# Patient Record
Sex: Male | Born: 1950 | Race: White | Hispanic: No | Marital: Married | Smoking: Former smoker
Health system: Southern US, Community
[De-identification: ages and names within clinical notes are randomized; demographics above are authoritative.]

## PROBLEM LIST (undated history)

## (undated) DIAGNOSIS — R0600 Dyspnea, unspecified: Secondary | ICD-10-CM

## (undated) DIAGNOSIS — D649 Anemia, unspecified: Secondary | ICD-10-CM

## (undated) DIAGNOSIS — D72828 Other elevated white blood cell count: Secondary | ICD-10-CM

## (undated) DIAGNOSIS — J189 Pneumonia, unspecified organism: Secondary | ICD-10-CM

## (undated) DIAGNOSIS — R911 Solitary pulmonary nodule: Secondary | ICD-10-CM

## (undated) DIAGNOSIS — Z720 Tobacco use: Secondary | ICD-10-CM

## (undated) DIAGNOSIS — J449 Chronic obstructive pulmonary disease, unspecified: Secondary | ICD-10-CM

## (undated) DIAGNOSIS — E785 Hyperlipidemia, unspecified: Secondary | ICD-10-CM

## (undated) HISTORY — PX: TONSILLECTOMY: SUR1361

---

## 1998-07-24 HISTORY — PX: BACK SURGERY: SHX140

## 2005-10-06 ENCOUNTER — Ambulatory Visit: Payer: Self-pay

## 2007-02-12 ENCOUNTER — Ambulatory Visit: Payer: Self-pay | Admitting: Surgery

## 2008-04-27 ENCOUNTER — Ambulatory Visit: Payer: Self-pay

## 2009-01-16 ENCOUNTER — Ambulatory Visit: Payer: Self-pay | Admitting: Unknown Physician Specialty

## 2009-05-06 ENCOUNTER — Ambulatory Visit: Payer: Self-pay | Admitting: Unknown Physician Specialty

## 2009-05-13 ENCOUNTER — Inpatient Hospital Stay: Payer: Self-pay | Admitting: Unknown Physician Specialty

## 2010-07-24 HISTORY — PX: NECK SURGERY: SHX720

## 2017-06-02 DIAGNOSIS — R69 Illness, unspecified: Secondary | ICD-10-CM | POA: Diagnosis not present

## 2017-06-26 DIAGNOSIS — Z0101 Encounter for examination of eyes and vision with abnormal findings: Secondary | ICD-10-CM | POA: Diagnosis not present

## 2018-05-23 DIAGNOSIS — R69 Illness, unspecified: Secondary | ICD-10-CM | POA: Diagnosis not present

## 2018-07-30 ENCOUNTER — Other Ambulatory Visit: Payer: Self-pay

## 2018-07-30 ENCOUNTER — Ambulatory Visit (INDEPENDENT_AMBULATORY_CARE_PROVIDER_SITE_OTHER): Payer: Medicare HMO

## 2018-07-30 ENCOUNTER — Ambulatory Visit
Admission: EM | Admit: 2018-07-30 | Discharge: 2018-07-30 | Disposition: A | Discharge status: Home / Self Care | Payer: Medicare HMO | Attending: Family Medicine | Admitting: Family Medicine

## 2018-07-30 DIAGNOSIS — S161XXA Strain of muscle, fascia and tendon at neck level, initial encounter: Secondary | ICD-10-CM | POA: Diagnosis not present

## 2018-07-30 DIAGNOSIS — M542 Cervicalgia: Secondary | ICD-10-CM | POA: Diagnosis not present

## 2018-07-30 DIAGNOSIS — R031 Nonspecific low blood-pressure reading: Secondary | ICD-10-CM | POA: Insufficient documentation

## 2018-07-30 DIAGNOSIS — R05 Cough: Secondary | ICD-10-CM | POA: Diagnosis not present

## 2018-07-30 LAB — COMPREHENSIVE METABOLIC PANEL
ALBUMIN: 3.9 g/dL (ref 3.5–5.0)
ALT: 11 U/L (ref 0–44)
AST: 15 U/L (ref 15–41)
Alkaline Phosphatase: 58 U/L (ref 38–126)
Anion gap: 9 (ref 5–15)
BUN: 17 mg/dL (ref 8–23)
CO2: 28 mmol/L (ref 22–32)
Calcium: 9.1 mg/dL (ref 8.9–10.3)
Chloride: 100 mmol/L (ref 98–111)
Creatinine, Ser: 1.1 mg/dL (ref 0.61–1.24)
GFR calc Af Amer: >60 mL/min (ref >60)
GFR calc non Af Amer: >60 mL/min (ref >60)
Glucose, Bld: 150 mg/dL — ABNORMAL HIGH (ref 70–99)
POTASSIUM: 3.9 mmol/L (ref 3.5–5.1)
Sodium: 137 mmol/L (ref 135–145)
Total Bilirubin: 1.1 mg/dL (ref 0.3–1.2)
Total Protein: 7.1 g/dL (ref 6.5–8.1)

## 2018-07-30 LAB — CBC WITH DIFFERENTIAL/PLATELET
Abs Immature Granulocytes: 0.03 10*3/uL (ref 0.00–0.07)
Basophils Absolute: 0.1 10*3/uL (ref 0.0–0.1)
Basophils Relative: 1 %
Eosinophils Absolute: 0.1 10*3/uL (ref 0.0–0.5)
Eosinophils Relative: 1 %
HCT: 37.1 % — ABNORMAL LOW (ref 39.0–52.0)
Hemoglobin: 12 g/dL — ABNORMAL LOW (ref 13.0–17.0)
IMMATURE GRANULOCYTES: 0 %
LYMPHS ABS: 1.5 10*3/uL (ref 0.7–4.0)
Lymphocytes Relative: 15 %
MCH: 31.3 pg (ref 26.0–34.0)
MCHC: 32.3 g/dL (ref 30.0–36.0)
MCV: 96.6 fL (ref 80.0–100.0)
Monocytes Absolute: 0.8 10*3/uL (ref 0.1–1.0)
Monocytes Relative: 8 %
NEUTROS PCT: 75 %
Neutro Abs: 7.5 10*3/uL (ref 1.7–7.7)
Platelets: 197 10*3/uL (ref 150–400)
RBC: 3.84 MIL/uL — ABNORMAL LOW (ref 4.22–5.81)
RDW: 12.7 % (ref 11.5–15.5)
WBC: 10 10*3/uL (ref 4.0–10.5)
nRBC: 0 % (ref 0.0–0.2)

## 2018-07-30 MED ORDER — KETOROLAC TROMETHAMINE 60 MG/2ML IM SOLN
60.0000 mg | Freq: Once | INTRAMUSCULAR | Status: AC
  Administered 2018-07-30: 60 mg via INTRAMUSCULAR

## 2018-07-30 MED ORDER — CYCLOBENZAPRINE HCL 10 MG PO TABS: 10.0000 mg | ORAL_TABLET | Freq: Three times a day (TID) | ORAL | 0 refills | Status: DC | PRN

## 2018-07-30 NOTE — ED Triage Notes (Signed)
Patient complains of neck pain that started 2-3 days ago. States that he thought he had slept on it wrong, decreased ROM, states that pain has continued to worsen.

## 2018-07-30 NOTE — ED Provider Notes (Addendum)
[facility]    CSN: [id number] Arrival date & time: 07/30/18  7829     History   Chief Complaint Chief Complaint  Patient presents with  . Neck Pain    HPI Luis Haney is a 68 y.o. male.   68 yo male with a c/o back neck pain for the past 2-3 days. States he woke up with it. Denies any falls or injuries, fevers, chills, chest pains, arm pain, jaw pain, shortness of breath. Pain is worse with movement or positioning of neck certain ways.   The history is provided by the patient.    History reviewed. No pertinent past medical history.  There are no active problems to display for this patient.   Past Surgical History:  Procedure Laterality Date  . BACK SURGERY  2000  . NECK SURGERY  2012       Home Medications    Prior to Admission medications   Medication Sig Start Date End Date Taking? Authorizing Provider  calcium carbonate (OS-CAL - DOSED IN MG OF ELEMENTAL CALCIUM) 1250 (500 Ca) MG tablet Take 1 tablet by mouth.   Yes [provider]  Multiple Vitamin (MULTI VITAMIN MENS PO) Take by mouth.   Yes [provider]  cyclobenzaprine (FLEXERIL) 10 MG tablet Take 1 tablet (10 mg total) by mouth 3 (three) times daily as needed for muscle spasms. 07/30/18   Norval Gable, MD    Family History History reviewed. No pertinent family history.  Social History Social History   Tobacco Use  . Smoking status: Current Every Day Smoker    Packs/day: 0.25    Types: Cigarettes  . Smokeless tobacco: Never Used  Substance Use Topics  . Alcohol use: Yes    Comment: beer occasionally  . Drug use: Not Currently     Allergies   Patient has no known allergies.   Review of Systems Review of Systems   Physical Exam Triage Vital Signs ED Triage Vitals  Enc Vitals Group     BP 07/30/18 0931 (!) 74/44     Pulse Rate 07/30/18 0931 (!) 53     Resp 07/30/18 0931 18     Temp 07/30/18 0931 97.8 F (36.6 C)     Temp Source 07/30/18 0931  Oral     SpO2 07/30/18 0931 95 %     Weight 07/30/18 0928 202 lb (91.6 kg)     Height 07/30/18 0928 6\' 1"  (1.854 m)     Head Circumference --      Peak Flow --      Pain Score 07/30/18 0928 9     Pain Loc --      Pain Edu? --      Excl. in [place]? --    No data found.  Updated Vital Signs BP (!) 86/50 (BP Location: Right Arm)   Pulse (!) 53   Temp 97.8 F (36.6 C) (Oral)   Resp 18   Ht 6\' 1"  (1.854 m)   Wt 91.6 kg   SpO2 95%   BMI 26.65 kg/m   Visual Acuity Right Eye Distance:   Left Eye Distance:   Bilateral Distance:    Right Eye Near:   Left Eye Near:    Bilateral Near:     Physical Exam Vitals signs and nursing note reviewed.  Constitutional:      General: He is not in acute distress.    Appearance: He is well-developed. He is not toxic-appearing or diaphoretic.  HENT:  Head: Normocephalic and atraumatic.     Nose: Nose normal.     Mouth/Throat:     Pharynx: Uvula midline. No oropharyngeal exudate.     Tonsils: No tonsillar abscesses.  Eyes:     General: No scleral icterus.       Right eye: No discharge.        Left eye: No discharge.  Neck:     Musculoskeletal: Normal range of motion and neck supple.     Thyroid: No thyromegaly.     Trachea: No tracheal deviation.  Cardiovascular:     Rate and Rhythm: Normal rate and regular rhythm.     Heart sounds: Normal heart sounds.  Pulmonary:     Effort: Pulmonary effort is normal. No respiratory distress.     Breath sounds: Normal breath sounds. No stridor. No wheezing, rhonchi or rales.  Chest:     Chest wall: No tenderness.  Lymphadenopathy:     Cervical: No cervical adenopathy.  Skin:    General: Skin is warm and dry.     Findings: No rash.  Neurological:     Mental Status: He is alert.      UC Treatments / Results  Labs (all labs ordered are listed, but only abnormal results are displayed) Labs Reviewed  CBC WITH DIFFERENTIAL/PLATELET - Abnormal; Notable for the following components:       Result Value   RBC 3.84 (*)    Hemoglobin 12.0 (*)    HCT 37.1 (*)    All other components within normal limits  COMPREHENSIVE METABOLIC PANEL - Abnormal; Notable for the following components:   Glucose, Bld 150 (*)    All other components within normal limits    EKG None  Radiology Dg Chest 2 View  Result Date: 07/30/2018 CLINICAL DATA:  Cough chills fatigue EXAM: CHEST - 2 VIEW COMPARISON:  05/06/2009 FINDINGS: The heart size and mediastinal contours are within normal limits. Both lungs are clear. The visualized skeletal structures are unremarkable. IMPRESSION: No active cardiopulmonary disease. Electronically Signed   By: Franchot Gallo M.D.   On: 07/30/2018 10:27   Dg Cervical Spine Complete  Result Date: 07/30/2018 CLINICAL DATA:  Neck pain, recent cold symptoms, initial encounter EXAM: CERVICAL SPINE - COMPLETE 4+ VIEW COMPARISON:  None. FINDINGS: Seven cervical segments are well visualized. Vertebral body height is well maintained. Changes of prior fusion at C5-6 and C6-7 are noted with anterior fixation. The neural foramina demonstrate mild narrowing bilaterally secondary to osteophytic change and facet hypertrophic change. No soft tissue abnormality is noted. The odontoid is within normal limits. IMPRESSION: Postsurgical and degenerative changes with mild neural foraminal narrowing. No acute abnormality noted. Electronically Signed   By: Inez Catalina M.D.   On: 07/30/2018 10:29    Procedures ED EKG Date/Time: 07/30/2018 9:28 PM Performed by: Norval Gable, MD Authorized by: Norval Gable, MD   ECG reviewed by ED Physician in the absence of a cardiologist: yes   Previous ECG:    Previous ECG:  Unavailable Interpretation:    Interpretation: normal   Rate:    ECG rate assessment: bradycardic   Rhythm:    Rhythm: sinus bradycardia   Ectopy:    Ectopy: none   QRS:    QRS axis:  Normal Conduction:    Conduction: normal   ST segments:    ST segments:  Normal T waves:     T waves: normal     (including critical care time)  Medications Ordered in UC Medications  ketorolac (TORADOL) injection 60 mg (60 mg Intramuscular Given 07/30/18 1049)    Initial Impression / Assessment and Plan / UC Course  I have reviewed the triage vital signs and the nursing notes.  Pertinent labs & imaging results that were available during my care of the patient were reviewed by me and considered in my medical decision making (see chart for details).      Final Clinical Impressions(s) / UC Diagnoses   Final diagnoses:  Acute strain of neck muscle, initial encounter  Low blood pressure reading     Discharge Instructions     Follow up with Primary Care provider this week Go to Emergency Department if symptoms worsen or new symptoms develop (reviewed)    ED Prescriptions    Medication Sig Dispense Auth. Provider   cyclobenzaprine (FLEXERIL) 10 MG tablet Take 1 tablet (10 mg total) by mouth 3 (three) times daily as needed for muscle spasms. 30 tablet Norval Gable, MD     1. Labs/ekg results and diagnoses reviewed with patient; given toradol 60mg  IM with improvement of symptoms 2. rx as per orders above; reviewed possible side effects, interactions, risks and benefits  3. Follow-up prn if symptoms worsen or don't improve   Controlled Substance Prescriptions [place] Controlled Substance Registry consulted? Not Applicable   Norval Gable, MD 07/30/18 2131    Norval Gable, MD 07/30/18 2133

## 2018-07-30 NOTE — Discharge Instructions (Addendum)
Follow up with Primary Care provider this week Go to Emergency Department if symptoms worsen or new symptoms develop (reviewed)

## 2018-12-17 ENCOUNTER — Telehealth: Payer: Self-pay | Admitting: Internal Medicine

## 2018-12-17 ENCOUNTER — Other Ambulatory Visit: Payer: Medicare HMO

## 2018-12-17 DIAGNOSIS — Z20822 Contact with and (suspected) exposure to covid-19: Secondary | ICD-10-CM

## 2018-12-17 DIAGNOSIS — R6889 Other general symptoms and signs: Secondary | ICD-10-CM | POA: Diagnosis not present

## 2018-12-17 NOTE — Telephone Encounter (Signed)
Per [employer] health department

## 2018-12-18 LAB — NOVEL CORONAVIRUS, NAA: SARS-CoV-2, NAA: NOT DETECTED

## 2019-04-29 DIAGNOSIS — R69 Illness, unspecified: Secondary | ICD-10-CM | POA: Diagnosis not present

## 2019-07-25 DIAGNOSIS — Z8709 Personal history of other diseases of the respiratory system: Secondary | ICD-10-CM

## 2019-07-25 HISTORY — DX: Personal history of other diseases of the respiratory system: Z87.09

## 2020-02-26 ENCOUNTER — Other Ambulatory Visit: Payer: Self-pay

## 2020-02-26 ENCOUNTER — Ambulatory Visit
Admission: EM | Admit: 2020-02-26 | Discharge: 2020-02-26 | Disposition: A | Discharge status: Home / Self Care | Payer: Medicare HMO | Attending: Family Medicine | Admitting: Family Medicine

## 2020-02-26 DIAGNOSIS — Z23 Encounter for immunization: Secondary | ICD-10-CM | POA: Diagnosis not present

## 2020-02-26 DIAGNOSIS — S61412A Laceration without foreign body of left hand, initial encounter: Secondary | ICD-10-CM

## 2020-02-26 MED ORDER — TETANUS-DIPHTH-ACELL PERTUSSIS 5-2.5-18.5 LF-MCG/0.5 IM SUSP
0.5000 mL | Freq: Once | INTRAMUSCULAR | Status: AC
  Administered 2020-02-26: 0.5 mL via INTRAMUSCULAR

## 2020-02-26 NOTE — ED Provider Notes (Signed)
[facility]    CSN: [id number] Arrival date & time: 02/26/20  1421      History   Chief Complaint Chief Complaint  Patient presents with  . Laceration   HPI  69 year old male presents with a hand laceration.  Patient states that he was opening up a package of some touchup paint today.  He was cutting an open with a knife and inadvertently cut his left palm.  Bleeding well controlled.  Denies pain at this time.  Believes that he needs stitches.  Unsure last tetanus.  No other associated symptoms.  No other complaints.  Past Surgical History:  Procedure Laterality Date  . BACK SURGERY  2000  . NECK SURGERY  2012   Home Medications    Prior to Admission medications   Medication Sig Start Date End Date Taking? Authorizing Provider  calcium carbonate (OS-CAL - DOSED IN MG OF ELEMENTAL CALCIUM) 1250 (500 Ca) MG tablet Take 1 tablet by mouth.   Yes [provider]  Multiple Vitamin (MULTI VITAMIN MENS PO) Take by mouth.   Yes [provider]  cyclobenzaprine (FLEXERIL) 10 MG tablet Take 1 tablet (10 mg total) by mouth 3 (three) times daily as needed for muscle spasms. 07/30/18   Norval Gable, MD   Social History Social History   Tobacco Use  . Smoking status: Current Every Day Smoker    Packs/day: 0.25    Types: Cigarettes  . Smokeless tobacco: Never Used  Vaping Use  . Vaping Use: Never used  Substance Use Topics  . Alcohol use: Yes    Comment: beer occasionally  . Drug use: Not Currently     Allergies   Patient has no known allergies.   Review of Systems Review of Systems  Constitutional: Negative.   Skin: Positive for wound.   Physical Exam Triage Vital Signs ED Triage Vitals [02/26/20 1447]  Enc Vitals Group     BP 135/72     Pulse Rate 71     Resp 18     Temp 98 F (36.7 C)     Temp Source Oral     SpO2 100 %     Weight 195 lb (88.5 kg)     Height 6\' 1"  (1.854 m)     Head Circumference      Peak Flow      Pain Score  0     Pain Loc      Pain Edu?      Excl. in [place]?    Updated Vital Signs BP 135/72 (BP Location: Left Arm)   Pulse 71   Temp 98 F (36.7 C) (Oral)   Resp 18   Ht 6\' 1"  (1.854 m)   Wt 88.5 kg   SpO2 100%   BMI 25.73 kg/m   Visual Acuity Right Eye Distance:   Left Eye Distance:   Bilateral Distance:    Right Eye Near:   Left Eye Near:    Bilateral Near:     Physical Exam   UC Treatments / Results  Labs (all labs ordered are listed, but only abnormal results are displayed) Labs Reviewed - No data to display  EKG   Radiology No results found.  Procedures Laceration Repair  Date/Time: 02/26/2020 8:41 PM Performed by: Coral Spikes, DO Authorized by: Coral Spikes, DO   Consent:    Consent obtained:  Verbal   Consent given by:  Patient Anesthesia (see MAR for exact dosages):    Anesthesia method:  Local infiltration   Local anesthetic:  Lidocaine 1% WITH epi Laceration details:    Location:  Hand   Hand location:  L palm   Length (cm):  1.5 Repair type:    Repair type:  Simple Pre-procedure details:    Preparation:  Patient was prepped and draped in usual sterile fashion Exploration:    Hemostasis achieved with:  Direct pressure   Wound exploration: entire depth of wound probed and visualized     Contaminated: no   Treatment:    Area cleansed with:  Betadine   Irrigation solution:  Sterile water Skin repair:    Repair method:  Sutures   Suture size:  5-0   Suture material:  Prolene   Suture technique:  Simple interrupted   Number of sutures:  4 Approximation:    Approximation:  Close Post-procedure details:    Dressing:  Non-adherent dressing   (including critical care time)  Medications Ordered in UC Medications  Tdap (BOOSTRIX) injection 0.5 mL (0.5 mLs Intramuscular Given 02/26/20 1456)    Initial Impression / Assessment and Plan / UC Course  I have reviewed the triage vital signs and the nursing notes.  Pertinent labs & imaging results  that were available during my care of the patient were reviewed by me and considered in my medical decision making (see chart for details).    26-year-old male presents with laceration.  Repaired as above.  Sutures out in 7 days.  Supportive care.  Tetanus given today.  Final Clinical Impressions(s) / UC Diagnoses   Final diagnoses:  Laceration of left hand without foreign body, initial encounter     Discharge Instructions     Keep clean.  Sutures out in 7 days.  Take care  Dr. Lacinda Axon    ED Prescriptions    None     PDMP not reviewed this encounter.   Coral Spikes, [place] 02/26/20 2045

## 2020-02-26 NOTE — ED Triage Notes (Signed)
Patient states that he cut his left hand while opening a package with his pocket knife around 1030am.

## 2020-02-26 NOTE — Discharge Instructions (Signed)
Keep clean.  Sutures out in 7 days.  Take care  Dr. Lacinda Axon

## 2020-03-05 ENCOUNTER — Ambulatory Visit
Admission: EM | Admit: 2020-03-05 | Discharge: 2020-03-05 | Disposition: A | Discharge status: Home / Self Care | Payer: Medicare HMO

## 2020-03-05 DIAGNOSIS — Z5189 Encounter for other specified aftercare: Secondary | ICD-10-CM | POA: Diagnosis not present

## 2020-03-05 DIAGNOSIS — Z4802 Encounter for removal of sutures: Secondary | ICD-10-CM

## 2020-03-05 NOTE — ED Provider Notes (Signed)
[facility]    CSN: [id number] Arrival date & time: 03/05/20  1660      History   Chief Complaint Chief Complaint  Patient presents with  . Suture / Staple Removal    HPI Luis Haney is a 69 y.o. male.   Pt had sutures placed on 8/5 for small laceration to LT inner palm near thumb area. He states yesterday he used his mower and thought there was redness to the area and wanted to make sure no abx of infection was in the area before sutures removed. Denies any fever. Just soreness when touching.       History reviewed. No pertinent past medical history.  There are no problems to display for this patient.   Past Surgical History:  Procedure Laterality Date  . BACK SURGERY  2000  . NECK SURGERY  2012       Home Medications    Prior to Admission medications   Medication Sig Start Date End Date Taking? Authorizing Provider  calcium carbonate (OS-CAL - DOSED IN MG OF ELEMENTAL CALCIUM) 1250 (500 Ca) MG tablet Take 1 tablet by mouth.   Yes [provider]  cyclobenzaprine (FLEXERIL) 10 MG tablet Take 1 tablet (10 mg total) by mouth 3 (three) times daily as needed for muscle spasms. 07/30/18  Yes Norval Gable, MD  Multiple Vitamin (MULTI VITAMIN MENS PO) Take by mouth.   Yes [provider]    Family History History reviewed. No pertinent family history.  Social History Social History   Tobacco Use  . Smoking status: Current Every Day Smoker    Packs/day: 0.25    Types: Cigarettes  . Smokeless tobacco: Never Used  Vaping Use  . Vaping Use: Never used  Substance Use Topics  . Alcohol use: Yes    Comment: beer occasionally  . Drug use: Not Currently     Allergies   Patient has no known allergies.   Review of Systems Review of Systems  Constitutional: Negative.   Musculoskeletal: Negative.   Skin:       Sutures in hand soreness.   Neurological: Negative.      Physical Exam Triage Vital Signs ED Triage Vitals  [03/05/20 0841]  Enc Vitals Group     BP      Pulse      Resp      Temp      Temp src      SpO2      Weight      Height      Head Circumference      Peak Flow      Pain Score 2     Pain Loc      Pain Edu?      Excl. in [place]?    No data found.  Updated Vital Signs BP 111/66 (BP Location: Left Arm)   Pulse 60   Temp 98.2 F (36.8 C) (Oral)   Ht 6\' 1"  (1.854 m)   Wt 197 lb (89.4 kg)   SpO2 97%   BMI 25.99 kg/m   Visual Acuity     Physical Exam Musculoskeletal:        General: Tenderness present.     Comments: Lt inner palm 3 sutures in place. incision approximated well, no erythema, healing well.   Skin:    Comments: Incision healing well.   Neurological:     Mental Status: He is alert.      UC Treatments / Results  Labs (  all labs ordered are listed, but only abnormal results are displayed) Labs Reviewed - No data to display  EKG   Radiology No results found.  Procedures Procedures (including critical care time)  Medications Ordered in UC Medications - No data to display  Initial Impression / Assessment and Plan / UC Course  I have reviewed the triage vital signs and the nursing notes.  Pertinent labs & imaging results that were available during my care of the patient were reviewed by me and considered in my medical decision making (see chart for details).    Keep area clean.  Final Clinical Impressions(s) / UC Diagnoses   Final diagnoses:  Visit for suture removal  Visit for wound check   Discharge Instructions   None    ED Prescriptions    None     PDMP not reviewed this encounter.   Marney Setting, NP 03/05/20 [phone]

## 2020-03-05 NOTE — ED Triage Notes (Signed)
Patient in today for concerns of infected suture site. Due for suture removal today.

## 2020-03-24 DIAGNOSIS — R69 Illness, unspecified: Secondary | ICD-10-CM | POA: Diagnosis not present

## 2020-04-14 DIAGNOSIS — R69 Illness, unspecified: Secondary | ICD-10-CM | POA: Diagnosis not present

## 2020-05-26 DIAGNOSIS — R69 Illness, unspecified: Secondary | ICD-10-CM | POA: Diagnosis not present

## 2020-06-23 DIAGNOSIS — J90 Pleural effusion, not elsewhere classified: Secondary | ICD-10-CM

## 2020-06-23 HISTORY — PX: CHEST TUBE INSERTION: SHX231

## 2020-06-23 HISTORY — DX: Pleural effusion, not elsewhere classified: J90

## 2020-06-28 ENCOUNTER — Emergency Department: Payer: Medicare HMO

## 2020-06-28 ENCOUNTER — Inpatient Hospital Stay
Admission: EM | Admit: 2020-06-28 | Discharge: 2020-07-09 | DRG: 871 | Disposition: A | Discharge status: Other Acute Inpatient Hospital | Payer: Medicare HMO | Attending: Internal Medicine | Admitting: Internal Medicine

## 2020-06-28 ENCOUNTER — Other Ambulatory Visit: Payer: Self-pay

## 2020-06-28 ENCOUNTER — Encounter: Payer: Self-pay | Admitting: Emergency Medicine

## 2020-06-28 ENCOUNTER — Ambulatory Visit
Admission: EM | Admit: 2020-06-28 | Discharge: 2020-06-28 | Disposition: A | Discharge status: Other Acute Inpatient Hospital | Payer: Medicare HMO

## 2020-06-28 ENCOUNTER — Observation Stay: Payer: Medicare HMO

## 2020-06-28 DIAGNOSIS — R509 Fever, unspecified: Secondary | ICD-10-CM

## 2020-06-28 DIAGNOSIS — R918 Other nonspecific abnormal finding of lung field: Secondary | ICD-10-CM | POA: Diagnosis not present

## 2020-06-28 DIAGNOSIS — D729 Disorder of white blood cells, unspecified: Secondary | ICD-10-CM

## 2020-06-28 DIAGNOSIS — R059 Cough, unspecified: Secondary | ICD-10-CM

## 2020-06-28 DIAGNOSIS — J9601 Acute respiratory failure with hypoxia: Secondary | ICD-10-CM | POA: Diagnosis not present

## 2020-06-28 DIAGNOSIS — R000 Tachycardia, unspecified: Secondary | ICD-10-CM | POA: Diagnosis not present

## 2020-06-28 DIAGNOSIS — F17200 Nicotine dependence, unspecified, uncomplicated: Secondary | ICD-10-CM | POA: Diagnosis present

## 2020-06-28 DIAGNOSIS — A419 Sepsis, unspecified organism: Principal | ICD-10-CM | POA: Diagnosis present

## 2020-06-28 DIAGNOSIS — Z20822 Contact with and (suspected) exposure to covid-19: Secondary | ICD-10-CM | POA: Diagnosis present

## 2020-06-28 DIAGNOSIS — M545 Low back pain, unspecified: Secondary | ICD-10-CM | POA: Diagnosis not present

## 2020-06-28 DIAGNOSIS — G8929 Other chronic pain: Secondary | ICD-10-CM | POA: Diagnosis present

## 2020-06-28 DIAGNOSIS — J181 Lobar pneumonia, unspecified organism: Secondary | ICD-10-CM | POA: Diagnosis present

## 2020-06-28 DIAGNOSIS — R0602 Shortness of breath: Secondary | ICD-10-CM

## 2020-06-28 DIAGNOSIS — N179 Acute kidney failure, unspecified: Secondary | ICD-10-CM | POA: Diagnosis present

## 2020-06-28 DIAGNOSIS — G4700 Insomnia, unspecified: Secondary | ICD-10-CM | POA: Diagnosis present

## 2020-06-28 DIAGNOSIS — R5383 Other fatigue: Secondary | ICD-10-CM

## 2020-06-28 DIAGNOSIS — I959 Hypotension, unspecified: Secondary | ICD-10-CM

## 2020-06-28 DIAGNOSIS — J948 Other specified pleural conditions: Secondary | ICD-10-CM | POA: Diagnosis not present

## 2020-06-28 DIAGNOSIS — R0902 Hypoxemia: Secondary | ICD-10-CM

## 2020-06-28 DIAGNOSIS — R911 Solitary pulmonary nodule: Secondary | ICD-10-CM | POA: Diagnosis present

## 2020-06-28 DIAGNOSIS — J869 Pyothorax without fistula: Secondary | ICD-10-CM | POA: Diagnosis present

## 2020-06-28 DIAGNOSIS — R0689 Other abnormalities of breathing: Secondary | ICD-10-CM | POA: Diagnosis not present

## 2020-06-28 DIAGNOSIS — E440 Moderate protein-calorie malnutrition: Secondary | ICD-10-CM | POA: Insufficient documentation

## 2020-06-28 DIAGNOSIS — J90 Pleural effusion, not elsewhere classified: Secondary | ICD-10-CM | POA: Diagnosis not present

## 2020-06-28 DIAGNOSIS — R069 Unspecified abnormalities of breathing: Secondary | ICD-10-CM | POA: Diagnosis not present

## 2020-06-28 DIAGNOSIS — E860 Dehydration: Secondary | ICD-10-CM | POA: Diagnosis present

## 2020-06-28 DIAGNOSIS — I700 Atherosclerosis of aorta: Secondary | ICD-10-CM | POA: Diagnosis not present

## 2020-06-28 DIAGNOSIS — D72828 Other elevated white blood cell count: Secondary | ICD-10-CM

## 2020-06-28 DIAGNOSIS — R6520 Severe sepsis without septic shock: Secondary | ICD-10-CM | POA: Diagnosis present

## 2020-06-28 DIAGNOSIS — I2510 Atherosclerotic heart disease of native coronary artery without angina pectoris: Secondary | ICD-10-CM | POA: Diagnosis not present

## 2020-06-28 DIAGNOSIS — J918 Pleural effusion in other conditions classified elsewhere: Secondary | ICD-10-CM | POA: Diagnosis present

## 2020-06-28 DIAGNOSIS — J440 Chronic obstructive pulmonary disease with acute lower respiratory infection: Secondary | ICD-10-CM | POA: Diagnosis present

## 2020-06-28 DIAGNOSIS — J9811 Atelectasis: Secondary | ICD-10-CM | POA: Diagnosis not present

## 2020-06-28 DIAGNOSIS — F17210 Nicotine dependence, cigarettes, uncomplicated: Secondary | ICD-10-CM | POA: Diagnosis present

## 2020-06-28 LAB — URINALYSIS, COMPLETE (UACMP) WITH MICROSCOPIC
Bilirubin Urine: NEGATIVE
Glucose, UA: NEGATIVE mg/dL
Ketones, ur: NEGATIVE mg/dL
Leukocytes,Ua: NEGATIVE
Nitrite: NEGATIVE
Protein, ur: NEGATIVE mg/dL
Specific Gravity, Urine: 1.017 (ref 1.005–1.030)
pH: 5 (ref 5.0–8.0)

## 2020-06-28 LAB — COMPREHENSIVE METABOLIC PANEL
ALT: 11 U/L (ref 0–44)
AST: 17 U/L (ref 15–41)
Albumin: 2.8 g/dL — ABNORMAL LOW (ref 3.5–5.0)
Alkaline Phosphatase: 46 U/L (ref 38–126)
Anion gap: 12 (ref 5–15)
BUN: 55 mg/dL — ABNORMAL HIGH (ref 8–23)
CO2: 23 mmol/L (ref 22–32)
Calcium: 9 mg/dL (ref 8.9–10.3)
Chloride: 98 mmol/L (ref 98–111)
Creatinine, Ser: 3.44 mg/dL — ABNORMAL HIGH (ref 0.61–1.24)
GFR, Estimated: 18 mL/min — ABNORMAL LOW (ref >60)
Glucose, Bld: 131 mg/dL — ABNORMAL HIGH (ref 70–99)
Potassium: 4.7 mmol/L (ref 3.5–5.1)
Sodium: 133 mmol/L — ABNORMAL LOW (ref 135–145)
Total Bilirubin: 0.6 mg/dL (ref 0.3–1.2)
Total Protein: 6.4 g/dL — ABNORMAL LOW (ref 6.5–8.1)

## 2020-06-28 LAB — CBC WITH DIFFERENTIAL/PLATELET
Abs Immature Granulocytes: 0.11 10*3/uL — ABNORMAL HIGH (ref 0.00–0.07)
Basophils Absolute: 0.1 10*3/uL (ref 0.0–0.1)
Basophils Relative: 0 %
Eosinophils Absolute: 0.1 10*3/uL (ref 0.0–0.5)
Eosinophils Relative: 0 %
HCT: 35 % — ABNORMAL LOW (ref 39.0–52.0)
Hemoglobin: 11.5 g/dL — ABNORMAL LOW (ref 13.0–17.0)
Immature Granulocytes: 1 %
Lymphocytes Relative: 4 %
Lymphs Abs: 0.7 10*3/uL (ref 0.7–4.0)
MCH: 30.7 pg (ref 26.0–34.0)
MCHC: 32.9 g/dL (ref 30.0–36.0)
MCV: 93.6 fL (ref 80.0–100.0)
Monocytes Absolute: 1.8 10*3/uL — ABNORMAL HIGH (ref 0.1–1.0)
Monocytes Relative: 10 %
Neutro Abs: 14.8 10*3/uL — ABNORMAL HIGH (ref 1.7–7.7)
Neutrophils Relative %: 85 %
Platelets: 284 10*3/uL (ref 150–400)
RBC: 3.74 MIL/uL — ABNORMAL LOW (ref 4.22–5.81)
RDW: 13.4 % (ref 11.5–15.5)
Smear Review: NORMAL
WBC Morphology: INCREASED
WBC: 17.5 10*3/uL — ABNORMAL HIGH (ref 4.0–10.5)
nRBC: 0 % (ref 0.0–0.2)

## 2020-06-28 LAB — APTT: aPTT: 41 seconds — ABNORMAL HIGH (ref 24–36)

## 2020-06-28 LAB — RESP PANEL BY RT-PCR (FLU A&B, COVID) ARPGX2
Influenza A by PCR: NEGATIVE
Influenza B by PCR: NEGATIVE
SARS Coronavirus 2 by RT PCR: NEGATIVE

## 2020-06-28 LAB — PROTIME-INR
INR: 1.4 — ABNORMAL HIGH (ref 0.8–1.2)
Prothrombin Time: 16.3 seconds — ABNORMAL HIGH (ref 11.4–15.2)

## 2020-06-28 LAB — TSH: TSH: 2.96 u[IU]/mL (ref 0.350–4.500)

## 2020-06-28 LAB — PROTEIN, PLEURAL OR PERITONEAL FLUID: Total protein, fluid: 4.8 g/dL

## 2020-06-28 LAB — LACTIC ACID, PLASMA
Lactic Acid, Venous: 1.2 mmol/L (ref 0.5–1.9)
Lactic Acid, Venous: 1.5 mmol/L (ref 0.5–1.9)

## 2020-06-28 LAB — GLUCOSE, PLEURAL OR PERITONEAL FLUID: Glucose, Fluid: <20 mg/dL

## 2020-06-28 LAB — MAGNESIUM: Magnesium: 2 mg/dL (ref 1.7–2.4)

## 2020-06-28 LAB — LACTATE DEHYDROGENASE, PLEURAL OR PERITONEAL FLUID: LD, Fluid: 1970 U/L — ABNORMAL HIGH (ref 3–23)

## 2020-06-28 LAB — LACTATE DEHYDROGENASE: LDH: 116 U/L (ref 98–192)

## 2020-06-28 MED ORDER — ACETAMINOPHEN 325 MG PO TABS
325.0000 mg | ORAL_TABLET | Freq: Four times a day (QID) | ORAL | Status: DC | PRN
  Administered 2020-07-03: 325 mg via ORAL
  Filled 2020-06-28: qty 1

## 2020-06-28 MED ORDER — LIDOCAINE-EPINEPHRINE 2 %-1:100000 IJ SOLN: 20.0000 mL | Freq: Once | INTRAMUSCULAR | Status: DC

## 2020-06-28 MED ORDER — LACTATED RINGERS IV BOLUS
1000.0000 mL | Freq: Once | INTRAVENOUS | Status: AC
  Administered 2020-06-28: 1000 mL via INTRAVENOUS

## 2020-06-28 MED ORDER — SODIUM CHLORIDE 0.9 % IV SOLN
500.0000 mg | INTRAVENOUS | Status: DC
  Administered 2020-06-28 – 2020-06-30 (×3): 500 mg via INTRAVENOUS
  Filled 2020-06-28 (×3): qty 500

## 2020-06-28 MED ORDER — MIDAZOLAM HCL 2 MG/2ML IJ SOLN
INTRAMUSCULAR | Status: DC | PRN
  Administered 2020-06-28: 1 mg via INTRAVENOUS

## 2020-06-28 MED ORDER — ADULT MULTIVITAMIN W/MINERALS CH
1.0000 | ORAL_TABLET | Freq: Every day | ORAL | Status: DC
  Administered 2020-06-28 – 2020-07-09 (×12): 1 via ORAL
  Filled 2020-06-28 (×12): qty 1

## 2020-06-28 MED ORDER — FENTANYL CITRATE (PF) 100 MCG/2ML IJ SOLN
INTRAMUSCULAR | Status: AC
  Filled 2020-06-28: qty 2

## 2020-06-28 MED ORDER — MIDAZOLAM HCL 2 MG/2ML IJ SOLN
INTRAMUSCULAR | Status: AC
  Filled 2020-06-28: qty 2

## 2020-06-28 MED ORDER — FENTANYL CITRATE (PF) 100 MCG/2ML IJ SOLN
INTRAMUSCULAR | Status: DC | PRN
Start: 2020-06-28 — End: 2020-06-28
  Administered 2020-06-28: 50 ug via INTRAVENOUS

## 2020-06-28 MED ORDER — SODIUM CHLORIDE 0.9 % IV SOLN
2.0000 g | Freq: Once | INTRAVENOUS | Status: AC
  Administered 2020-06-28: 2 g via INTRAVENOUS
  Filled 2020-06-28: qty 2

## 2020-06-28 MED ORDER — ACETAMINOPHEN 500 MG PO TABS
1000.0000 mg | ORAL_TABLET | Freq: Once | ORAL | Status: AC
  Administered 2020-06-28: 1000 mg via ORAL
  Filled 2020-06-28: qty 2

## 2020-06-28 MED ORDER — VANCOMYCIN HCL IN DEXTROSE 1-5 GM/200ML-% IV SOLN
1000.0000 mg | INTRAVENOUS | Status: DC
  Administered 2020-06-29: 1000 mg via INTRAVENOUS
  Filled 2020-06-28: qty 200

## 2020-06-28 MED ORDER — ACETAMINOPHEN 650 MG RE SUPP: 325.0000 mg | Freq: Four times a day (QID) | RECTAL | Status: DC | PRN

## 2020-06-28 MED ORDER — SODIUM CHLORIDE 0.9 % IV SOLN
2.0000 g | INTRAVENOUS | Status: DC
  Administered 2020-06-29: 2 g via INTRAVENOUS
  Filled 2020-06-28: qty 0.1

## 2020-06-28 MED ORDER — VANCOMYCIN HCL 1250 MG/250ML IV SOLN
1250.0000 mg | Freq: Once | INTRAVENOUS | Status: AC
  Administered 2020-06-28: 1250 mg via INTRAVENOUS
  Filled 2020-06-28: qty 250

## 2020-06-28 MED ORDER — SODIUM CHLORIDE 0.9 % IV BOLUS
1000.0000 mL | Freq: Once | INTRAVENOUS | Status: AC
  Administered 2020-06-28: 1000 mL via INTRAVENOUS

## 2020-06-28 MED ORDER — ONDANSETRON HCL 4 MG PO TABS: 4.0000 mg | ORAL_TABLET | Freq: Four times a day (QID) | ORAL | Status: DC | PRN

## 2020-06-28 MED ORDER — HEPARIN SODIUM (PORCINE) 5000 UNIT/ML IJ SOLN
5000.0000 [IU] | Freq: Three times a day (TID) | INTRAMUSCULAR | Status: DC
  Administered 2020-06-28 – 2020-07-01 (×10): 5000 [IU] via SUBCUTANEOUS
  Filled 2020-06-28 (×10): qty 1

## 2020-06-28 MED ORDER — ONDANSETRON HCL 4 MG/2ML IJ SOLN: 4.0000 mg | Freq: Four times a day (QID) | INTRAMUSCULAR | Status: DC | PRN

## 2020-06-28 NOTE — ED Provider Notes (Signed)
[facility] Emergency Department Provider Note ____________________________________________   First MD Initiated Contact with Patient 06/28/20 [phone]     (approximate)  I have reviewed the triage vital signs and the nursing notes.  HISTORY  Chief Complaint Shortness of Breath (3 days) and Fever   HPI Luis Haney is a 69 y.o. malewho presents to the ED for evaluation of shortness of breath and fever.  Chart review indicates patient was seen at urgent care this morning with same complaints.  Noted be tachycardic, hypotensive and sats in the 80s so EMS was called for ED evaluation.  Patient reports being a lifelong smoker and currently smoking about one half PPD. Reports accidental mechanical fall that occurred 2 weeks ago where he fell from a 4 step ladder onto his right\flank.   Patient reports he has been ambulatory since then, with mild pain to his right flank that is constant and aching in nature. Patient is reporting acutely about 3 days of subjective fevers, minimally productive cough, shortness of breath and increasing pain to his right side.   No past medical history on file.  Patient Active Problem List   Diagnosis Date Noted  . Acute hypoxemic respiratory failure ([place]) 06/28/2020    Past Surgical History:  Procedure Laterality Date  . BACK SURGERY  2000  . NECK SURGERY  2012    Prior to Admission medications   Medication Sig Start Date End Date Taking? Authorizing Provider  calcium carbonate (OS-CAL - DOSED IN MG OF ELEMENTAL CALCIUM) 1250 (500 Ca) MG tablet Take 1 tablet by mouth.    [provider]  cyclobenzaprine (FLEXERIL) 10 MG tablet Take 1 tablet (10 mg total) by mouth 3 (three) times daily as needed for muscle spasms. 07/30/18   Norval Gable, MD  Multiple Vitamin (MULTI VITAMIN MENS PO) Take by mouth.    [provider]  POTASSIUM PO Take 1 tablet by mouth daily.    [provider]     Allergies Patient has no known allergies.  Family History  Problem Relation Age of Onset  . Heart disease Mother   . Other Father        "old age"    Social History Social History   Tobacco Use  . Smoking status: Current Every Day Smoker    Packs/day: 0.25    Types: Cigarettes  . Smokeless tobacco: Never Used  Vaping Use  . Vaping Use: Never used  Substance Use Topics  . Alcohol use: Yes    Comment: beer occasionally  . Drug use: Not Currently    Review of Systems  Constitutional: Positive for subjective fever/chills Eyes: No visual changes. ENT: No sore throat. Cardiovascular: Positive for chest and right-sided flank pain. Respiratory: Positive for shortness of breath and cough. Gastrointestinal: No abdominal pain.  No nausea, no vomiting.  No diarrhea.  No constipation. Genitourinary: Negative for dysuria. Musculoskeletal: Positive for back pain  Skin: Negative for rash. Neurological: Negative for headaches, focal weakness or numbness.   ____________________________________________   PHYSICAL EXAM:  VITAL SIGNS: Vitals:   06/28/20 1100 06/28/20 1200  BP: (!) 92/52 (!) 104/56  Pulse: 96 94  Resp: (!) 30 (!) 32  Temp:    SpO2: 96% 95%     Constitutional: Alert and oriented.  Sitting upright in bed and appears uncomfortable.  Obviously tachypneic, and only speaking in phrases due to this.  Warm to the touch. Eyes: Conjunctivae are normal. PERRL. EOMI. Head: Atraumatic. Nose: No congestion/rhinnorhea. Mouth/Throat: Mucous  membranes are dry.  Oropharynx non-erythematous. Neck: No stridor. No cervical spine tenderness to palpation. Cardiovascular: Tachycardic, regular rhythm. Grossly normal heart sounds.  Good peripheral circulation. Respiratory: Tachypneic to about 30 with absent breath sounds in the right.  Gastrointestinal: Soft , nondistended, nontender to palpation. No CVA tenderness. Musculoskeletal: No lower extremity tenderness nor edema.  No  joint effusions. No signs of acute trauma. Neurologic:  Normal speech and language. No gross focal neurologic deficits are appreciated. No gait instability noted. Skin:  Skin is warm, dry and intact. No rash noted. Psychiatric: Mood and affect are normal. Speech and behavior are normal.  ____________________________________________   LABS (all labs ordered are listed, but only abnormal results are displayed)  Labs Reviewed  COMPREHENSIVE METABOLIC PANEL - Abnormal; Notable for the following components:      Result Value   Sodium 133 (*)    Glucose, Bld 131 (*)    BUN 55 (*)    Creatinine, Ser 3.44 (*)    Total Protein 6.4 (*)    Albumin 2.8 (*)    GFR, Estimated 18 (*)    All other components within normal limits  CBC WITH DIFFERENTIAL/PLATELET - Abnormal; Notable for the following components:   WBC 17.5 (*)    RBC 3.74 (*)    Hemoglobin 11.5 (*)    HCT 35.0 (*)    Neutro Abs 14.8 (*)    Monocytes Absolute 1.8 (*)    Abs Immature Granulocytes 0.11 (*)    All other components within normal limits  PROTIME-INR - Abnormal; Notable for the following components:   Prothrombin Time 16.3 (*)    INR 1.4 (*)    All other components within normal limits  APTT - Abnormal; Notable for the following components:   aPTT 41 (*)    All other components within normal limits  RESP PANEL BY RT-PCR (FLU A&B, COVID) ARPGX2  URINE CULTURE  CULTURE, BLOOD (ROUTINE X 2)  CULTURE, BLOOD (ROUTINE X 2)  LACTIC ACID, PLASMA  LACTIC ACID, PLASMA  MAGNESIUM  URINALYSIS, COMPLETE (UACMP) WITH MICROSCOPIC   ____________________________________________  12 Lead EKG  Rhythm, rate of 88 bpm.  Normal axis, normal intervals.  No evidence of acute ischemia. ____________________________________________  RADIOLOGY  ED MD interpretation: 1 view CXR reviewed by me with large right-sided pleural effusion  Official radiology report(s): CT CHEST WO CONTRAST  Result Date: 06/28/2020 CLINICAL DATA:   Abnormal chest x-ray, large effusion EXAM: CT CHEST WITHOUT CONTRAST TECHNIQUE: Multidetector CT imaging of the chest was performed following the standard protocol without IV contrast. COMPARISON:  Chest x-ray earlier today FINDINGS: Cardiovascular: Calcifications in the aorta and coronary arteries. Heart is normal size. Aorta is normal caliber. Mediastinum/Nodes: No mediastinal, hilar, or axillary adenopathy. Trachea and esophagus are unremarkable. Thyroid unremarkable. Lungs/Pleura: Large right pleural effusion loculated laterally. Airspace disease throughout the right lung could reflect compressive atelectasis or pneumonia. This is most confluent in the right lower lobe. Spiculated appearing nodule in the aerated right upper lobe on image 30 measures 10 mm. No confluent opacity or effusion on the left. Upper Abdomen: Imaging into the upper abdomen demonstrates no acute findings. Musculoskeletal: Chest wall soft tissues are unremarkable. No acute bony abnormality. IMPRESSION: Large loculated right pleural effusion with compressive atelectasis versus infiltrate/pneumonia in the right lung, particularly right lower lobe. Spiculated 10 mm right upper lobe nodule. This warrants further investigation. Consider PET CT after clearance of acute symptoms for further evaluation. Coronary artery disease. Aortic Atherosclerosis (ICD10-I70.0). Electronically Signed  By: Rolm Baptise M.D.   On: 06/28/2020 11:47   DG Chest Port 1 View  Result Date: 06/28/2020 CLINICAL DATA:  Cough, right-sided back pain EXAM: PORTABLE CHEST 1 VIEW COMPARISON:  January 2020 FINDINGS: New large right pleural effusion with associated atelectasis. Left lung is clear. No pneumothorax. Heart size is normal. Mild leftward mediastinal shift. Cervicothoracic fusion. IMPRESSION: New large right pleural effusion with associated atelectasis. Underlying pneumonia is not excluded. Electronically Signed   By: Macy Mis M.D.   On: 06/28/2020 10:09     ____________________________________________   PROCEDURES and INTERVENTIONS  Procedure(s) performed (including Critical Care):  .1-3 Lead EKG Interpretation Performed by: Vladimir Crofts, MD Authorized by: Vladimir Crofts, MD     Interpretation: abnormal     ECG rate:  108   ECG rate assessment: tachycardic     Rhythm: sinus tachycardia     Ectopy: none     Conduction: normal   .Critical Care Performed by: Vladimir Crofts, MD Authorized by: Vladimir Crofts, MD   Critical care provider statement:    Critical care time (minutes):  45   Critical care was necessary to treat or prevent imminent or life-threatening deterioration of the following conditions:  Sepsis, respiratory failure and dehydration   Critical care was time spent personally by me on the following activities:  Discussions with consultants, evaluation of patient's response to treatment, examination of patient, ordering and performing treatments and interventions, ordering and review of laboratory studies, ordering and review of radiographic studies, pulse oximetry, re-evaluation of patient's condition, obtaining history from patient or surrogate and review of old charts    Medications  lactated ringers bolus 1,000 mL (1,000 mLs Intravenous Incomplete 06/28/20 0955)  vancomycin (VANCOREADY) IVPB 1250 mg/250 mL (1,250 mg Intravenous New Bag/Given 06/28/20 1238)  midazolam (VERSED) 2 MG/2ML injection (has no administration in time range)  fentaNYL (SUBLIMAZE) 100 MCG/2ML injection (has no administration in time range)  acetaminophen (TYLENOL) tablet 1,000 mg (1,000 mg Oral Given 06/28/20 1105)  lactated ringers bolus 1,000 mL (1,000 mLs Intravenous New Bag/Given 06/28/20 1111)  ceFEPIme (MAXIPIME) 2 g in sodium chloride 0.9 % 100 mL IVPB (0 g Intravenous Stopped 06/28/20 1222)    ____________________________________________   MDM / ED COURSE   69 year old smoker presents from home with evidence of sepsis due to loculated  right-sided pleural effusion and pneumonia requiring medical admission and IR tube thoracostomy.  Patient febrile with EMS and presents to the ED tachycardic with soft pressures, required nasal cannula for normoxia.  His blood pressure responsive to fluids and he does not require pressors.  Exam demonstrates an uncomfortable atropine patient to his tachypnea and respiratory status, but otherwise has no evidence of acute pathology.  Blood work with leukocytosis, but no lactic acidosis, and evidence of prerenal azotemia AKI.  Patient was provided 2 L of LR to treat this with improvement of his hemodynamics.  Cultures were drawn and patient was provided broad-spectrum antibiotics.  I spoke with IR, who plans to perform diagnostic and therapeutic image guided tube thoracostomy and patient will be admitted to hospitalist medicine for further work-up and management.   Clinical Course as of Jun 28 1318  Mon Jun 28, 2020  3500 X-ray technician pulls me aside in the hallway and shows me the x-ray on the portable monitor.  Large right-sided effusion.     [DS]  [id number].  Fluids ongoing.  Maps improving.  Educated nurse and patient on plan.   [DS]    Clinical Course User  Index [DS] Vladimir Crofts, MD    ____________________________________________   FINAL CLINICAL IMPRESSION(S) / ED DIAGNOSES  Final diagnoses:  Loculated pleural effusion  Hypoxia  Fever, unspecified fever cause  Shortness of breath  AKI (acute kidney injury) [facility])     ED Discharge Orders    None       Haja Crego   Note:  This document was prepared using Dragon voice recognition software and may include unintentional dictation errors.   Vladimir Crofts, MD 06/28/20 1320

## 2020-06-28 NOTE — ED Triage Notes (Signed)
Patient is being discharged from the Urgent Care and sent to the Emergency Department via EMS . Per Christene Slates, NP, patient is in need of higher level of care due to hypoxia. Patient is aware and verbalizes understanding of plan of care.  Vitals:   06/28/20 0840 06/28/20 0843  BP:    Pulse:    Resp:    Temp:    SpO2: 91% 95%

## 2020-06-28 NOTE — Consult Note (Signed)
Chief Complaint: Right sided loculated pleural effusion. Request is for right sided chest tube placement  Referring Physician(s): Dr. Charlsie Quest  Supervising Physician: Jacqulynn Cadet  Patient Status: [facility] - ED  History of Present Illness: Luis Haney is a 69 y.o. male Smoker. No relative medical history. Presented to Urgent Care for cough and fatigue X 2-3 days and right sided back pain 2-3 weeks related to a fall. Transferred to the ED when he was found to be hypoxic with oxygen saturation of  86% on room air and hypotensive.  CT chest from 12.6. reads Large loculated right pleural effusion with compressive atelectasis versus infiltrate/pneumonia in the right lung, particularly right lower lobe and a RUL spiculated nodule. Team is requesting a right sided chest tube placement.   Patient alert and laying in bed, calm and comfortable.Reporting fatigue with cough. Denies any fevers, headache, chest pain, SOB, abdominal pain, nausea, vomiting or bleeding. Return precautions and treatment recommendations and follow-up discussed with the patient who is agreeable with the plan.    No past medical history on file.  Past Surgical History:  Procedure Laterality Date  . BACK SURGERY  2000  . NECK SURGERY  2012    Allergies: Patient has no known allergies.  Medications: Prior to Admission medications   Medication Sig Start Date End Date Taking? Authorizing Provider  calcium carbonate (OS-CAL - DOSED IN MG OF ELEMENTAL CALCIUM) 1250 (500 Ca) MG tablet Take 1 tablet by mouth.    [provider]  cyclobenzaprine (FLEXERIL) 10 MG tablet Take 1 tablet (10 mg total) by mouth 3 (three) times daily as needed for muscle spasms. 07/30/18   Norval Gable, MD  Multiple Vitamin (MULTI VITAMIN MENS PO) Take by mouth.    [provider]  POTASSIUM PO Take 1 tablet by mouth daily.    [provider]     Family History  Problem Relation Age of Onset  . Heart disease  Mother   . Other Father        "old age"    Social History   Socioeconomic History  . Marital status: Married    Spouse name: Not on file  . Number of children: Not on file  . Years of education: Not on file  . Highest education level: Not on file  Occupational History  . Not on file  Tobacco Use  . Smoking status: Current Every Day Smoker    Packs/day: 0.25    Types: Cigarettes  . Smokeless tobacco: Never Used  Vaping Use  . Vaping Use: Never used  Substance and Sexual Activity  . Alcohol use: Yes    Comment: beer occasionally  . Drug use: Not Currently  . Sexual activity: Not on file  Other Topics Concern  . Not on file  Social History Narrative  . Not on file   Social Determinants of Health   Financial Resource Strain:   . Difficulty of Paying Living Expenses: Not on file  Food Insecurity:   . Worried About Charity fundraiser in the Last Year: Not on file  . Ran Out of Food in the Last Year: Not on file  Transportation Needs:   . Lack of Transportation (Medical): Not on file  . Lack of Transportation (Non-Medical): Not on file  Physical Activity:   . Days of Exercise per Week: Not on file  . Minutes of Exercise per Session: Not on file  Stress:   . Feeling of Stress : Not on file  Social Connections:   . Frequency of Communication with Friends and Family: Not on file  . Frequency of Social Gatherings with Friends and Family: Not on file  . Attends Religious Services: Not on file  . Active Member of Clubs or Organizations: Not on file  . Attends Archivist Meetings: Not on file  . Marital Status: Not on file     Review of Systems: A 12 point ROS discussed and pertinent positives are indicated in the HPI above.  All other systems are negative.  Review of Systems  Constitutional: Positive for fatigue. Negative for fever.  HENT: Negative for congestion.   Respiratory: Positive for cough. Negative for shortness of breath.   Cardiovascular:  Negative for chest pain.  Gastrointestinal: Negative for abdominal pain.  Neurological: Negative for headaches.  Psychiatric/Behavioral: Negative for behavioral problems and confusion.    Vital Signs: BP (!) 104/56   Pulse 94   Temp 99.3 F (37.4 C) (Oral)   Resp (!) 32   Ht 6\' 1"  (1.854 m)   Wt 200 lb (90.7 kg)   SpO2 95%   BMI 26.39 kg/m   Physical Exam Vitals and nursing note reviewed.  Constitutional:      Appearance: He is well-developed.  HENT:     Head: Normocephalic.  Cardiovascular:     Rate and Rhythm: Normal rate and regular rhythm.  Pulmonary:     Effort: Tachypnea present.     Breath sounds: Normal breath sounds.  Musculoskeletal:        General: Normal range of motion.     Cervical back: Normal range of motion.  Skin:    General: Skin is dry.  Neurological:     Mental Status: He is alert and oriented to person, place, and time.     Imaging: CT CHEST WO CONTRAST  Result Date: 06/28/2020 CLINICAL DATA:  Abnormal chest x-ray, large effusion EXAM: CT CHEST WITHOUT CONTRAST TECHNIQUE: Multidetector CT imaging of the chest was performed following the standard protocol without IV contrast. COMPARISON:  Chest x-ray earlier today FINDINGS: Cardiovascular: Calcifications in the aorta and coronary arteries. Heart is normal size. Aorta is normal caliber. Mediastinum/Nodes: No mediastinal, hilar, or axillary adenopathy. Trachea and esophagus are unremarkable. Thyroid unremarkable. Lungs/Pleura: Large right pleural effusion loculated laterally. Airspace disease throughout the right lung could reflect compressive atelectasis or pneumonia. This is most confluent in the right lower lobe. Spiculated appearing nodule in the aerated right upper lobe on image 30 measures 10 mm. No confluent opacity or effusion on the left. Upper Abdomen: Imaging into the upper abdomen demonstrates no acute findings. Musculoskeletal: Chest wall soft tissues are unremarkable. No acute bony  abnormality. IMPRESSION: Large loculated right pleural effusion with compressive atelectasis versus infiltrate/pneumonia in the right lung, particularly right lower lobe. Spiculated 10 mm right upper lobe nodule. This warrants further investigation. Consider PET CT after clearance of acute symptoms for further evaluation. Coronary artery disease. Aortic Atherosclerosis (ICD10-I70.0). Electronically Signed   By: Rolm Baptise M.D.   On: 06/28/2020 11:47   DG Chest Port 1 View  Result Date: 06/28/2020 CLINICAL DATA:  Cough, right-sided back pain EXAM: PORTABLE CHEST 1 VIEW COMPARISON:  January 2020 FINDINGS: New large right pleural effusion with associated atelectasis. Left lung is clear. No pneumothorax. Heart size is normal. Mild leftward mediastinal shift. Cervicothoracic fusion. IMPRESSION: New large right pleural effusion with associated atelectasis. Underlying pneumonia is not excluded. Electronically Signed   By: Macy Mis M.D.   On: 06/28/2020 10:09  Labs:  CBC: Recent Labs    06/28/20 0952  WBC 17.5*  HGB 11.5*  HCT 35.0*  PLT 284    COAGS: Recent Labs    06/28/20 0952  INR 1.4*  APTT 41*    BMP: Recent Labs    06/28/20 0952  NA 133*  K 4.7  CL 98  CO2 23  GLUCOSE 131*  BUN 55*  CALCIUM 9.0  CREATININE 3.44*  GFRNONAA 18*    LIVER FUNCTION TESTS: Recent Labs    06/28/20 0952  BILITOT 0.6  AST 17  ALT 11  ALKPHOS 46  PROT 6.4*  ALBUMIN 2.8*    TUMOR MARKERS: No results for input(s): AFPTM, CEA, CA199, CHROMGRNA in the last 8760 hours.  Assessment and Plan:  69 y.o. male inpatient (in ED). Smoker. No relative medical history. Presented to Urgent Care for cough and fatigue X 2-3 days and right sided back pain 2-3 weeks related to a fall. Transferred to the ED when he was found to be hypoxic with oxygen saturation of  86% on room air and hypotensive.  CT chest from 12.6. reads Large loculated right pleural effusion with compressive atelectasis  versus infiltrate/pneumonia in the right lung, particularly right lower lobe and a RUL spiculated nodule. Team is requesting a right sided chest tube placement.   COVID -, WBC is 17.5, BUN 55, Cr 3.44. All other labs and medications are within acceptable parameters. Temperature 99.3.NKDA. Patient has been NPO since midnight.   IR consulted for possible right sided chest tube placement. Case has been reviewed and procedure approved by Dr. Laurence Ferrari.  Patient tentatively scheduled for 12.6.21.  Team instructed to: Keep Patient to be NPO  IR will call patient when ready.  Risks and benefits discussed with the patient including bleeding, infection, damage to adjacent structures, bowel perforation/fistula connection, and sepsis.  All of the patient's questions were answered, patient is agreeable to proceed. Consent signed and given to IR RN.     Thank you for this interesting consult.  I greatly enjoyed meeting Luis Haney and look forward to participating in their care.  A copy of this report was sent to the requesting provider on this date.  Electronically Signed: Jacqualine Mau, NP 06/28/2020, 12:40 PM   I spent a total of 40 Minutes    in face to face in clinical consultation, greater than 50% of which was counseling/coordinating care for right sided chest tube placement.

## 2020-06-28 NOTE — Progress Notes (Signed)
Pharmacy Antibiotic Note  Luis Haney is a 69 y.o. male admitted on 06/28/2020 with pneumonia and sepsis.  Pharmacy has been consulted for Cefepime and Vancomycin dosing.  Plan: Vancomycin 1250mg  IV times 1 given in ED, will continue with Vancomycin 1000mg  IV q24h.   Cefepime 2g IV q24h  Follow renal function  Height: 6\' 1"  (185.4 cm) Weight: 90.7 kg (200 lb) IBW/kg (Calculated) : 79.9  Temp (24hrs), Avg:99.1 F (37.3 C), Min:98.9 F (37.2 C), Max:99.3 F (37.4 C)  Recent Labs  Lab 06/28/20 0952 06/28/20 1229  WBC 17.5*  --   CREATININE 3.44*  --   LATICACIDVEN 1.2 1.5    Estimated Creatinine Clearance: 22.3 mL/min (A) (by C-G formula based on SCr of 3.44 mg/dL (H)).    No Known Allergies  Antimicrobials this admission: Cefepime 12/6 >>  Vancomycin 12/6 >>  Azithromycin 12/6 >>  Dose adjustments this admission:  Microbiology results:  Thank you for allowing pharmacy to be a part of this patient's care.  Paulina Fusi, PharmD, BCPS 06/28/2020 4:22 PM

## 2020-06-28 NOTE — Progress Notes (Signed)
Patient clinically stable post CT placement per DR Laurence Ferrari, tolerated well, 1584ml serous fluid removed, sats improving. Report given to Alf RN in ER post procedure/recovery with questions answered,16FR catheter placed.received Versed 2mg  along with Fentanyl 185mcg IV for procedure. Vitals stable pre and post procedure.

## 2020-06-28 NOTE — ED Provider Notes (Signed)
[facility]    CSN: [id number] Arrival date & time: 06/28/20  0810      History   Chief Complaint Chief Complaint  Patient presents with  . Cough  . Back Pain  . Fatigue    HPI Luis Haney is a 69 y.o. male presenting for 2 to 3-day history of cough that is occasionally productive, fatigue/weakness, nasal congestion/runny nose, and shortness of breath on ambulation.  He denies any shortness of breath at rest.  He denies any fever.  Denies any chest pain or pain on breathing.  No dizziness.  Denies any leg/calf pain or swelling.  He denies any history of COVID-19 or influenza exposure.  He has been vaccinated for COVID-19.  Denies being around any sick contacts.  Patient denies any cardiopulmonary diseases.  No history of DVT or PE.  He is a smoker of about a half a pack per day for many years.  He does have a history of back surgery many years ago.  Patient states that about 2 to 3 weeks ago he fell from a ladder when he was try to grab something.  He states that he has had pain of the right lower back since.  He has tried using a heating pad without relief.  He denies any associated urinary symptoms or blood in the urine.  He denies any other complaints or concerns today.  HPI  History reviewed. No pertinent past medical history.  There are no problems to display for this patient.   Past Surgical History:  Procedure Laterality Date  . BACK SURGERY  2000  . NECK SURGERY  2012       Home Medications    Prior to Admission medications   Medication Sig Start Date End Date Taking? Authorizing Provider  calcium carbonate (OS-CAL - DOSED IN MG OF ELEMENTAL CALCIUM) 1250 (500 Ca) MG tablet Take 1 tablet by mouth.   Yes [provider]  Multiple Vitamin (MULTI VITAMIN MENS PO) Take by mouth.   Yes [provider]  POTASSIUM PO Take 1 tablet by mouth daily.   Yes [provider]  cyclobenzaprine (FLEXERIL) 10 MG tablet Take 1 tablet (10 mg  total) by mouth 3 (three) times daily as needed for muscle spasms. 07/30/18   Norval Gable, MD    Family History Family History  Problem Relation Age of Onset  . Heart disease Mother   . Other Father        "old age"    Social History Social History   Tobacco Use  . Smoking status: Current Every Day Smoker    Packs/day: 0.25    Types: Cigarettes  . Smokeless tobacco: Never Used  Vaping Use  . Vaping Use: Never used  Substance Use Topics  . Alcohol use: Yes    Comment: beer occasionally  . Drug use: Not Currently     Allergies   Patient has no known allergies.   Review of Systems Review of Systems  Constitutional: Positive for diaphoresis and fatigue. Negative for fever.  HENT: Positive for congestion and rhinorrhea. Negative for sore throat.   Respiratory: Positive for cough and shortness of breath. Negative for chest tightness and wheezing.   Cardiovascular: Negative for chest pain, palpitations and leg swelling.  Gastrointestinal: Negative for abdominal pain, diarrhea, nausea and vomiting.  Genitourinary: Positive for flank pain. Negative for dysuria.  Musculoskeletal: Positive for back pain. Negative for myalgias.  Neurological: Positive for weakness. Negative for light-headedness and headaches.  Physical Exam Triage Vital Signs ED Triage Vitals [06/28/20 0830]  Enc Vitals Group     BP (!) 81/52     Pulse Rate (!) 127     Resp (!) 40     Temp 98.9 F (37.2 C)     Temp Source Oral     SpO2 (!) 86 %     Weight 200 lb (90.7 kg)     Height 6\' 1"  (1.854 m)     Head Circumference      Peak Flow      Pain Score 5     Pain Loc      Pain Edu?      Excl. in [place]?    No data found.  Updated Vital Signs BP (!) 88/55 (BP Location: Right Arm)   Pulse (!) 127   Temp 98.9 F (37.2 C) (Oral)   Resp (!) 40   Ht 6\' 1"  (1.854 m)   Wt 200 lb (90.7 kg)   SpO2 95%   BMI 26.39 kg/m       Physical Exam Vitals and nursing note reviewed.  Constitutional:       General: He is not in acute distress.    Appearance: Normal appearance. He is well-developed. He is not ill-appearing, toxic-appearing or diaphoretic.  HENT:     Head: Normocephalic and atraumatic.     Nose: Nose normal. No rhinorrhea.     Mouth/Throat:     Pharynx: Uvula midline. No oropharyngeal exudate.     Tonsils: No tonsillar abscesses.  Eyes:     General: No scleral icterus.       Right eye: No discharge.        Left eye: No discharge.     Conjunctiva/sclera: Conjunctivae normal.  Neck:     Thyroid: No thyromegaly.     Trachea: No tracheal deviation.  Cardiovascular:     Rate and Rhythm: Regular rhythm. Tachycardia present.     Heart sounds: Normal heart sounds.  Pulmonary:     Effort: Respiratory distress (increased RR, difficulty speaking in full sentences) present.     Breath sounds: Wheezing (diffuse rhonchi and wheezing throughout left lung) and rhonchi present. No rales.  Abdominal:     Tenderness: There is no right CVA tenderness or left CVA tenderness.  Musculoskeletal:     Cervical back: Neck supple.     Right lower leg: No edema.     Left lower leg: No edema.     Comments: No calf tenderness  Lymphadenopathy:     Cervical: No cervical adenopathy.  Skin:    General: Skin is warm and dry.     Findings: No rash.  Neurological:     General: No focal deficit present.     Mental Status: He is alert and oriented to person, place, and time. Mental status is at baseline.     Motor: No weakness.     Gait: Gait normal.  Psychiatric:        Mood and Affect: Mood normal.        Behavior: Behavior normal.        Thought Content: Thought content normal.      UC Treatments / Results  Labs (all labs ordered are listed, but only abnormal results are displayed) Labs Reviewed - No data to display  EKG   Radiology No results found.  Procedures Procedures (including critical care time)  Medications Ordered in UC Medications - No data to display  Initial  Impression /  Assessment and Plan / UC Course  I have reviewed the triage vital signs and the nursing notes.  Pertinent labs & imaging results that were available during my care of the patient were reviewed by me and considered in my medical decision making (see chart for details).   69 year old male presenting for 2 to 3-day history of cough and increased shortness of breath.  Upon arrival, oxygen saturation 86% and heart rate in the 120s.  Blood pressure low at 88/55.  Respiratory rate increased to 40.  Patient does appear to be short of breath and has difficulty speaking in full sentences.  He is afebrile.  Patient placed on 10 L nonrebreather oxygen increased to 95%.  We tried 3 to 4 L on nasal cannula but suddenly increased oxygen to 92%.  Diffuse mild/moderate wheezing and rhonchi of the left lung.  EMS called for transport to ED given his vital signs and respiratory distress.  IV (left AC) started and fluids began as well.  Report given to EMS and patient being transported at this time in stable condition.   Final Clinical Impressions(s) / UC Diagnoses   Final diagnoses:  Acute respiratory failure with hypoxia (HCC)  Cough  Fatigue, unspecified type  Acute right-sided low back pain without sciatica     Discharge Instructions     You have been advised to follow up immediately in the emergency department for concerning signs.symptoms. If you declined EMS transport, please have a family member take you directly to the ED at this time. Do not delay. Based on concerns about condition, if you do not follow up in th e ED, you may risk poor outcomes including worsening of condition, delayed treatment and potentially life threatening issues. If you have declined to go to the ED at this time, you should call your PCP immediately to set up a follow up appointment.  Go to ED for red flag symptoms, including; fevers you cannot reduce with Tylenol/Motrin, severe headaches, vision changes,  numbness/weakness in part of the body, lethargy, confusion, intractable vomiting, severe dehydration, chest pain, breathing difficulty, severe persistent abdominal or pelvic pain, signs of severe infection (increased redness, swelling of an area), feeling faint or passing out, dizziness, etc. You should especially go to the ED for sudden acute worsening of condition if you do not elect to go at this time.     ED Prescriptions    None     PDMP not reviewed this encounter.   Danton Clap, PA-C 06/28/20 1346

## 2020-06-28 NOTE — ED Notes (Signed)
1700 noted in pleurovac, site marked for 1930.  Assisted pt up to bedside commode for bowel movement.

## 2020-06-28 NOTE — H&P (Signed)
History and Physical   Luis Haney NKN:[id number] DOB: 11-04-1950 DOA: 06/28/2020  PCP: [facility]: [facility] Patient coming from: home  I have personally briefly reviewed patient's old medical records in [place].  Chief Concern: Back pain  HPI: Luis Haney is a 69 y.o. male with medical history significant for left lower back pain, history of right rotator cuff tendinitis, COPD, erectile dysfunction, presented to the emergency department for chief concerns of back pain.  He reports the back pain is new and started about two weeks ago, 5/10, nagging pain.  He reports he normally has left lower back pain that is chronic.  He endorses weight gained of 3 pounds over the holidays.  He endorses that he smokes 1/2 ppd, and at peak he smoked 1.5 ppd, started at age 28.   He endorsed shortness of breath that started 2 days ago when he was sitting around and is worse with with laying on rigjht side now.  ROS was negative for headache, nausea, vomiting, fever, chills, cough, chest pain, abdominal pain, dysuria, hematuria, swelling in legs, dysphagia, diarrhea, blood in his stools.  Social history: Lives with his wife.  Endorses tobacco use as above.  Endorses infrequent EtOH use.  Denies recreational drug use.  Vaccinations: he has received both [place] vaccinations   ED Course: Discussed with ED provider, requesting admission for new right large pleural effusion with associated atelectasis.  Vital signs in the ED was afebrile, increased respiration rate.  Heart rate stable at bedside.  Blood pressure appropriate map.  ED provider ordered a CT of the chest without contrast that showed large loculated right pleural effusion with compressive atelectasis particularly at the right lower lobe.  Possible infiltrate and pneumonia of the right lung.  A spiculated 10 mm right upper lobe nodule.  ED  provider consulted IR and patient is taken to CT-guided drainage percutaneous of the right lung.  Review of Systems: As per HPI otherwise 10 point review of systems negative.  Assessment/Plan  Principal Problem:   Pleural effusion, right Active Problems:   Mass of upper lobe of right lung   Tobacco use disorder, moderate, dependence   Neutrophilic leukocytosis   Loculated pleural effusion   Lobar pneumonia (HCC)   Right pleural effusion-IR consulted for IR drainage CT-guided -Cultures ordered including anaerobic/aerobic, body fluid culture, glucose of the pleural, LDH of the pleural, protein, surgical pathology  Possible lobar pneumonia on CT Leukocytosis -Status post Vanco and cefepime IV per ED provider -CBC in the a.m.  Severe sepsis with suspected source of Pneumonia with organ dysfunction, renal -Status post 1 L LR bolus and additional 1 L LR bolus in progress -Continue Vanco, cefepime -Added azithromycin for atypical coverage   Acute kidney injury -Serum creatinine on 07/30/2018 was 1.1 -Serum creatinine on ED presentation was 3.44 -Status post 1 L LR bolus  Tobacco use - counseling given  Chart reviewed.   DVT prophylaxis: Heparin subcu Code Status: full code  Diet: Heart healthy diet Family Communication: Updated spouse, Mrs. Theadore Blunck over the phone Disposition Plan: Pending clinical course Consults called: Interventional radiology Admission status: Observation to progressive cardiac  No past medical history on file.  Past Surgical History:  Procedure Laterality Date  . BACK SURGERY  2000  . NECK SURGERY  2012   Social History:  reports that he has been smoking cigarettes. He has been smoking about 0.25 packs per day. He has never  used smokeless tobacco. He reports current alcohol use. He reports previous drug use.  No Known Allergies Family History  Problem Relation Age of Onset  . Heart disease Mother   . Other Father        "old age"    Family history: Family history reviewed and not pertinent  Prior to Admission medications   Medication Sig Start Date End Date Taking? Authorizing Provider  calcium carbonate (OS-CAL - DOSED IN MG OF ELEMENTAL CALCIUM) 1250 (500 Ca) MG tablet Take 1 tablet by mouth.    [provider]  cyclobenzaprine (FLEXERIL) 10 MG tablet Take 1 tablet (10 mg total) by mouth 3 (three) times daily as needed for muscle spasms. 07/30/18   Norval Gable, MD  Multiple Vitamin (MULTI VITAMIN MENS PO) Take by mouth.    [provider]  POTASSIUM PO Take 1 tablet by mouth daily.    [provider]   Physical Exam: Vitals:   06/28/20 1351 06/28/20 1356 06/28/20 1400 06/28/20 1410  BP: (!) 100/56 (!) 97/57 (!) 92/57 (!) 94/55  Pulse: 81 81 80 78  Resp: (!) 27 (!) 33 14 (!) 30  Temp:      TempSrc:      SpO2: 94% 93% 93% 97%  Weight:      Height:       Constitutional: appears age-appropriate, NAD, calm, comfortable Eyes: PERRL, lids and conjunctivae normal ENMT: Mucous membranes are moist. Posterior pharynx clear of any exudate or lesions. Age-appropriate dentition.  Mild hearing loss Neck: normal, supple, no masses, no thyromegaly Respiratory: clear to auscultation bilaterally, no wheezing, no crackles. Normal respiratory effort. No accessory muscle use.  Cardiovascular: Regular rate and rhythm, no murmurs / rubs / gallops. No extremity edema. 2+ pedal pulses. No carotid bruits.  Abdomen: no tenderness, no masses palpated, no hepatosplenomegaly. Bowel sounds positive.  Musculoskeletal: no clubbing / cyanosis. No joint deformity upper and lower extremities. Good ROM, no contractures, no atrophy. Normal muscle tone.  Skin: no rashes, lesions, ulcers. No induration Neurologic: Sensation intact. Strength 5/5 in all 4.  Psychiatric: Normal judgment and insight. Alert and oriented x 3. Normal mood.   EKG: Independently reviewed, showing sinus rhythm, rate of 88, QTc 415  Chest x-ray  on Admission: Personally reviewed and I agree with radiologist reading as below.  CT CHEST WO CONTRAST  Result Date: 06/28/2020 CLINICAL DATA:  Abnormal chest x-ray, large effusion EXAM: CT CHEST WITHOUT CONTRAST TECHNIQUE: Multidetector CT imaging of the chest was performed following the standard protocol without IV contrast. COMPARISON:  Chest x-ray earlier today FINDINGS: Cardiovascular: Calcifications in the aorta and coronary arteries. Heart is normal size. Aorta is normal caliber. Mediastinum/Nodes: No mediastinal, hilar, or axillary adenopathy. Trachea and esophagus are unremarkable. Thyroid unremarkable. Lungs/Pleura: Large right pleural effusion loculated laterally. Airspace disease throughout the right lung could reflect compressive atelectasis or pneumonia. This is most confluent in the right lower lobe. Spiculated appearing nodule in the aerated right upper lobe on image 30 measures 10 mm. No confluent opacity or effusion on the left. Upper Abdomen: Imaging into the upper abdomen demonstrates no acute findings. Musculoskeletal: Chest wall soft tissues are unremarkable. No acute bony abnormality. IMPRESSION: Large loculated right pleural effusion with compressive atelectasis versus infiltrate/pneumonia in the right lung, particularly right lower lobe. Spiculated 10 mm right upper lobe nodule. This warrants further investigation. Consider PET CT after clearance of acute symptoms for further evaluation. Coronary artery disease. Aortic Atherosclerosis (ICD10-I70.0). Electronically Signed   By: Rolm Baptise  M.D.   On: 06/28/2020 11:47   DG Chest Port 1 View  Result Date: 06/28/2020 CLINICAL DATA:  Cough, right-sided back pain EXAM: PORTABLE CHEST 1 VIEW COMPARISON:  January 2020 FINDINGS: New large right pleural effusion with associated atelectasis. Left lung is clear. No pneumothorax. Heart size is normal. Mild leftward mediastinal shift. Cervicothoracic fusion. IMPRESSION: New large right pleural  effusion with associated atelectasis. Underlying pneumonia is not excluded. Electronically Signed   By: Macy Mis M.D.   On: 06/28/2020 10:09   Labs on Admission: I have personally reviewed following labs  CBC: Recent Labs  Lab 06/28/20 0952  WBC 17.5*  NEUTROABS 14.8*  HGB 11.5*  HCT 35.0*  MCV 93.6  PLT 734   Basic Metabolic Panel: Recent Labs  Lab 06/28/20 0952  NA 133*  K 4.7  CL 98  CO2 23  GLUCOSE 131*  BUN 55*  CREATININE 3.44*  CALCIUM 9.0  MG 2.0   GFR: Estimated Creatinine Clearance: 22.3 mL/min (A) (by C-G formula based on SCr of 3.44 mg/dL (H)). Liver Function Tests: Recent Labs  Lab 06/28/20 0952  AST 17  ALT 11  ALKPHOS 46  BILITOT 0.6  PROT 6.4*  ALBUMIN 2.8*   Coagulation Profile: Recent Labs  Lab 06/28/20 0952  INR 1.4*   Ameir Faria N Aarini Slee D.O. [facility]  If 12AM-7AM, please contact overnight-coverage provider If 7AM-7PM, please contact day coverage provider [email or web address]  06/28/2020, 2:22 PM

## 2020-06-28 NOTE — ED Triage Notes (Signed)
Patient in today c/o slight cough, fatigue x 2-3 days. Patient denies fever. Patient denies sob, but his respiration are 40. Patient has had covid vaccines, but concerned he may have the flu.  Patient also c/o right sided back pain x 2-3 weeks. Patient states he fell in his building 2-3 weeks ago.

## 2020-06-28 NOTE — Procedures (Signed)
Interventional Radiology Procedure Note  Procedure: Successful placement of a 1F pigtail chest tube into the right empyema yielding turbid yellow fluid.  Samples sent for culture.   Complications: None  Estimated Blood Loss: None  Recommendations: - Chest tube to LWS at 20 cm H2O - Cultures sent   Signed,  Criselda Peaches, MD

## 2020-06-28 NOTE — ED Triage Notes (Signed)
Pt came from urgent care, SOB, O2 sats 86% RA, hypotensive 88/55.    Also c/o back pain, fever.  Pt on 4L [place].

## 2020-06-28 NOTE — Discharge Instructions (Addendum)

## 2020-06-29 ENCOUNTER — Observation Stay: Payer: Medicare HMO

## 2020-06-29 DIAGNOSIS — J9601 Acute respiratory failure with hypoxia: Secondary | ICD-10-CM | POA: Diagnosis not present

## 2020-06-29 DIAGNOSIS — G4700 Insomnia, unspecified: Secondary | ICD-10-CM | POA: Diagnosis present

## 2020-06-29 DIAGNOSIS — E860 Dehydration: Secondary | ICD-10-CM | POA: Diagnosis present

## 2020-06-29 DIAGNOSIS — R69 Illness, unspecified: Secondary | ICD-10-CM | POA: Diagnosis not present

## 2020-06-29 DIAGNOSIS — J439 Emphysema, unspecified: Secondary | ICD-10-CM | POA: Diagnosis not present

## 2020-06-29 DIAGNOSIS — R918 Other nonspecific abnormal finding of lung field: Secondary | ICD-10-CM | POA: Diagnosis not present

## 2020-06-29 DIAGNOSIS — J9811 Atelectasis: Secondary | ICD-10-CM | POA: Diagnosis not present

## 2020-06-29 DIAGNOSIS — R911 Solitary pulmonary nodule: Secondary | ICD-10-CM | POA: Diagnosis present

## 2020-06-29 DIAGNOSIS — J918 Pleural effusion in other conditions classified elsewhere: Secondary | ICD-10-CM | POA: Diagnosis not present

## 2020-06-29 DIAGNOSIS — F17200 Nicotine dependence, unspecified, uncomplicated: Secondary | ICD-10-CM | POA: Diagnosis not present

## 2020-06-29 DIAGNOSIS — G8929 Other chronic pain: Secondary | ICD-10-CM | POA: Diagnosis present

## 2020-06-29 DIAGNOSIS — Z978 Presence of other specified devices: Secondary | ICD-10-CM | POA: Diagnosis not present

## 2020-06-29 DIAGNOSIS — Z20822 Contact with and (suspected) exposure to covid-19: Secondary | ICD-10-CM | POA: Diagnosis not present

## 2020-06-29 DIAGNOSIS — A419 Sepsis, unspecified organism: Secondary | ICD-10-CM | POA: Diagnosis not present

## 2020-06-29 DIAGNOSIS — J181 Lobar pneumonia, unspecified organism: Secondary | ICD-10-CM | POA: Diagnosis not present

## 2020-06-29 DIAGNOSIS — I700 Atherosclerosis of aorta: Secondary | ICD-10-CM | POA: Diagnosis not present

## 2020-06-29 DIAGNOSIS — J869 Pyothorax without fistula: Secondary | ICD-10-CM | POA: Diagnosis not present

## 2020-06-29 DIAGNOSIS — J948 Other specified pleural conditions: Secondary | ICD-10-CM | POA: Diagnosis not present

## 2020-06-29 DIAGNOSIS — F17210 Nicotine dependence, cigarettes, uncomplicated: Secondary | ICD-10-CM | POA: Diagnosis present

## 2020-06-29 DIAGNOSIS — J440 Chronic obstructive pulmonary disease with acute lower respiratory infection: Secondary | ICD-10-CM | POA: Diagnosis not present

## 2020-06-29 DIAGNOSIS — N179 Acute kidney failure, unspecified: Secondary | ICD-10-CM | POA: Diagnosis not present

## 2020-06-29 DIAGNOSIS — R059 Cough, unspecified: Secondary | ICD-10-CM | POA: Diagnosis not present

## 2020-06-29 DIAGNOSIS — R0602 Shortness of breath: Secondary | ICD-10-CM | POA: Diagnosis not present

## 2020-06-29 DIAGNOSIS — Z7689 Persons encountering health services in other specified circumstances: Secondary | ICD-10-CM | POA: Diagnosis not present

## 2020-06-29 DIAGNOSIS — R6520 Severe sepsis without septic shock: Secondary | ICD-10-CM | POA: Diagnosis present

## 2020-06-29 DIAGNOSIS — J90 Pleural effusion, not elsewhere classified: Secondary | ICD-10-CM | POA: Diagnosis not present

## 2020-06-29 LAB — CBC
HCT: 32.5 % — ABNORMAL LOW (ref 39.0–52.0)
Hemoglobin: 10.7 g/dL — ABNORMAL LOW (ref 13.0–17.0)
MCH: 31.5 pg (ref 26.0–34.0)
MCHC: 32.9 g/dL (ref 30.0–36.0)
MCV: 95.6 fL (ref 80.0–100.0)
Platelets: 268 10*3/uL (ref 150–400)
RBC: 3.4 MIL/uL — ABNORMAL LOW (ref 4.22–5.81)
RDW: 13.7 % (ref 11.5–15.5)
WBC: 11.1 10*3/uL — ABNORMAL HIGH (ref 4.0–10.5)
nRBC: 0 % (ref 0.0–0.2)

## 2020-06-29 LAB — BASIC METABOLIC PANEL
Anion gap: 11 (ref 5–15)
BUN: 56 mg/dL — ABNORMAL HIGH (ref 8–23)
CO2: 22 mmol/L (ref 22–32)
Calcium: 8.4 mg/dL — ABNORMAL LOW (ref 8.9–10.3)
Chloride: 102 mmol/L (ref 98–111)
Creatinine, Ser: 2.34 mg/dL — ABNORMAL HIGH (ref 0.61–1.24)
GFR, Estimated: 29 mL/min — ABNORMAL LOW (ref >60)
Glucose, Bld: 100 mg/dL — ABNORMAL HIGH (ref 70–99)
Potassium: 4.3 mmol/L (ref 3.5–5.1)
Sodium: 135 mmol/L (ref 135–145)

## 2020-06-29 LAB — LACTATE DEHYDROGENASE: LDH: 98 U/L (ref 98–192)

## 2020-06-29 LAB — MRSA PCR SCREENING: MRSA by PCR: NEGATIVE

## 2020-06-29 MED ORDER — SODIUM CHLORIDE 0.9 % IV SOLN
3.0000 g | Freq: Four times a day (QID) | INTRAVENOUS | Status: DC
  Administered 2020-06-29 – 2020-07-09 (×40): 3 g via INTRAVENOUS
  Filled 2020-06-29 (×2): qty 3
  Filled 2020-06-29: qty 0.86
  Filled 2020-06-29 (×5): qty 3
  Filled 2020-06-29: qty 8
  Filled 2020-06-29: qty 3
  Filled 2020-06-29: qty 0.18
  Filled 2020-06-29: qty 3
  Filled 2020-06-29 (×2): qty 8
  Filled 2020-06-29: qty 3
  Filled 2020-06-29: qty 8
  Filled 2020-06-29: qty 3
  Filled 2020-06-29: qty 0.18
  Filled 2020-06-29 (×2): qty 3
  Filled 2020-06-29 (×2): qty 8
  Filled 2020-06-29: qty 3
  Filled 2020-06-29: qty 8
  Filled 2020-06-29: qty 3
  Filled 2020-06-29 (×2): qty 8
  Filled 2020-06-29: qty 3
  Filled 2020-06-29: qty 8
  Filled 2020-06-29 (×2): qty 3
  Filled 2020-06-29: qty 8
  Filled 2020-06-29 (×3): qty 3
  Filled 2020-06-29: qty 8
  Filled 2020-06-29: qty 3
  Filled 2020-06-29: qty 8
  Filled 2020-06-29 (×2): qty 3
  Filled 2020-06-29 (×4): qty 8
  Filled 2020-06-29 (×2): qty 3

## 2020-06-29 MED ORDER — ORAL CARE MOUTH RINSE
15.0000 mL | Freq: Two times a day (BID) | OROMUCOSAL | Status: DC
  Administered 2020-06-29 – 2020-07-09 (×19): 15 mL via OROMUCOSAL

## 2020-06-29 NOTE — Progress Notes (Signed)
PROGRESS NOTE    Luis Haney  TAV:[id number] DOB: 01/03/1951 DOA: 06/28/2020 PCP: [name]   Brief Narrative: Taken from H&P Luis Haney is a 69 y.o. male with medical history significant for left lower back pain, history of right rotator cuff tendinitis, COPD, erectile dysfunction, presented to the emergency department for chief concerns of right-sided upper back pain and shortness of breath.  Patient is a current smoker.  There is an increase in his cough.  No fever or chills. CT chest with right lower lobe infiltrate/pneumonia along with a loculated infusion s/p chest tube placement by IR.  Initial labs with exudative fluid, pending culture. Also found to have a spiculated 10 mm right upper lobe nodule which will need further evaluation as an outpatient once recovered from this acute illness. Blood cultures negative so far.  Subjective: Patient continued to feel short of breath, cough and chest pain at surgical site where the chest tube was inserted.  Denies any fever or chills. Patient was worried about his wife as she had dementia and he was the caregiver, currently with a paid caregiver.  Assessment & Plan:   Principal Problem:   Pleural effusion, right Active Problems:   Mass of upper lobe of right lung   Tobacco use disorder, moderate, dependence   Neutrophilic leukocytosis   Loculated pleural effusion   Lobar pneumonia (HCC)   Empyema lung (HCC)  Severe sepsis with pneumonia and empyema.  Initially met severe sepsis criteria with tachypnea, leukocytosis and chest imaging concerning for pneumonia and empyema along with AKI.  Blood cultures negative so far. Chest tube was placed by IR, draining well, initial labs with exudative fluid which is consistent with his exudate pleural effusion.  He was started on cefepime and vancomycin.  Remained afebrile.  Leukocytosis improving. -Checking MRSA PCR-if negative can discontinue vancomycin. -Switch cefepime with  Unasyn for anaerobic coverage, we can adjust antibiotics once culture results are available. -Continue with supportive care.  AKI.  Unknown baseline, prior creatinine in the system was approximately 50-year old. Creatinine seems improving with IV fluid. -Continue with gentle IV hydration. -Monitor renal function -Avoid nephrotoxins.  COPD.  No acute exacerbation. -Continue with as needed bronchodilators.  Tobacco abuse.  Continue to smoke 1 to 1-1/2 pack/day. -Nicotine patch. -Counseling given.  Spiculated pulmonary nodule.  Will need outpatient evaluation with PET scan as recommended by radiology once acute pneumonia resolves. -Cytology was added to pleural effusion labs.  Objective: Vitals:   06/29/20 0501 06/29/20 0601 06/29/20 0821 06/29/20 1200  BP:   (!) 101/45 114/63  Pulse: 87 79 79 76  Resp: (!) _0 (!) 23  Temp:   (!) 97.5 F (36.4 C) 98.3 F (36.8 C)  TempSrc:   Oral Oral  SpO2: 96% 97% 91% 97%  Weight:      Height:        Intake/Output Summary (Last 24 hours) at 06/29/2020 1237 Last data filed at 06/29/2020 1203 Gross per 24 hour  Intake 2850 ml  Output 5080 ml  Net -2230 ml   Filed Weights   06/28/20 0948 06/28/20 2323 06/29/20 0412  Weight: 90.7 kg 94.2 kg 94.2 kg    Examination:  General exam: Appears calm and comfortable  Respiratory system: Right chest tube in place with drainage of purulent discharge, decreased breath sounds at right base, scattered rhonchi more prominent on right respiratory effort normal. Cardiovascular system: S1 & S2 heard, RRR.  Gastrointestinal system: Soft, nontender, nondistended, bowel sounds positive.  Central nervous system: Alert and oriented. No focal neurological deficits. Extremities: No edema, no cyanosis, pulses intact and symmetrical. Psychiatry: Judgement and insight appear normal. Mood & affect appropriate.    DVT prophylaxis: Heparin Code Status: Full Family Communication: Patient wife is with dementia  and the caregiver is taking care of her, discussed with patient.  No other contact listed. Disposition Plan:  Status is: Inpatient  Remains inpatient appropriate because:Inpatient level of care appropriate due to severity of illness   Dispo: The patient is from: Home              Anticipated d/c is to: Home              Anticipated d/c date is: 2 days              Patient currently is not medically stable to d/c.  Consultants:   Thoracic surgery  IR  Procedures:  Antimicrobials:  Unasyn Zithromax  Data Reviewed: I have personally reviewed following labs and imaging studies  CBC: Recent Labs  Lab 06/28/20 0952 06/29/20 0439  WBC 17.5* 11.1*  NEUTROABS 14.8*  --   HGB 11.5* 10.7*  HCT 35.0* 32.5*  MCV 93.6 95.6  PLT 284 616   Basic Metabolic Panel: Recent Labs  Lab 06/28/20 0952 06/29/20 0439  NA 133* 135  K 4.7 4.3  CL 98 102  CO2 23 22  GLUCOSE 131* 100*  BUN 55* 56*  CREATININE 3.44* 2.34*  CALCIUM 9.0 8.4*  MG 2.0  --    GFR: Estimated Creatinine Clearance: 32.7 mL/min (A) (by C-G formula based on SCr of 2.34 mg/dL (H)). Liver Function Tests: Recent Labs  Lab 06/28/20 0952  AST 17  ALT 11  ALKPHOS 46  BILITOT 0.6  PROT 6.4*  ALBUMIN 2.8*   No results for input(s): LIPASE, AMYLASE in the last 168 hours. No results for input(s): AMMONIA in the last 168 hours. Coagulation Profile: Recent Labs  Lab 06/28/20 0952  INR 1.4*   Cardiac Enzymes: No results for input(s): CKTOTAL, CKMB, CKMBINDEX, TROPONINI in the last 168 hours. BNP (last 3 results) No results for input(s): PROBNP in the last 8760 hours. HbA1C: No results for input(s): HGBA1C in the last 72 hours. CBG: No results for input(s): GLUCAP in the last 168 hours. Lipid Profile: No results for input(s): CHOL, HDL, LDLCALC, TRIG, CHOLHDL, LDLDIRECT in the last 72 hours. Thyroid Function Tests: Recent Labs    06/28/20 0952  TSH 2.960   Anemia Panel: No results for input(s):  VITAMINB12, FOLATE, FERRITIN, TIBC, IRON, RETICCTPCT in the last 72 hours. Sepsis Labs: Recent Labs  Lab 06/28/20 0737 06/28/20 1229  LATICACIDVEN 1.2 1.5    Recent Results (from the past 240 hour(s))  Resp Panel by RT-PCR (Flu A&B, Covid) Nasopharyngeal Swab     Status: None   Collection Time: 06/28/20  9:52 AM   Specimen: Nasopharyngeal Swab; Nasopharyngeal(NP) swabs in vial transport medium  Result Value Ref Range Status   SARS Coronavirus 2 by RT PCR NEGATIVE NEGATIVE Final    Comment: (NOTE) SARS-CoV-2 target nucleic acids are NOT DETECTED.  The SARS-CoV-2 RNA is generally detectable in upper respiratory specimens during the acute phase of infection. The lowest concentration of SARS-CoV-2 viral copies this assay can detect is 138 copies/mL. A negative result does not preclude SARS-Cov-2 infection and should not be used as the sole basis for treatment or other patient management decisions. A negative result may occur with  improper specimen collection/handling, submission of  specimen other than nasopharyngeal swab, presence of viral mutation(s) within the areas targeted by this assay, and inadequate number of viral copies(<138 copies/mL). A negative result must be combined with clinical observations, patient history, and epidemiological information. The expected result is Negative.  Fact Sheet for Patients:  EntrepreneurPulse.com.au  Fact Sheet for Healthcare Providers:  IncredibleEmployment.be  This test is no t yet approved or cleared by the [place] FDA and  has been authorized for detection and/or diagnosis of SARS-CoV-2 by FDA under an Emergency Use Authorization (EUA). This EUA will remain  in effect (meaning this test can be used) for the duration of the COVID-19 declaration under Section 564(b)(1) of the Act, 21 U.S.C.section 360bbb-3(b)(1), unless the authorization is terminated  or revoked sooner.       Influenza A  by PCR NEGATIVE NEGATIVE Final   Influenza B by PCR NEGATIVE NEGATIVE Final    Comment: (NOTE) The Xpert Xpress SARS-CoV-2/FLU/RSV plus assay is intended as an aid in the diagnosis of influenza from Nasopharyngeal swab specimens and should not be used as a sole basis for treatment. Nasal washings and aspirates are unacceptable for Xpert Xpress SARS-CoV-2/FLU/RSV testing.  Fact Sheet for Patients: EntrepreneurPulse.com.au  Fact Sheet for Healthcare Providers: IncredibleEmployment.be  This test is not yet approved or cleared by the [place] FDA and has been authorized for detection and/or diagnosis of SARS-CoV-2 by FDA under an Emergency Use Authorization (EUA). This EUA will remain in effect (meaning this test can be used) for the duration of the COVID-19 declaration under Section 564(b)(1) of the Act, 21 U.S.C. section 360bbb-3(b)(1), unless the authorization is terminated or revoked.  Performed at [facility], [address] [place], [place] [address]   Blood culture (routine x 2)     Status: None (Preliminary result)   Collection Time: 06/28/20  9:52 AM   Specimen: BLOOD RIGHT HAND  Result Value Ref Range Status   Specimen Description BLOOD RIGHT HAND  Final   Special Requests   Final    BOTTLES DRAWN AEROBIC AND ANAEROBIC Blood Culture adequate volume   Culture   Final    NO GROWTH < 24 HOURS Performed at [facility], [address], [place], [place] [address]    Report Status PENDING  Incomplete  Blood culture (routine x 2)     Status: None (Preliminary result)   Collection Time: 06/28/20  9:52 AM   Specimen: BLOOD RIGHT ARM  Result Value Ref Range Status   Specimen Description BLOOD RIGHT ARM  Final   Special Requests   Final    BOTTLES DRAWN AEROBIC AND ANAEROBIC Blood Culture adequate volume   Culture   Final    NO GROWTH < 24 HOURS Performed at [facility], [address],  [place], [place] [address]    Report Status PENDING  Incomplete  Body fluid culture     Status: None (Preliminary result)   Collection Time: 06/28/20  2:17 PM   Specimen: Pleura; Body Fluid  Result Value Ref Range Status   Specimen Description   Final    PLEURAL Performed at [facility], [address] [place], [place] [address]    Special Requests   Final    NONE Performed at [facility], [address] [place],  [address]    Gram Stain PENDING  Incomplete   Culture   Final    NO GROWTH < 24 HOURS Performed at [facility], [place] [address] [place], [place]  [address]    Report Status PENDING  Incomplete  Aerobic/Anaerobic Culture (surgical/deep wound)     Status: None (Preliminary result)   Collection Time: 06/28/20  2:17 PM   Specimen: Abscess  Result Value Ref Range Status   Specimen Description   Final    ABSCESS Performed at [facility], [address] [place], [place] [address]    Special Requests   Final    EMPYEMA Performed at [facility], [address] [place], [place] [address]    Gram Stain PENDING  Incomplete   Culture   Final    NO GROWTH < 24 HOURS Performed at [facility], [place] [address], [place], [place] [address]    Report Status PENDING  Incomplete     Radiology Studies: CT CHEST WO CONTRAST  Result Date: 06/28/2020 CLINICAL DATA:  Abnormal chest x-ray, large effusion EXAM: CT CHEST WITHOUT CONTRAST TECHNIQUE: Multidetector CT imaging of the chest was performed following the standard protocol without IV contrast. COMPARISON:  Chest x-ray earlier today FINDINGS: Cardiovascular: Calcifications in the aorta and coronary arteries. Heart is normal size. Aorta is normal caliber. Mediastinum/Nodes: No mediastinal, hilar, or axillary adenopathy. Trachea and esophagus are unremarkable. Thyroid unremarkable. Lungs/Pleura: Large right pleural effusion loculated laterally. Airspace disease throughout the  right lung could reflect compressive atelectasis or pneumonia. This is most confluent in the right lower lobe. Spiculated appearing nodule in the aerated right upper lobe on image 30 measures 10 mm. No confluent opacity or effusion on the left. Upper Abdomen: Imaging into the upper abdomen demonstrates no acute findings. Musculoskeletal: Chest wall soft tissues are unremarkable. No acute bony abnormality. IMPRESSION: Large loculated right pleural effusion with compressive atelectasis versus infiltrate/pneumonia in the right lung, particularly right lower lobe. Spiculated 10 mm right upper lobe nodule. This warrants further investigation. Consider PET CT after clearance of acute symptoms for further evaluation. Coronary artery disease. Aortic Atherosclerosis (ICD10-I70.0). Electronically Signed   By: Rolm Baptise M.D.   On: 06/28/2020 11:47   DG Chest Port 1 View  Result Date: 06/29/2020 CLINICAL DATA:  Shortness of breath.  Right chest tube. EXAM: PORTABLE CHEST 1 VIEW COMPARISON:  Chest radiograph and chest CT 06/28/2020. Chest radiograph 07/30/2018 FINDINGS: Pigtail drain has been placed in the right chest. Markedly improved aeration in the right lung compatible with removal of pleural fluid. There is a rounded opacity in the right lower chest that measures up to 5.0 cm. Residual densities along the periphery of the right lower chest most likely represents pleural-based densities. Negative for right pneumothorax. Left lung is clear without pneumothorax. Heart size is within normal limits and stable. The trachea is midline. Surgical hardware in the lower cervical spine. IMPRESSION: 1. Markedly improved aeration in the right lung following placement of the right chest tube. Negative for pneumothorax. 2. Large rounded opacity in the right lower chest measuring at least 5.0 cm in diameter. This rounded opacity may represent a focus of loculated pleural fluid but a mass lesion cannot be excluded although there was  no gross abnormality in this area on the study from January 2020. Recommend continued follow-up and patient will likely need follow-up chest CT. Follow-up chest CT with IV contrast would be ideal but probably not feasible due to patient's renal function. Electronically Signed   By: Markus Daft M.D.   On: 06/29/2020 09:33   DG Chest Port 1 View  Result Date: 06/28/2020 CLINICAL DATA:  Right pleural effusion. EXAM: PORTABLE CHEST 1 VIEW COMPARISON:  Same day. FINDINGS: Stable cardiomediastinal silhouette. Interval placement of pigtail drainage catheter into right hemithorax. Right pleural effusion is significantly decreased as a result. Moderate right hydropneumothorax remains. Left lung is clear. Bony thorax is unremarkable. IMPRESSION: Interval placement of pigtail drainage catheter into right hemithorax. Right pleural effusion is significantly decreased as a result. Moderate right hydropneumothorax remains. Electronically Signed   By: Marijo Conception M.D.   On: 06/28/2020 17:59   DG Chest Port 1 View  Result Date: 06/28/2020 CLINICAL DATA:  Cough, right-sided back pain EXAM: PORTABLE CHEST 1 VIEW COMPARISON:  January 2020 FINDINGS: New large right pleural effusion with associated atelectasis. Left lung is clear. No pneumothorax. Heart size is normal. Mild leftward mediastinal shift. Cervicothoracic fusion. IMPRESSION: New large right pleural effusion with associated atelectasis. Underlying pneumonia is not excluded. Electronically Signed   By: Macy Mis M.D.   On: 06/28/2020 10:09   CT IMAGE GUIDED DRAINAGE BY PERCUTANEOUS CATHETER  Result Date: 06/28/2020 INDICATION: 69 year old male with large loculated right-sided pleural effusion and evidence of bacteremia. Findings are concerning for parapneumonic effusion. EXAM: CT-guided chest tube placement MEDICATIONS: The patient is currently admitted to the hospital and receiving intravenous antibiotics. The antibiotics were administered within an  appropriate time frame prior to the initiation of the procedure. ANESTHESIA/SEDATION: Fentanyl 50 mcg IV; Versed 1 mg IV Moderate Sedation Time:  10 minutes The patient was continuously monitored during the procedure by the interventional radiology nurse under my direct supervision. COMPLICATIONS: None immediate. PROCEDURE: Informed written consent was obtained from the patient after a thorough discussion of the procedural risks, benefits and alternatives. All questions were addressed. Maximal Sterile Barrier Technique was utilized including caps, mask, sterile gowns, sterile gloves, sterile drape, hand hygiene and skin antiseptic. A timeout was performed prior to the initiation of the procedure. A planning axial CT scan was performed. The large loculated right-sided pleural effusion was identified. A suitable skin entry site and an anterior intercostal space was identified. The skin was marked. After sterile prep and drape using chlorhexidine skin prep, local anesthesia was attained by infiltration with 1% lidocaine. A small dermatotomy was made. Using trocar technique, a Cook 90 [place] all-purpose drainage catheter was advanced over the rib and into the pleural space. The catheter was connected to low wall suction via a Serra pleura vac device. Nearly 2 L of pleural fluid was successfully aspirated in the first few minutes. Follow-up CT imaging demonstrates a well-positioned chest tube. No immediate complication. Tube was secured to the skin with 0 Prolene suture. An air tight sterile dressing was applied. IMPRESSION: Successful placement of a 16 French chest tube into the loculated right pleural effusion. Evacuation yields cloudy yellow fluid. Samples were sent for Gram stain and culture. Electronically Signed   By: Jacqulynn Cadet M.D.   On: 06/28/2020 15:14    Scheduled Meds: . heparin  5,000 Units Subcutaneous Q8H  . mouth rinse  15 mL Mouth Rinse BID  . multivitamin with minerals  1 tablet Oral Daily    Continuous Infusions: . ampicillin-sulbactam (UNASYN) IV    . azithromycin Stopped (06/28/20 1732)  . vancomycin 1,000 mg (06/29/20 0835)     LOS: 0 days   Time spent: 45 minutes.  Lorella Nimrod, MD [facility]  If 7PM-7AM, please contact night-coverage [email or web address]  06/29/2020, 12:37 PM   This record has been created using Systems analyst. Errors have been sought and corrected,but may not always be located. Such creation errors do not reflect on the  standard of care.

## 2020-06-29 NOTE — Plan of Care (Signed)
  Problem: Clinical Measurements: Goal: Cardiovascular complication will be avoided Outcome: Progressing   Problem: Activity: Goal: Risk for activity intolerance will decrease Outcome: Progressing   Problem: Clinical Measurements: Goal: Respiratory complications will improve Outcome: Progressing   Problem: Clinical Measurements: Goal: Diagnostic test results will improve Outcome: Progressing

## 2020-06-29 NOTE — Plan of Care (Signed)
  Problem: Clinical Measurements: Goal: Respiratory complications will improve Outcome: Progressing Reports less shortness of breath since chest tube insertion.  Chest tube with moderate amounts yellow drainage.

## 2020-06-29 NOTE — Progress Notes (Signed)
Referring Physician(s): Smith,D  Supervising Physician: Jacqulynn Cadet  Patient Status:  [facility] - In-pt  Chief Complaint:  Cough, dyspnea, chest discomfort, pleural effusion  Subjective: Pt with occ cough/dyspnea , soreness at rt chest drain site as expected   Past Surgical History:  Procedure Laterality Date  . BACK SURGERY  2000  . NECK SURGERY  2012    Allergies: Patient has no known allergies.  Medications: Prior to Admission medications   Medication Sig Start Date End Date Taking? Authorizing Provider  calcium carbonate (OS-CAL - DOSED IN MG OF ELEMENTAL CALCIUM) 1250 (500 Ca) MG tablet Take 1 tablet by mouth.   Yes [provider]  Multiple Vitamin (MULTI VITAMIN MENS PO) Take by mouth.   Yes [provider]  Potassium 99 MG TABS Take 99 mg by mouth daily.   Yes [provider]     Vital Signs: BP (!) 101/45 (BP Location: Right Arm)   Pulse 79   Temp (!) 97.5 F (36.4 C) (Oral)   Resp 18   Ht 6\' 1"  (1.854 m)   Wt 207 lb 9.6 oz (94.2 kg)   SpO2 91%   BMI 27.39 kg/m   Physical Exam awake/alert; rt chest drain intact, dressing dry, site mildly tender, tube to pleuravac/ wall suction, no visible air leak, OP since placement about 2.3 liters turbid yellow fluid  Imaging: CT CHEST WO CONTRAST  Result Date: 06/28/2020 CLINICAL DATA:  Abnormal chest x-ray, large effusion EXAM: CT CHEST WITHOUT CONTRAST TECHNIQUE: Multidetector CT imaging of the chest was performed following the standard protocol without IV contrast. COMPARISON:  Chest x-ray earlier today FINDINGS: Cardiovascular: Calcifications in the aorta and coronary arteries. Heart is normal size. Aorta is normal caliber. Mediastinum/Nodes: No mediastinal, hilar, or axillary adenopathy. Trachea and esophagus are unremarkable. Thyroid unremarkable. Lungs/Pleura: Large right pleural effusion loculated laterally. Airspace disease throughout the right lung could reflect compressive  atelectasis or pneumonia. This is most confluent in the right lower lobe. Spiculated appearing nodule in the aerated right upper lobe on image 30 measures 10 mm. No confluent opacity or effusion on the left. Upper Abdomen: Imaging into the upper abdomen demonstrates no acute findings. Musculoskeletal: Chest wall soft tissues are unremarkable. No acute bony abnormality. IMPRESSION: Large loculated right pleural effusion with compressive atelectasis versus infiltrate/pneumonia in the right lung, particularly right lower lobe. Spiculated 10 mm right upper lobe nodule. This warrants further investigation. Consider PET CT after clearance of acute symptoms for further evaluation. Coronary artery disease. Aortic Atherosclerosis (ICD10-I70.0). Electronically Signed   By: Rolm Baptise M.D.   On: 06/28/2020 11:47   DG Chest Port 1 View  Result Date: 06/29/2020 CLINICAL DATA:  Shortness of breath.  Right chest tube. EXAM: PORTABLE CHEST 1 VIEW COMPARISON:  Chest radiograph and chest CT 06/28/2020. Chest radiograph 07/30/2018 FINDINGS: Pigtail drain has been placed in the right chest. Markedly improved aeration in the right lung compatible with removal of pleural fluid. There is a rounded opacity in the right lower chest that measures up to 5.0 cm. Residual densities along the periphery of the right lower chest most likely represents pleural-based densities. Negative for right pneumothorax. Left lung is clear without pneumothorax. Heart size is within normal limits and stable. The trachea is midline. Surgical hardware in the lower cervical spine. IMPRESSION: 1. Markedly improved aeration in the right lung following placement of the right chest tube. Negative for pneumothorax. 2. Large rounded opacity in the right lower chest measuring at least 5.0 cm  in diameter. This rounded opacity may represent a focus of loculated pleural fluid but a mass lesion cannot be excluded although there was no gross abnormality in this area on  the study from January 2020. Recommend continued follow-up and patient will likely need follow-up chest CT. Follow-up chest CT with IV contrast would be ideal but probably not feasible due to patient's renal function. Electronically Signed   By: Markus Daft M.D.   On: 06/29/2020 09:33   DG Chest Port 1 View  Result Date: 06/28/2020 CLINICAL DATA:  Right pleural effusion. EXAM: PORTABLE CHEST 1 VIEW COMPARISON:  Same day. FINDINGS: Stable cardiomediastinal silhouette. Interval placement of pigtail drainage catheter into right hemithorax. Right pleural effusion is significantly decreased as a result. Moderate right hydropneumothorax remains. Left lung is clear. Bony thorax is unremarkable. IMPRESSION: Interval placement of pigtail drainage catheter into right hemithorax. Right pleural effusion is significantly decreased as a result. Moderate right hydropneumothorax remains. Electronically Signed   By: Marijo Conception M.D.   On: 06/28/2020 17:59   DG Chest Port 1 View  Result Date: 06/28/2020 CLINICAL DATA:  Cough, right-sided back pain EXAM: PORTABLE CHEST 1 VIEW COMPARISON:  January 2020 FINDINGS: New large right pleural effusion with associated atelectasis. Left lung is clear. No pneumothorax. Heart size is normal. Mild leftward mediastinal shift. Cervicothoracic fusion. IMPRESSION: New large right pleural effusion with associated atelectasis. Underlying pneumonia is not excluded. Electronically Signed   By: Macy Mis M.D.   On: 06/28/2020 10:09   CT IMAGE GUIDED DRAINAGE BY PERCUTANEOUS CATHETER  Result Date: 06/28/2020 INDICATION: 69 year old male with large loculated right-sided pleural effusion and evidence of bacteremia. Findings are concerning for parapneumonic effusion. EXAM: CT-guided chest tube placement MEDICATIONS: The patient is currently admitted to the hospital and receiving intravenous antibiotics. The antibiotics were administered within an appropriate time frame prior to the  initiation of the procedure. ANESTHESIA/SEDATION: Fentanyl 50 mcg IV; Versed 1 mg IV Moderate Sedation Time:  10 minutes The patient was continuously monitored during the procedure by the interventional radiology nurse under my direct supervision. COMPLICATIONS: None immediate. PROCEDURE: Informed written consent was obtained from the patient after a thorough discussion of the procedural risks, benefits and alternatives. All questions were addressed. Maximal Sterile Barrier Technique was utilized including caps, mask, sterile gowns, sterile gloves, sterile drape, hand hygiene and skin antiseptic. A timeout was performed prior to the initiation of the procedure. A planning axial CT scan was performed. The large loculated right-sided pleural effusion was identified. A suitable skin entry site and an anterior intercostal space was identified. The skin was marked. After sterile prep and drape using chlorhexidine skin prep, local anesthesia was attained by infiltration with 1% lidocaine. A small dermatotomy was made. Using trocar technique, a Cook 25 [place] all-purpose drainage catheter was advanced over the rib and into the pleural space. The catheter was connected to low wall suction via a Serra pleura vac device. Nearly 2 L of pleural fluid was successfully aspirated in the first few minutes. Follow-up CT imaging demonstrates a well-positioned chest tube. No immediate complication. Tube was secured to the skin with 0 Prolene suture. An air tight sterile dressing was applied. IMPRESSION: Successful placement of a 16 French chest tube into the loculated right pleural effusion. Evacuation yields cloudy yellow fluid. Samples were sent for Gram stain and culture. Electronically Signed   By: Jacqulynn Cadet M.D.   On: 06/28/2020 15:14    Labs:  CBC: Recent Labs    06/28/20 6948 06/29/20  0439  WBC 17.5* 11.1*  HGB 11.5* 10.7*  HCT 35.0* 32.5*  PLT 284 268    COAGS: Recent Labs    06/28/20 0952  INR 1.4*   APTT 41*    BMP: Recent Labs    06/28/20 0952 06/29/20 0439  NA 133* 135  K 4.7 4.3  CL 98 102  CO2 23 22  GLUCOSE 131* 100*  BUN 55* 56*  CALCIUM 9.0 8.4*  CREATININE 3.44* 2.34*  GFRNONAA 18* 29*    LIVER FUNCTION TESTS: Recent Labs    06/28/20 0952  BILITOT 0.6  AST 17  ALT 11  ALKPHOS 46  PROT 6.4*  ALBUMIN 2.8*    Assessment and Plan: Pt with hx smoking, COPD, AKI, large loculated right pleural effusion, rt upper lobe lung nodule; s/p rt chest drain placement 12/6; afebrile; WBC 11.1(17.5), hgb 10.7(11.5), creat 2.34(3.44), blood/urine/pleural fl cx pend; send pleural fluid for cytology if not done already; CXR today:  1. Markedly improved aeration in the right lung following placement of the right chest tube. Negative for pneumothorax. 2. Large rounded opacity in the right lower chest measuring at least 5.0 cm in diameter. This rounded opacity may represent a focus of loculated pleural fluid but a mass lesion cannot be excluded although there was no gross abnormality in this area on the study from January 2020. Recommend continued follow-up and patient will likely need follow-up chest CT. Follow-up chest CT with IV contrast would be ideal but probably not feasible due to patient's renal Function.  Cont current tx, hydrate, monitor CXR for now/drain OP ; once OP minimal , consider f/u CT chest with IV contrast (IF CREAT NORMALIZED)   Electronically Signed: D. Rowe Robert, PA-C 06/29/2020, 10:01 AM   I spent a total of 15 minutes at the the patient's bedside AND on the patient's hospital floor or unit, greater than 50% of which was counseling/coordinating care for right chest drain placement    Patient ID: Luis Haney, male   DOB: [birth year], 69 y.o.   MRN: [id number]

## 2020-06-30 LAB — BASIC METABOLIC PANEL
Anion gap: 9 (ref 5–15)
BUN: 43 mg/dL — ABNORMAL HIGH (ref 8–23)
CO2: 25 mmol/L (ref 22–32)
Calcium: 8.2 mg/dL — ABNORMAL LOW (ref 8.9–10.3)
Chloride: 103 mmol/L (ref 98–111)
Creatinine, Ser: 1.35 mg/dL — ABNORMAL HIGH (ref 0.61–1.24)
GFR, Estimated: 56 mL/min — ABNORMAL LOW (ref >60)
Glucose, Bld: 118 mg/dL — ABNORMAL HIGH (ref 70–99)
Potassium: 4 mmol/L (ref 3.5–5.1)
Sodium: 137 mmol/L (ref 135–145)

## 2020-06-30 LAB — CBC
HCT: 34.6 % — ABNORMAL LOW (ref 39.0–52.0)
Hemoglobin: 11.6 g/dL — ABNORMAL LOW (ref 13.0–17.0)
MCH: 31.5 pg (ref 26.0–34.0)
MCHC: 33.5 g/dL (ref 30.0–36.0)
MCV: 94 fL (ref 80.0–100.0)
Platelets: 293 10*3/uL (ref 150–400)
RBC: 3.68 MIL/uL — ABNORMAL LOW (ref 4.22–5.81)
RDW: 13.6 % (ref 11.5–15.5)
WBC: 12.4 10*3/uL — ABNORMAL HIGH (ref 4.0–10.5)
nRBC: 0 % (ref 0.0–0.2)

## 2020-06-30 LAB — URINE CULTURE: Culture: NO GROWTH

## 2020-06-30 MED ORDER — NICOTINE 14 MG/24HR TD PT24
14.0000 mg | MEDICATED_PATCH | Freq: Every day | TRANSDERMAL | Status: DC
  Administered 2020-06-30 – 2020-07-09 (×10): 14 mg via TRANSDERMAL
  Filled 2020-06-30 (×10): qty 1

## 2020-06-30 NOTE — Progress Notes (Signed)
PROGRESS NOTE    Luis Haney  MRN:[id number] DOB: 07/15/1951 DOA: 06/28/2020 PCP: [name]   Brief Narrative: Taken from H&P Luis Haney is a 69 y.o. male with medical history significant for left lower back pain, history of right rotator cuff tendinitis, COPD, erectile dysfunction, presented to the emergency department for chief concerns of right-sided upper back pain and shortness of breath.  Patient is a current smoker.  There is an increase in his cough.  No fever or chills. CT chest with right lower lobe infiltrate/pneumonia along with a loculated infusion s/p chest tube placement by IR.  Initial labs with exudative fluid, pending culture. Also found to have a spiculated 10 mm right upper lobe nodule which will need further evaluation as an outpatient once recovered from this acute illness. Blood cultures negative so far.  Subjective: Patient continued to feel short of breath, cough and chest pain at surgical site where the chest tube was inserted.  Denies any fever or chills. Patient was worried about his wife as she had dementia and he was the caregiver, currently with a paid caregiver.  Assessment & Plan:   Principal Problem:   Pleural effusion, right Active Problems:   Mass of upper lobe of right lung   Tobacco use disorder, moderate, dependence   Neutrophilic leukocytosis   Loculated pleural effusion   Lobar pneumonia (HCC)   Empyema lung (HCC)  Severe sepsis with pneumonia and empyema.  Initially met severe sepsis criteria with tachypnea, leukocytosis and chest imaging concerning for pneumonia and empyema along with AKI.  Blood cultures negative so far. Chest tube was placed by IR, draining well (out 570 yesterday), initial labs with exudative fluid which is consistent with his exudate pleural effusion.  He was started on cefepime and vancomycin.  Remained afebrile.  Leukocytosis improving. mrsa screen negative. Symptomatically improving. - s/p  vanc/cefepime, started on unasyn 12/7 - continue to train effusion, when output decreases will repeat CT - pulmonology has been consulted  Acute hypoxic respiratory failure  Secondary to above process. Now on 4 L - wean as able  AKI.  Unknown baseline, prior creatinine in the system was approximately 2-year old. Creatinine improving with IV fluid, down to 1.35 today - po ad lib, now off IV fluids -Monitor renal function -Avoid nephrotoxins.  COPD.  No acute exacerbation. -Continue with as needed bronchodilators.  Tobacco abuse.  Continue to smoke 1 to 1-1/2 pack/day. -Nicotine patch. -Counseling given.  Spiculated pulmonary nodule.  Will need outpatient evaluation with PET scan as recommended by radiology once acute pneumonia resolves. -Cytology was added to pleural effusion labs. - pulm consult as above  Objective: Vitals:   06/29/20 2314 06/30/20 0252 06/30/20 0752 06/30/20 0800  BP: (!) 109/58 (!) 113/57  120/67  Pulse: 71 89  80  Resp: 17 (!) 21  18  Temp: 98.3 F (36.8 C) 98.2 F (36.8 C) 98.4 F (36.9 C)   TempSrc: Oral Oral Oral   SpO2: 100% 95%  91%  Weight:  98.2 kg    Height:        Intake/Output Summary (Last 24 hours) at 06/30/2020 1040 Last data filed at 06/30/2020 0543 Gross per 24 hour  Intake 600 ml  Output 1320 ml  Net -720 ml   Filed Weights   06/28/20 2323 06/29/20 0412 06/30/20 0252  Weight: 94.2 kg 94.2 kg 98.2 kg    Examination:  General exam: Appears calm and comfortable  Respiratory system: Right chest tube in place   with drainage of purulent discharge, decreased breath sounds at right base, scattered rhonchi more prominent on right respiratory effort normal. Cardiovascular system: S1 & S2 heard, RRR.  Gastrointestinal system: Soft, nontender, nondistended, bowel sounds positive. Central nervous system: Alert and oriented. No focal neurological deficits. Extremities: No edema, no cyanosis, pulses intact and symmetrical. Psychiatry:  Judgement and insight appear normal. Mood & affect appropriate.    DVT prophylaxis: Heparin Code Status: Full Family Communication: Patient wife is with dementia and the caregiver is taking care of her, discussed with patient.  No other contact listed. Disposition Plan:  Status is: Inpatient  Remains inpatient appropriate because:Inpatient level of care appropriate due to severity of illness   Dispo: The patient is from: Home              Anticipated d/c is to: Home              Anticipated d/c date is: 2 days              Patient currently is not medically stable to d/c.  Consultants:   pulmonology  IR  Procedures:  Antimicrobials:  Vancomycin 12/6-12/7 Cefepime 12/6-12/7 Zithromax 12/6-12/7 Unasyn 12/7 -  Data Reviewed: I have personally reviewed following labs and imaging studies  CBC: Recent Labs  Lab 06/28/20 0952 06/29/20 0439 06/30/20 0614  WBC 17.5* 11.1* 12.4*  NEUTROABS 14.8*  --   --   HGB 11.5* 10.7* 11.6*  HCT 35.0* 32.5* 34.6*  MCV 93.6 95.6 94.0  PLT 284 268 293   Basic Metabolic Panel: Recent Labs  Lab 06/28/20 0952 06/29/20 0439 06/30/20 0614  NA 133* 135 137  K 4.7 4.3 4.0  CL 98 102 103  CO2 23 22 25  GLUCOSE 131* 100* 118*  BUN 55* 56* 43*  CREATININE 3.44* 2.34* 1.35*  CALCIUM 9.0 8.4* 8.2*  MG 2.0  --   --    GFR: Estimated Creatinine Clearance: 61.9 mL/min (A) (by C-G formula based on SCr of 1.35 mg/dL (H)). Liver Function Tests: Recent Labs  Lab 06/28/20 0952  AST 17  ALT 11  ALKPHOS 46  BILITOT 0.6  PROT 6.4*  ALBUMIN 2.8*   No results for input(s): LIPASE, AMYLASE in the last 168 hours. No results for input(s): AMMONIA in the last 168 hours. Coagulation Profile: Recent Labs  Lab 06/28/20 0952  INR 1.4*   Cardiac Enzymes: No results for input(s): CKTOTAL, CKMB, CKMBINDEX, TROPONINI in the last 168 hours. BNP (last 3 results) No results for input(s): PROBNP in the last 8760 hours. HbA1C: No results for  input(s): HGBA1C in the last 72 hours. CBG: No results for input(s): GLUCAP in the last 168 hours. Lipid Profile: No results for input(s): CHOL, HDL, LDLCALC, TRIG, CHOLHDL, LDLDIRECT in the last 72 hours. Thyroid Function Tests: Recent Labs    06/28/20 0952  TSH 2.960   Anemia Panel: No results for input(s): VITAMINB12, FOLATE, FERRITIN, TIBC, IRON, RETICCTPCT in the last 72 hours. Sepsis Labs: Recent Labs  Lab 06/28/20 0952 06/28/20 1229  LATICACIDVEN 1.2 1.5    Recent Results (from the past 240 hour(s))  Urine culture     Status: None   Collection Time: 06/28/20  9:36 AM   Specimen: In/Out Cath Urine  Result Value Ref Range Status   Specimen Description   Final    IN/OUT CATH URINE Performed at [facility], [address] [place], [place] [address]    Special Requests   Final    NONE   Performed at [facility], [address] [place], [place] [address]    Culture   Final    NO GROWTH Performed at [facility], [place] [address] [place], [place] [address]    Report Status 06/30/2020 FINAL  Final  Resp Panel by RT-PCR (Flu A&B, Covid) Nasopharyngeal Swab     Status: None   Collection Time: 06/28/20  9:52 AM   Specimen: Nasopharyngeal Swab; Nasopharyngeal(NP) swabs in vial transport medium  Result Value Ref Range Status   SARS Coronavirus 2 by RT PCR NEGATIVE NEGATIVE Final    Comment: (NOTE) SARS-CoV-2 target nucleic acids are NOT DETECTED.  The SARS-CoV-2 RNA is generally detectable in upper respiratory specimens during the acute phase of infection. The lowest concentration of SARS-CoV-2 viral copies this assay can detect is 138 copies/mL. A negative result does not preclude SARS-Cov-2 infection and should not be used as the sole basis for treatment or other patient management decisions. A negative result may occur with  improper specimen collection/handling, submission of specimen other than nasopharyngeal swab, presence of viral  mutation(s) within the areas targeted by this assay, and inadequate number of viral copies(<138 copies/mL). A negative result must be combined with clinical observations, patient history, and epidemiological information. The expected result is Negative.  Fact Sheet for Patients:  EntrepreneurPulse.com.au  Fact Sheet for Healthcare Providers:  IncredibleEmployment.be  This test is no t yet approved or cleared by the [place] FDA and  has been authorized for detection and/or diagnosis of SARS-CoV-2 by FDA under an Emergency Use Authorization (EUA). This EUA will remain  in effect (meaning this test can be used) for the duration of the COVID-19 declaration under Section 564(b)(1) of the Act, 21 U.S.C.section 360bbb-3(b)(1), unless the authorization is terminated  or revoked sooner.       Influenza A by PCR NEGATIVE NEGATIVE Final   Influenza B by PCR NEGATIVE NEGATIVE Final    Comment: (NOTE) The Xpert Xpress SARS-CoV-2/FLU/RSV plus assay is intended as an aid in the diagnosis of influenza from Nasopharyngeal swab specimens and should not be used as a sole basis for treatment. Nasal washings and aspirates are unacceptable for Xpert Xpress SARS-CoV-2/FLU/RSV testing.  Fact Sheet for Patients: EntrepreneurPulse.com.au  Fact Sheet for Healthcare Providers: IncredibleEmployment.be  This test is not yet approved or cleared by the [place] FDA and has been authorized for detection and/or diagnosis of SARS-CoV-2 by FDA under an Emergency Use Authorization (EUA). This EUA will remain in effect (meaning this test can be used) for the duration of the COVID-19 declaration under Section 564(b)(1) of the Act, 21 U.S.C. section 360bbb-3(b)(1), unless the authorization is terminated or revoked.  Performed at [facility], [address] [place], [place] [address]   Blood culture (routine x 2)      Status: None (Preliminary result)   Collection Time: 06/28/20  9:52 AM   Specimen: BLOOD RIGHT HAND  Result Value Ref Range Status   Specimen Description BLOOD RIGHT HAND  Final   Special Requests   Final    BOTTLES DRAWN AEROBIC AND ANAEROBIC Blood Culture adequate volume   Culture   Final    NO GROWTH 2 DAYS Performed at [facility], [address] [place], [place] [address]    Report Status PENDING  Incomplete  Blood culture (routine x 2)     Status: None (Preliminary result)   Collection Time: 06/28/20  9:52 AM   Specimen: BLOOD RIGHT ARM  Result Value Ref Range  Status   Specimen Description BLOOD RIGHT ARM  Final   Special Requests   Final    BOTTLES DRAWN AEROBIC AND ANAEROBIC Blood Culture adequate volume   Culture   Final    NO GROWTH 2 DAYS Performed at [facility], [address] [place], [place] [address]    Report Status PENDING  Incomplete  Body fluid culture     Status: None (Preliminary result)   Collection Time: 06/28/20  2:17 PM   Specimen: Pleura; Body Fluid  Result Value Ref Range Status   Specimen Description   Final    PLEURAL Performed at [facility], [address] [place], [place] [address]    Special Requests   Final    NONE Performed at [facility], [address] [place], [place] [address]    Gram Stain NO WBC SEEN NO ORGANISMS SEEN   Final   Culture   Final    NO GROWTH 2 DAYS Performed at [facility], [address] [place], [place] [address]    Report Status PENDING  Incomplete  Aerobic/Anaerobic Culture (surgical/deep wound)     Status: None (Preliminary result)   Collection Time: 06/28/20  2:17 PM   Specimen: Abscess  Result Value Ref Range Status   Specimen Description   Final    ABSCESS Performed at [facility], [address] [place], [place] [address]    Special Requests   Final    EMPYEMA Performed at [facility], [address] [place],  [place] [address]    Gram Stain   Final    ABUNDANT WBC PRESENT,BOTH PMN AND MONONUCLEAR NO ORGANISMS SEEN    Culture   Final    NO GROWTH < 24 HOURS Performed at [facility], [address] [place], [place] [address]    Report Status PENDING  Incomplete  MRSA PCR Screening     Status: None   Collection Time: 06/29/20 11:55 AM   Specimen: Nasal Mucosa; Nasopharyngeal  Result Value Ref Range Status   MRSA by PCR NEGATIVE NEGATIVE Final    Comment:        The GeneXpert MRSA Assay (FDA approved for NASAL specimens only), is one component of a comprehensive MRSA colonization surveillance program. It is not intended to diagnose MRSA infection nor to guide or monitor treatment for MRSA infections. Performed at [facility], [address] [place], [place] [address]      Radiology Studies: CT CHEST WO CONTRAST  Result Date: 06/28/2020 CLINICAL DATA:  Abnormal chest x-ray, large effusion EXAM: CT CHEST WITHOUT CONTRAST TECHNIQUE: Multidetector CT imaging of the chest was performed following the standard protocol without IV contrast. COMPARISON:  Chest x-ray earlier today FINDINGS: Cardiovascular: Calcifications in the aorta and coronary arteries. Heart is normal size. Aorta is normal caliber. Mediastinum/Nodes: No mediastinal, hilar, or axillary adenopathy. Trachea and esophagus are unremarkable. Thyroid unremarkable. Lungs/Pleura: Large right pleural effusion loculated laterally. Airspace disease throughout the right lung could reflect compressive atelectasis or pneumonia. This is most confluent in the right lower lobe. Spiculated appearing nodule in the aerated right upper lobe on image 30 measures 10 mm. No confluent opacity or effusion on the left. Upper Abdomen: Imaging into the upper abdomen demonstrates no acute findings. Musculoskeletal: Chest wall soft tissues are unremarkable. No acute bony abnormality. IMPRESSION: Large loculated right pleural effusion with compressive  atelectasis versus infiltrate/pneumonia in the right lung, particularly right lower lobe. Spiculated 10 mm right upper   lobe nodule. This warrants further investigation. Consider PET CT after clearance of acute symptoms for further evaluation. Coronary artery disease. Aortic Atherosclerosis (ICD10-I70.0). Electronically Signed   By: Kevin  Dover M.D.   On: 06/28/2020 11:47   DG Chest Port 1 View  Result Date: 06/29/2020 CLINICAL DATA:  Shortness of breath.  Right chest tube. EXAM: PORTABLE CHEST 1 VIEW COMPARISON:  Chest radiograph and chest CT 06/28/2020. Chest radiograph 07/30/2018 FINDINGS: Pigtail drain has been placed in the right chest. Markedly improved aeration in the right lung compatible with removal of pleural fluid. There is a rounded opacity in the right lower chest that measures up to 5.0 cm. Residual densities along the periphery of the right lower chest most likely represents pleural-based densities. Negative for right pneumothorax. Left lung is clear without pneumothorax. Heart size is within normal limits and stable. The trachea is midline. Surgical hardware in the lower cervical spine. IMPRESSION: 1. Markedly improved aeration in the right lung following placement of the right chest tube. Negative for pneumothorax. 2. Large rounded opacity in the right lower chest measuring at least 5.0 cm in diameter. This rounded opacity may represent a focus of loculated pleural fluid but a mass lesion cannot be excluded although there was no gross abnormality in this area on the study from January 2020. Recommend continued follow-up and patient will likely need follow-up chest CT. Follow-up chest CT with IV contrast would be ideal but probably not feasible due to patient's renal function. Electronically Signed   By: Adam  Henn M.D.   On: 06/29/2020 09:33   DG Chest Port 1 View  Result Date: 06/28/2020 CLINICAL DATA:  Right pleural effusion. EXAM: PORTABLE CHEST 1 VIEW COMPARISON:  Same day. FINDINGS:  Stable cardiomediastinal silhouette. Interval placement of pigtail drainage catheter into right hemithorax. Right pleural effusion is significantly decreased as a result. Moderate right hydropneumothorax remains. Left lung is clear. Bony thorax is unremarkable. IMPRESSION: Interval placement of pigtail drainage catheter into right hemithorax. Right pleural effusion is significantly decreased as a result. Moderate right hydropneumothorax remains. Electronically Signed   By: James  Green Jr M.D.   On: 06/28/2020 17:59   CT IMAGE GUIDED DRAINAGE BY PERCUTANEOUS CATHETER  Result Date: 06/28/2020 INDICATION: 69-year-old male with large loculated right-sided pleural effusion and evidence of bacteremia. Findings are concerning for parapneumonic effusion. EXAM: CT-guided chest tube placement MEDICATIONS: The patient is currently admitted to the hospital and receiving intravenous antibiotics. The antibiotics were administered within an appropriate time frame prior to the initiation of the procedure. ANESTHESIA/SEDATION: Fentanyl 50 mcg IV; Versed 1 mg IV Moderate Sedation Time:  10 minutes The patient was continuously monitored during the procedure by the interventional radiology nurse under my direct supervision. COMPLICATIONS: None immediate. PROCEDURE: Informed written consent was obtained from the patient after a thorough discussion of the procedural risks, benefits and alternatives. All questions were addressed. Maximal Sterile Barrier Technique was utilized including caps, mask, sterile gowns, sterile gloves, sterile drape, hand hygiene and skin antiseptic. A timeout was performed prior to the initiation of the procedure. A planning axial CT scan was performed. The large loculated right-sided pleural effusion was identified. A suitable skin entry site and an anterior intercostal space was identified. The skin was marked. After sterile prep and drape using chlorhexidine skin prep, local anesthesia was attained by  infiltration with 1% lidocaine. A small dermatotomy was made. Using trocar technique, a Cook 16 French all-purpose drainage catheter was advanced over the rib and into the pleural space. The catheter   was connected to low wall suction via a Serra pleura vac device. Nearly 2 L of pleural fluid was successfully aspirated in the first few minutes. Follow-up CT imaging demonstrates a well-positioned chest tube. No immediate complication. Tube was secured to the skin with 0 Prolene suture. An air tight sterile dressing was applied. IMPRESSION: Successful placement of a 16 French chest tube into the loculated right pleural effusion. Evacuation yields cloudy yellow fluid. Samples were sent for Gram stain and culture. Electronically Signed   By: Heath  McCullough M.D.   On: 06/28/2020 15:14    Scheduled Meds: . heparin  5,000 Units Subcutaneous Q8H  . mouth rinse  15 mL Mouth Rinse BID  . multivitamin with minerals  1 tablet Oral Daily   Continuous Infusions: . ampicillin-sulbactam (UNASYN) IV 3 g (06/30/20 0538)  . azithromycin 500 mg (06/29/20 1509)     LOS: 1 day   Time spent: 40- minutes.  Noah B Wouk, MD [facility]  If 7PM-7AM, please contact night-coverage [email or web address]  06/30/2020, 10:40 AM   

## 2020-07-01 ENCOUNTER — Inpatient Hospital Stay: Payer: Medicare HMO

## 2020-07-01 DIAGNOSIS — J181 Lobar pneumonia, unspecified organism: Secondary | ICD-10-CM

## 2020-07-01 DIAGNOSIS — J869 Pyothorax without fistula: Secondary | ICD-10-CM

## 2020-07-01 DIAGNOSIS — F17200 Nicotine dependence, unspecified, uncomplicated: Secondary | ICD-10-CM | POA: Diagnosis not present

## 2020-07-01 DIAGNOSIS — R918 Other nonspecific abnormal finding of lung field: Secondary | ICD-10-CM

## 2020-07-01 DIAGNOSIS — R69 Illness, unspecified: Secondary | ICD-10-CM | POA: Diagnosis not present

## 2020-07-01 LAB — BASIC METABOLIC PANEL
Anion gap: 7 (ref 5–15)
BUN: 33 mg/dL — ABNORMAL HIGH (ref 8–23)
CO2: 27 mmol/L (ref 22–32)
Calcium: 8 mg/dL — ABNORMAL LOW (ref 8.9–10.3)
Chloride: 104 mmol/L (ref 98–111)
Creatinine, Ser: 1 mg/dL (ref 0.61–1.24)
GFR, Estimated: >60 mL/min (ref >60)
Glucose, Bld: 124 mg/dL — ABNORMAL HIGH (ref 70–99)
Potassium: 3.9 mmol/L (ref 3.5–5.1)
Sodium: 138 mmol/L (ref 135–145)

## 2020-07-01 LAB — CBC
HCT: 33 % — ABNORMAL LOW (ref 39.0–52.0)
Hemoglobin: 11.1 g/dL — ABNORMAL LOW (ref 13.0–17.0)
MCH: 31.1 pg (ref 26.0–34.0)
MCHC: 33.6 g/dL (ref 30.0–36.0)
MCV: 92.4 fL (ref 80.0–100.0)
Platelets: 334 10*3/uL (ref 150–400)
RBC: 3.57 MIL/uL — ABNORMAL LOW (ref 4.22–5.81)
RDW: 13.4 % (ref 11.5–15.5)
WBC: 13.7 10*3/uL — ABNORMAL HIGH (ref 4.0–10.5)
nRBC: 0 % (ref 0.0–0.2)

## 2020-07-01 MED ORDER — ENOXAPARIN SODIUM 40 MG/0.4ML ~~LOC~~ SOLN
40.0000 mg | SUBCUTANEOUS | Status: DC
  Administered 2020-07-02 – 2020-07-09 (×8): 40 mg via SUBCUTANEOUS
  Filled 2020-07-01 (×8): qty 0.4

## 2020-07-01 NOTE — Progress Notes (Signed)
PROGRESS NOTE    Luis Haney  TKZ:[id number] DOB: February 23, 1951 DOA: 06/28/2020 PCP: [name]   Brief Narrative: Taken from H&P [name] is a 69 y.o. male with medical history significant for left lower back pain, history of right rotator cuff tendinitis, COPD, erectile dysfunction, presented to the emergency department for chief concerns of right-sided upper back pain and shortness of breath.  Patient is a current smoker.  There is an increase in his cough.  No fever or chills. CT chest with right lower lobe infiltrate/pneumonia along with a loculated infusion s/p chest tube placement by IR.  Initial labs with exudative fluid, pending culture. Also found to have a spiculated 10 mm right upper lobe nodule which will need further evaluation as an outpatient once recovered from this acute illness. Blood cultures negative so far.  Subjective: Patient thinkns improving. Sob and cough improving. No fevers. Tolerating diet.  Assessment & Plan:   Principal Problem:   Pleural effusion, right Active Problems:   Mass of upper lobe of right lung   Tobacco use disorder, moderate, dependence   Neutrophilic leukocytosis   Loculated pleural effusion   Lobar pneumonia (HCC)   Empyema lung (HCC)  Severe sepsis with pneumonia and empyema.  Initially met severe sepsis criteria with tachypnea, leukocytosis and chest imaging concerning for pneumonia and empyema along with AKI.  Blood cultures negative so far. Chest tube was placed by IR, draining well (out 500 yesterday), initial labs with exudative fluid which is consistent with his exudate pleural effusion.  He was started on cefepime and vancomycin.  Remained afebrile.. mrsa screen negative. Symptomatically improving. - s/p vanc/cefepime 12/6, started on unasyn 12/7 - empyema growing pan sensitive strep intermedius, pleural fluid with gram positive cocci likely the same - continue chest tube - pulmonology consulted, appreciate  recs. Advising repeat ct which I have ordered.  Acute hypoxic respiratory failure  Secondary to above process. Now on 4 L. Not on home O2 - wean as able  AKI.  2/2 dehydration, now resolved - po ad lib, now off IV fluids -Monitor renal function -Avoid nephrotoxins.  COPD.  No acute exacerbation. -Continue with as needed bronchodilators.  Tobacco abuse.  Continue to smoke 1 to 1-1/2 pack/day. -Nicotine patch. -Counseling given.  Spiculated pulmonary nodule.  Will need outpatient evaluation with PET scan as recommended by radiology once acute pneumonia resolves. - Cytology was added to pleural effusion labs. - pulm consult as above  Objective: Vitals:   07/01/20 0030 07/01/20 0359 07/01/20 0736 07/01/20 1113  BP: 108/61 (!) 107/59 106/70 (!) 123/93  Pulse: 86 80 79 79  Resp: (!) 21 19 (!) 21 18  Temp:  98.7 F (37.1 C) 98.6 F (37 C) 98.4 F (36.9 C)  TempSrc:  Oral Oral Oral  SpO2: 95%  96% 96%  Weight:  93.9 kg    Height:        Intake/Output Summary (Last 24 hours) at 07/01/2020 1452 Last data filed at 07/01/2020 0600 Gross per 24 hour  Intake 400 ml  Output 850 ml  Net -450 ml   Filed Weights   06/29/20 0412 06/30/20 0252 07/01/20 0359  Weight: 94.2 kg 93.2 kg 93.9 kg    Examination:  General exam: Appears calm and comfortable  Respiratory system: Right chest tube in place with drainage of purulent discharge, decreased breath sounds at right base, scattered rhonchi more prominent on right respiratory effort normal. Cardiovascular system: S1 & S2 heard, RRR.  Gastrointestinal system: Soft,  nontender, nondistended, bowel sounds positive. Central nervous system: Alert and oriented. No focal neurological deficits. Extremities: No edema, no cyanosis, pulses intact and symmetrical. Psychiatry: Judgement and insight appear normal. Mood & affect appropriate.    DVT prophylaxis: lovenox Code Status: Full Family Communication: none @ bedside Disposition Plan:   Status is: Inpatient  Remains inpatient appropriate because:Inpatient level of care appropriate due to severity of illness   Dispo: The patient is from: Home              Anticipated d/c is to: Home              Anticipated d/c date is: 3 or more days              Patient currently is not medically stable to d/c.  Consultants:   pulmonology  IR  Procedures:  Antimicrobials:  Vancomycin 12/6-12/7 Cefepime 12/6-12/7 Zithromax 12/6-12/7 Unasyn 12/7 -  Data Reviewed: I have personally reviewed following labs and imaging studies  CBC: Recent Labs  Lab 06/28/20 0952 06/29/20 0439 06/30/20 0614 07/01/20 0410  WBC 17.5* 11.1* 12.4* 13.7*  NEUTROABS 14.8*  --   --   --   HGB 11.5* 10.7* 11.6* 11.1*  HCT 35.0* 32.5* 34.6* 33.0*  MCV 93.6 95.6 94.0 92.4  PLT 284 268 293 793   Basic Metabolic Panel: Recent Labs  Lab 06/28/20 0952 06/29/20 0439 06/30/20 0614 07/01/20 0410  NA 133* 135 137 138  K 4.7 4.3 4.0 3.9  CL 98 102 103 104  CO2 _0 GLUCOSE 131* 100* 118* 124*  BUN 55* 56* 43* 33*  CREATININE 3.44* 2.34* 1.35* 1.00  CALCIUM 9.0 8.4* 8.2* 8.0*  MG 2.0  --   --   --    GFR: Estimated Creatinine Clearance: 76.6 mL/min (by C-G formula based on SCr of 1 mg/dL). Liver Function Tests: Recent Labs  Lab 06/28/20 0952  AST 17  ALT 11  ALKPHOS 46  BILITOT 0.6  PROT 6.4*  ALBUMIN 2.8*   No results for input(s): LIPASE, AMYLASE in the last 168 hours. No results for input(s): AMMONIA in the last 168 hours. Coagulation Profile: Recent Labs  Lab 06/28/20 0952  INR 1.4*   Cardiac Enzymes: No results for input(s): CKTOTAL, CKMB, CKMBINDEX, TROPONINI in the last 168 hours. BNP (last 3 results) No results for input(s): PROBNP in the last 8760 hours. HbA1C: No results for input(s): HGBA1C in the last 72 hours. CBG: No results for input(s): GLUCAP in the last 168 hours. Lipid Profile: No results for input(s): CHOL, HDL, LDLCALC, TRIG, CHOLHDL,  LDLDIRECT in the last 72 hours. Thyroid Function Tests: No results for input(s): TSH, T4TOTAL, FREET4, T3FREE, THYROIDAB in the last 72 hours. Anemia Panel: No results for input(s): VITAMINB12, FOLATE, FERRITIN, TIBC, IRON, RETICCTPCT in the last 72 hours. Sepsis Labs: Recent Labs  Lab 06/28/20 9030 06/28/20 1229  LATICACIDVEN 1.2 1.5    Recent Results (from the past 240 hour(s))  Urine culture     Status: None   Collection Time: 06/28/20  9:36 AM   Specimen: In/Out Cath Urine  Result Value Ref Range Status   Specimen Description   Final    IN/OUT CATH URINE Performed at [facility], [address] [place], [place] [address]    Special Requests   Final    NONE Performed at [facility], [address], [place], [place] [address]    Culture   Final    NO GROWTH  Performed at [facility], [place] [address] [place], [place] [address]    Report Status 06/30/2020 FINAL  Final  Resp Panel by RT-PCR (Flu A&B, Covid) Nasopharyngeal Swab     Status: None   Collection Time: 06/28/20  9:52 AM   Specimen: Nasopharyngeal Swab; Nasopharyngeal(NP) swabs in vial transport medium  Result Value Ref Range Status   SARS Coronavirus 2 by RT PCR NEGATIVE NEGATIVE Final    Comment: (NOTE) SARS-CoV-2 target nucleic acids are NOT DETECTED.  The SARS-CoV-2 RNA is generally detectable in upper respiratory specimens during the acute phase of infection. The lowest concentration of SARS-CoV-2 viral copies this assay can detect is 138 copies/mL. A negative result does not preclude SARS-Cov-2 infection and should not be used as the sole basis for treatment or other patient management decisions. A negative result may occur with  improper specimen collection/handling, submission of specimen other than nasopharyngeal swab, presence of viral mutation(s) within the areas targeted by this assay, and inadequate number of viral copies(<138 copies/mL). A negative result must be  combined with clinical observations, patient history, and epidemiological information. The expected result is Negative.  Fact Sheet for Patients:  EntrepreneurPulse.com.au  Fact Sheet for Healthcare Providers:  IncredibleEmployment.be  This test is no t yet approved or cleared by the [place] FDA and  has been authorized for detection and/or diagnosis of SARS-CoV-2 by FDA under an Emergency Use Authorization (EUA). This EUA will remain  in effect (meaning this test can be used) for the duration of the COVID-19 declaration under Section 564(b)(1) of the Act, 21 U.S.C.section 360bbb-3(b)(1), unless the authorization is terminated  or revoked sooner.       Influenza A by PCR NEGATIVE NEGATIVE Final   Influenza B by PCR NEGATIVE NEGATIVE Final    Comment: (NOTE) The Xpert Xpress SARS-CoV-2/FLU/RSV plus assay is intended as an aid in the diagnosis of influenza from Nasopharyngeal swab specimens and should not be used as a sole basis for treatment. Nasal washings and aspirates are unacceptable for Xpert Xpress SARS-CoV-2/FLU/RSV testing.  Fact Sheet for Patients: EntrepreneurPulse.com.au  Fact Sheet for Healthcare Providers: IncredibleEmployment.be  This test is not yet approved or cleared by the [place] FDA and has been authorized for detection and/or diagnosis of SARS-CoV-2 by FDA under an Emergency Use Authorization (EUA). This EUA will remain in effect (meaning this test can be used) for the duration of the COVID-19 declaration under Section 564(b)(1) of the Act, 21 U.S.C. section 360bbb-3(b)(1), unless the authorization is terminated or revoked.  Performed at [facility], [address] [place], [place] [address]   Blood culture (routine x 2)     Status: None (Preliminary result)   Collection Time: 06/28/20  9:52 AM   Specimen: BLOOD RIGHT HAND  Result Value Ref Range Status    Specimen Description BLOOD RIGHT HAND  Final   Special Requests   Final    BOTTLES DRAWN AEROBIC AND ANAEROBIC Blood Culture adequate volume   Culture   Final    NO GROWTH 3 DAYS Performed at [facility], [address] [place], [place] [address]    Report Status PENDING  Incomplete  Blood culture (routine x 2)     Status: None (Preliminary result)   Collection Time: 06/28/20  9:52 AM   Specimen: BLOOD RIGHT ARM  Result Value Ref Range Status   Specimen Description BLOOD RIGHT ARM  Final   Special Requests   Final    BOTTLES DRAWN AEROBIC AND  ANAEROBIC Blood Culture adequate volume   Culture   Final    NO GROWTH 3 DAYS Performed at [facility], [address] [place], [place] [address]    Report Status PENDING  Incomplete  Body fluid culture     Status: None (Preliminary result)   Collection Time: 06/28/20  2:17 PM   Specimen: Pleura; Body Fluid  Result Value Ref Range Status   Specimen Description   Final    PLEURAL Performed at [facility], [address] [place], [place] [address]    Special Requests   Final    NONE Performed at [facility], [address] [place], [place] [address]    Gram Stain NO WBC SEEN NO ORGANISMS SEEN   Final   Culture   Final    RARE GRAM POSITIVE COCCI IDENTIFICATION AND SUSCEPTIBILITIES TO FOLLOW CRITICAL RESULT CALLED TO, READ BACK BY AND VERIFIED WITH: RN A.LEE AT 7793 ON 07/01/2020 BY T.SAAD Performed at [facility], [place] [address] [place], [place]  [address]    Report Status PENDING  Incomplete  Aerobic/Anaerobic Culture (surgical/deep wound)     Status: None (Preliminary result)   Collection Time: 06/28/20  2:17 PM   Specimen: Abscess  Result Value Ref Range Status   Specimen Description   Final    ABSCESS Performed at [facility], [address] [place], [place] [address]    Special Requests   Final    EMPYEMA Performed at [facility], [address] [place], [place] [address]    Gram Stain   Final    ABUNDANT WBC PRESENT,BOTH PMN AND MONONUCLEAR NO ORGANISMS SEEN Performed at [facility], [place] [address] [place], [place] [address]    Culture   Final    RARE STREPTOCOCCUS INTERMEDIUS NO ANAEROBES ISOLATED; CULTURE IN PROGRESS FOR 5 DAYS    Report Status PENDING  Incomplete   Organism ID, Bacteria STREPTOCOCCUS INTERMEDIUS  Final      Susceptibility   Streptococcus intermedius - MIC*    PENICILLIN <=0.06 SENSITIVE Sensitive     CEFTRIAXONE <=0.12 SENSITIVE Sensitive     ERYTHROMYCIN <=0.12 SENSITIVE Sensitive     LEVOFLOXACIN 0.5 SENSITIVE Sensitive     VANCOMYCIN 0.5 SENSITIVE Sensitive     * RARE STREPTOCOCCUS INTERMEDIUS  MRSA PCR Screening     Status: None   Collection Time: 06/29/20 11:55 AM   Specimen: Nasal Mucosa; Nasopharyngeal  Result Value Ref Range Status   MRSA by PCR NEGATIVE NEGATIVE Final    Comment:        The GeneXpert MRSA Assay (FDA approved for NASAL specimens only), is one component of a comprehensive MRSA colonization surveillance program. It is not intended to diagnose MRSA infection nor to guide or monitor treatment for MRSA infections. Performed at [facility], [address], [place],  [address]      Radiology Studies: No results found.  Scheduled Meds: . heparin  5,000 Units Subcutaneous Q8H  . mouth rinse  15 mL Mouth Rinse BID  . multivitamin with minerals  1 tablet Oral Daily  . nicotine  14 mg Transdermal Daily   Continuous Infusions: . ampicillin-sulbactam (UNASYN) IV 3 g (07/01/20 1146)     LOS: 2 days   Time spent: 30 minutes.  Desma Maxim, MD [facility]  If 7PM-7AM, please contact night-coverage [email or web address]  07/01/2020, 2:52 PM

## 2020-07-01 NOTE — Consult Note (Signed)
Pulmonary Critical Care  Initial Consult Note  Luis Haney WRU:[id number] DOB: 14-May-1951 DOA: 06/28/2020  Referring physician: Dr. Tobie Poet  Chief Complaint: Pleural effusion  HPI: Luis Haney is a 69 y.o. male with a past medical history significant for chronic back pain COPD scented to the hospital because of pain in the back.  In the ED patient was evaluated had a chest x-ray done found to have a large pleural effusion on the right side.  There was also some concern of a mass in the upper lobe of the right lung.  This mass was measured at 10 mm on the CT scan and this CT scan was done prior to the patient's chest tube drainage.  Patient right now states he does feel better had drainage done of the pleural effusion with subsequent chest x-ray showing a rounded opacity in the lower chest.  This may very well be loculated fluid which is left over from the main chest tube drainage.  He still is somewhat congested with a cough bringing up some sputum.  Analysis of the fluid showed a glucose of less than 20 LDH of 1970 and a protein of 4.8 which would be consistent with empyema.  I do not see the pH having been done  Review of Systems:  Constitutional:  No weight loss, night sweats, Fevers, chills, fatigue.  HEENT:  No headaches, nasal congestion, post nasal drip,  Cardio-vascular:  No chest pain, Orthopnea, PND, swelling in lower extremities, anasarca, dizziness, palpitations  GI:  No heartburn, indigestion, abdominal pain, nausea, vomiting, diarrhea  Resp:  +shortness of breath with exertion or at rest. +productive cough, No coughing up of blood.No wheezing Skin:  no rash or lesions.  Musculoskeletal:  No joint pain or swelling.   Remainder ROS performed and is unremarkable other than noted in HPI  No past medical history on file. Past Surgical History:  Procedure Laterality Date  . BACK SURGERY  2000  . NECK SURGERY  2012   Social History:  reports that he has been smoking  cigarettes. He has been smoking about 0.25 packs per day. He has never used smokeless tobacco. He reports current alcohol use. He reports previous drug use.  No Known Allergies  Family History  Problem Relation Age of Onset  . Heart disease Mother   . Other Father        "old age"    Prior to Admission medications   Medication Sig Start Date End Date Taking? Authorizing Provider  calcium carbonate (OS-CAL - DOSED IN MG OF ELEMENTAL CALCIUM) 1250 (500 Ca) MG tablet Take 1 tablet by mouth.   Yes [provider]  Multiple Vitamin (MULTI VITAMIN MENS PO) Take by mouth.   Yes [provider]  Potassium 99 MG TABS Take 99 mg by mouth daily.   Yes [provider]   Physical Exam: Vitals:   06/30/20 2028 07/01/20 0030 07/01/20 0359 07/01/20 0736  BP: (!) 98/49 108/61 (!) 107/59 106/70  Pulse: 81 86 80 79  Resp: 20 (!) 21 19 (!) 21  Temp: 98.7 F (37.1 C)  98.7 F (37.1 C) 98.6 F (37 C)  TempSrc: Oral  Oral Oral  SpO2: 96% 95%  96%  Weight:   93.9 kg   Height:        Wt Readings from Last 3 Encounters:  07/01/20 93.9 kg  06/28/20 90.7 kg  03/05/20 89.4 kg    General:  Appears calm and comfortable Eyes: PERRL, normal lids, irises &  conjunctiva ENT: grossly normal hearing, lips & tongue Neck: no LAD, masses or thyromegaly Cardiovascular: RRR, no m/r/g. No LE edema. Respiratory: CTA left greater than right, no w/r/r.       Normal respiratory effort. Abdomen: soft, nontender Skin: no rash or induration seen on limited exam Musculoskeletal: grossly normal tone BUE/BLE Psychiatric: grossly normal mood and affect Neurologic: grossly non-focal.          Labs on Admission:  Basic Metabolic Panel: Recent Labs  Lab 06/28/20 0952 06/29/20 0439 06/30/20 0614 07/01/20 0410  NA 133* 135 137 138  K 4.7 4.3 4.0 3.9  CL 98 102 103 104  CO2 23 22 25 27   GLUCOSE 131* 100* 118* 124*  BUN 55* 56* 43* 33*  CREATININE 3.44* 2.34* 1.35* 1.00  CALCIUM 9.0  8.4* 8.2* 8.0*  MG 2.0  --   --   --    Liver Function Tests: Recent Labs  Lab 06/28/20 0952  AST 17  ALT 11  ALKPHOS 46  BILITOT 0.6  PROT 6.4*  ALBUMIN 2.8*   No results for input(s): LIPASE, AMYLASE in the last 168 hours. No results for input(s): AMMONIA in the last 168 hours. CBC: Recent Labs  Lab 06/28/20 0952 06/29/20 0439 06/30/20 0614 07/01/20 0410  WBC 17.5* 11.1* 12.4* 13.7*  NEUTROABS 14.8*  --   --   --   HGB 11.5* 10.7* 11.6* 11.1*  HCT 35.0* 32.5* 34.6* 33.0*  MCV 93.6 95.6 94.0 92.4  PLT 284 268 293 334   Cardiac Enzymes: No results for input(s): CKTOTAL, CKMB, CKMBINDEX, TROPONINI in the last 168 hours.  BNP (last 3 results) No results for input(s): BNP in the last 8760 hours.  ProBNP (last 3 results) No results for input(s): PROBNP in the last 8760 hours.  CBG: No results for input(s): GLUCAP in the last 168 hours.  Radiological Exams on Admission: DG Chest Port 1 View  Result Date: 06/29/2020 CLINICAL DATA:  Shortness of breath.  Right chest tube. EXAM: PORTABLE CHEST 1 VIEW COMPARISON:  Chest radiograph and chest CT 06/28/2020. Chest radiograph 07/30/2018 FINDINGS: Pigtail drain has been placed in the right chest. Markedly improved aeration in the right lung compatible with removal of pleural fluid. There is a rounded opacity in the right lower chest that measures up to 5.0 cm. Residual densities along the periphery of the right lower chest most likely represents pleural-based densities. Negative for right pneumothorax. Left lung is clear without pneumothorax. Heart size is within normal limits and stable. The trachea is midline. Surgical hardware in the lower cervical spine. IMPRESSION: 1. Markedly improved aeration in the right lung following placement of the right chest tube. Negative for pneumothorax. 2. Large rounded opacity in the right lower chest measuring at least 5.0 cm in diameter. This rounded opacity may represent a focus of loculated  pleural fluid but a mass lesion cannot be excluded although there was no gross abnormality in this area on the study from January 2020. Recommend continued follow-up and patient will likely need follow-up chest CT. Follow-up chest CT with IV contrast would be ideal but probably not feasible due to patient's renal function. Electronically Signed   By: Markus Daft M.D.   On: 06/29/2020 09:33    EKG: Independently reviewed.  Assessment/Plan Principal Problem:   Pleural effusion, right Active Problems:   Mass of upper lobe of right lung   Tobacco use disorder, moderate, dependence   Neutrophilic leukocytosis   Loculated pleural effusion   Lobar pneumonia (  [place])   Empyema lung ([place])   1. Empyema patient is status post chest tube drainage has left over rounded collection likely of fluid on the right side I would recommend doing a repeat CT scan of the chest to demonstrate clearing.  Clinically he is starting to show improvement.  Once the drainage is adequately controlled may consider removing a chest tube.  I would like to do a follow-up CT scan to reassess the round density that is noted in the lower lobe.  Again this may be a collection of fluid however mass needs to be ruled out. 2. Pulmonary nodule there is a 10 mm nodule noted in the upper lobe this again can be followed up as an outpatient after discharge.  We will be happy to see him in the office 3. COPD continue with bronchodilators as ordered. 4. Lobar pneumonia patient needs to be continued with IV antibiotics.  After discharge he may follow-up in the office and we will to follow-up on CT scanning to demonstrate clearing of the empyema.  If it does not then might need to have CT surgery reassessed the patient  Code Status: Full code  Family Communication: No family at the bedside Disposition Plan: Home with follow-up in the office please call [phone] to make an appointment after discharge  Time spent: 45 minutes  I have  personally obtained a history, examined the patient, evaluated laboratory and imaging results, formulated the assessment and plan and placed orders.  The Patient requires high complexity decision making for assessment and support. Total Time Spent 58min   Amiel Sharrow A Eveny Anastas, MD [facility] Pulmonary Critical Care Medicine Sleep Medicine

## 2020-07-01 NOTE — Progress Notes (Addendum)
Lab called to report pleural fluid cultures growing rare gram positive cocci. MD made aware, see new orders. Will continue to monitor.

## 2020-07-02 ENCOUNTER — Encounter: Payer: Self-pay | Admitting: Internal Medicine

## 2020-07-02 LAB — CBC
HCT: 33.9 % — ABNORMAL LOW (ref 39.0–52.0)
Hemoglobin: 11.2 g/dL — ABNORMAL LOW (ref 13.0–17.0)
MCH: 30.9 pg (ref 26.0–34.0)
MCHC: 33 g/dL (ref 30.0–36.0)
MCV: 93.4 fL (ref 80.0–100.0)
Platelets: 316 10*3/uL (ref 150–400)
RBC: 3.63 MIL/uL — ABNORMAL LOW (ref 4.22–5.81)
RDW: 13.9 % (ref 11.5–15.5)
WBC: 16.2 10*3/uL — ABNORMAL HIGH (ref 4.0–10.5)
nRBC: 0 % (ref 0.0–0.2)

## 2020-07-02 LAB — BASIC METABOLIC PANEL
Anion gap: 8 (ref 5–15)
BUN: 25 mg/dL — ABNORMAL HIGH (ref 8–23)
CO2: 27 mmol/L (ref 22–32)
Calcium: 7.7 mg/dL — ABNORMAL LOW (ref 8.9–10.3)
Chloride: 101 mmol/L (ref 98–111)
Creatinine, Ser: 0.91 mg/dL (ref 0.61–1.24)
GFR, Estimated: >60 mL/min (ref >60)
Glucose, Bld: 119 mg/dL — ABNORMAL HIGH (ref 70–99)
Potassium: 3.7 mmol/L (ref 3.5–5.1)
Sodium: 136 mmol/L (ref 135–145)

## 2020-07-02 MED ORDER — SODIUM CHLORIDE 0.9 % IV SOLN
INTRAVENOUS | Status: DC | PRN
  Administered 2020-07-02 – 2020-07-05 (×4): 250 mL via INTRAVENOUS

## 2020-07-02 NOTE — Care Management Important Message (Signed)
Important Message  Patient Details  Name: Luis Haney MRN: [id number] Date of Birth: 12/10/1950   Medicare Important Message Given:  Yes     Dannette Barbara 07/02/2020, 1:37 PM

## 2020-07-02 NOTE — Progress Notes (Signed)
   07/02/20 1624  Assess: MEWS Score  Temp 98.3 F (36.8 C)  BP (!) 98/55  Pulse Rate 97  ECG Heart Rate 90  Resp (!) 26  SpO2 97 %  O2 Device Nasal Cannula  O2 Flow Rate (L/min) 3 L/min  Assess: MEWS Score  MEWS Temp 0  MEWS Systolic 1  MEWS Pulse 0  MEWS RR 2  MEWS LOC 0  MEWS Score 3  MEWS Score Color Yellow  Assess: if the MEWS score is Yellow or Red  Were vital signs taken at a resting state? Yes  Focused Assessment No change from prior assessment  Early Detection of Sepsis Score *See Row Information* Low  MEWS guidelines implemented *See Row Information* Yes  Treat  MEWS Interventions Other (Comment) (repositioned, replaced O2, checked chest tube)  Pain Scale 0-10  Pain Score 0  Notify: Charge Nurse/RN  Name of Charge Nurse/RN Notified Juan, RN  Date Charge Nurse/RN Notified 07/02/20  Time Charge Nurse/RN Notified 0430  Document  Patient Outcome Stabilized after interventions  Progress note created (see row info) Yes

## 2020-07-02 NOTE — Progress Notes (Signed)
PROGRESS NOTE    Luis Haney  IBB:[id number] DOB: 1951/01/10 DOA: 06/28/2020 PCP: [name]   Brief Narrative: Taken from H&P Luis Haney is a 69 y.o. male with medical history significant for left lower back pain, history of right rotator cuff tendinitis, COPD, erectile dysfunction, presented to the emergency department for chief concerns of right-sided upper back pain and shortness of breath.  Patient is a current smoker.  There is an increase in his cough.  No fever or chills. CT chest with right lower lobe infiltrate/pneumonia along with a loculated infusion s/p chest tube placement by IR.  Initial labs with exudative fluid, pending culture. Also found to have a spiculated 10 mm right upper lobe nodule which will need further evaluation as an outpatient once recovered from this acute illness. Blood cultures negative so far.  Subjective: Patient thinkns improving. Sob and cough improving. No fevers. Tolerating diet.  Assessment & Plan:   Principal Problem:   Pleural effusion, right Active Problems:   Mass of upper lobe of right lung   Tobacco use disorder, moderate, dependence   Neutrophilic leukocytosis   Loculated pleural effusion   Lobar pneumonia (HCC)   Empyema lung (HCC)  Severe sepsis with pneumonia and empyema.  Initially met severe sepsis criteria with tachypnea, leukocytosis and chest imaging concerning for pneumonia and empyema along with AKI.  Blood cultures negative so far. Chest tube was placed by IR, draining well (out 400 yesterday), initial labs with exudative fluid which is consistent with his exudate pleural effusion.  He was started on cefepime and vancomycin.  Remained afebrile.. mrsa screen negative. Symptomatically improving. Repeat CT on 12/9 shows decreased size of parapneumonic effusion. - s/p vanc/cefepime 12/6, started on unasyn 12/7 - empyema growing pan sensitive strep intermedius, pleural fluid with gram positive cocci likely the  same - continue chest tube, output will need to be below 100 for a few days before we will consider pulling.  Acute hypoxic respiratory failure  Secondary to above process. Now on 4 L. Not on home O2 - wean as able  AKI.  2/2 dehydration, now resolved - po ad lib, now off IV fluids -Monitor renal function -Avoid nephrotoxins.  COPD.  No acute exacerbation. -Continue with as needed bronchodilators.  Tobacco abuse.  Continue to smoke 1 to 1-1/2 pack/day. -Nicotine patch. -Counseling given.  Spiculated pulmonary nodule.  Will need outpatient evaluation with PET scan as recommended by radiology once acute pneumonia resolves. - Cytology was added to pleural effusion labs but doesn't appear has been added on, will ask nursing to draw.  - pulm consult as above  Objective: Vitals:   07/01/20 1922 07/02/20 0114 07/02/20 0353 07/02/20 0900  BP: (!) 107/57 124/67 123/72 (!) 109/59  Pulse: 81 86 77 79  Resp:  (!) _0 Temp: 98.3 F (36.8 C) 99.7 F (37.6 C) 99.6 F (37.6 C) (!) 97.4 F (36.3 C)  TempSrc: Oral   Oral  SpO2: 96% 96% 97% 96%  Weight:      Height:        Intake/Output Summary (Last 24 hours) at 07/02/2020 1327 Last data filed at 07/02/2020 0636 Gross per 24 hour  Intake 0 ml  Output 1350 ml  Net -1350 ml   Filed Weights   06/29/20 0412 06/30/20 0252 07/01/20 0359  Weight: 94.2 kg 93.2 kg 93.9 kg    Examination:  General exam: Appears calm and comfortable  Respiratory system: Right chest tube in place with drainage  of purulent discharge, decreased breath sounds at right base, scattered rhonchi more prominent on right respiratory effort normal. Cardiovascular system: S1 & S2 heard, RRR.  Gastrointestinal system: Soft, nontender, nondistended, bowel sounds positive. Central nervous system: Alert and oriented. No focal neurological deficits. Extremities: No edema, no cyanosis, pulses intact and symmetrical. Psychiatry: Judgement and insight appear normal.  Mood & affect appropriate.    DVT prophylaxis: lovenox Code Status: Full Family Communication: none @ bedside Disposition Plan:  Status is: Inpatient  Remains inpatient appropriate because:Inpatient level of care appropriate due to severity of illness   Dispo: The patient is from: Home              Anticipated d/c is to: Home              Anticipated d/c date is: 3 or more days              Patient currently is not medically stable to d/c.  Consultants:   pulmonology  IR  Procedures:  Antimicrobials:  Vancomycin 12/6-12/7 Cefepime 12/6-12/7 Zithromax 12/6-12/7 Unasyn 12/7 -  Data Reviewed: I have personally reviewed following labs and imaging studies  CBC: Recent Labs  Lab 06/28/20 0952 06/29/20 0439 06/30/20 0614 07/01/20 0410 07/02/20 0523  WBC 17.5* 11.1* 12.4* 13.7* 16.2*  NEUTROABS 14.8*  --   --   --   --   HGB 11.5* 10.7* 11.6* 11.1* 11.2*  HCT 35.0* 32.5* 34.6* 33.0* 33.9*  MCV 93.6 95.6 94.0 92.4 93.4  PLT 284 268 293 334 101   Basic Metabolic Panel: Recent Labs  Lab 06/28/20 0952 06/29/20 0439 06/30/20 0614 07/01/20 0410 07/02/20 0523  NA 133* 135 137 138 136  K 4.7 4.3 4.0 3.9 3.7  CL 98 102 103 104 101  CO2 _0 GLUCOSE 131* 100* 118* 124* 119*  BUN 55* 56* 43* 33* 25*  CREATININE 3.44* 2.34* 1.35* 1.00 0.91  CALCIUM 9.0 8.4* 8.2* 8.0* 7.7*  MG 2.0  --   --   --   --    GFR: Estimated Creatinine Clearance: 84.1 mL/min (by C-G formula based on SCr of 0.91 mg/dL). Liver Function Tests: Recent Labs  Lab 06/28/20 0952  AST 17  ALT 11  ALKPHOS 46  BILITOT 0.6  PROT 6.4*  ALBUMIN 2.8*   No results for input(s): LIPASE, AMYLASE in the last 168 hours. No results for input(s): AMMONIA in the last 168 hours. Coagulation Profile: Recent Labs  Lab 06/28/20 0952  INR 1.4*   Cardiac Enzymes: No results for input(s): CKTOTAL, CKMB, CKMBINDEX, TROPONINI in the last 168 hours. BNP (last 3 results) No results for input(s):  PROBNP in the last 8760 hours. HbA1C: No results for input(s): HGBA1C in the last 72 hours. CBG: No results for input(s): GLUCAP in the last 168 hours. Lipid Profile: No results for input(s): CHOL, HDL, LDLCALC, TRIG, CHOLHDL, LDLDIRECT in the last 72 hours. Thyroid Function Tests: No results for input(s): TSH, T4TOTAL, FREET4, T3FREE, THYROIDAB in the last 72 hours. Anemia Panel: No results for input(s): VITAMINB12, FOLATE, FERRITIN, TIBC, IRON, RETICCTPCT in the last 72 hours. Sepsis Labs: Recent Labs  Lab 06/28/20 7510 06/28/20 1229  LATICACIDVEN 1.2 1.5    Recent Results (from the past 240 hour(s))  Urine culture     Status: None   Collection Time: 06/28/20  9:36 AM   Specimen: In/Out Cath Urine  Result Value Ref Range Status   Specimen Description   Final  IN/OUT CATH URINE Performed at [facility], [address] [place], [place] [address]    Special Requests   Final    NONE Performed at [facility], [address] [place], [place] [address]    Culture   Final    NO GROWTH Performed at [facility], [place] [address], [place], [place] [address]    Report Status 06/30/2020 FINAL  Final  Resp Panel by RT-PCR (Flu A&B, Covid) Nasopharyngeal Swab     Status: None   Collection Time: 06/28/20  9:52 AM   Specimen: Nasopharyngeal Swab; Nasopharyngeal(NP) swabs in vial transport medium  Result Value Ref Range Status   SARS Coronavirus 2 by RT PCR NEGATIVE NEGATIVE Final    Comment: (NOTE) SARS-CoV-2 target nucleic acids are NOT DETECTED.  The SARS-CoV-2 RNA is generally detectable in upper respiratory specimens during the acute phase of infection. The lowest concentration of SARS-CoV-2 viral copies this assay can detect is 138 copies/mL. A negative result does not preclude SARS-Cov-2 infection and should not be used as the sole basis for treatment or other patient management decisions. A negative result may occur with  improper specimen  collection/handling, submission of specimen other than nasopharyngeal swab, presence of viral mutation(s) within the areas targeted by this assay, and inadequate number of viral copies(<138 copies/mL). A negative result must be combined with clinical observations, patient history, and epidemiological information. The expected result is Negative.  Fact Sheet for Patients:  EntrepreneurPulse.com.au  Fact Sheet for Healthcare Providers:  IncredibleEmployment.be  This test is no t yet approved or cleared by the [place] FDA and  has been authorized for detection and/or diagnosis of SARS-CoV-2 by FDA under an Emergency Use Authorization (EUA). This EUA will remain  in effect (meaning this test can be used) for the duration of the COVID-19 declaration under Section 564(b)(1) of the Act, 21 U.S.C.section 360bbb-3(b)(1), unless the authorization is terminated  or revoked sooner.       Influenza A by PCR NEGATIVE NEGATIVE Final   Influenza B by PCR NEGATIVE NEGATIVE Final    Comment: (NOTE) The Xpert Xpress SARS-CoV-2/FLU/RSV plus assay is intended as an aid in the diagnosis of influenza from Nasopharyngeal swab specimens and should not be used as a sole basis for treatment. Nasal washings and aspirates are unacceptable for Xpert Xpress SARS-CoV-2/FLU/RSV testing.  Fact Sheet for Patients: EntrepreneurPulse.com.au  Fact Sheet for Healthcare Providers: IncredibleEmployment.be  This test is not yet approved or cleared by the [place] FDA and has been authorized for detection and/or diagnosis of SARS-CoV-2 by FDA under an Emergency Use Authorization (EUA). This EUA will remain in effect (meaning this test can be used) for the duration of the COVID-19 declaration under Section 564(b)(1) of the Act, 21 U.S.C. section 360bbb-3(b)(1), unless the authorization is terminated or revoked.  Performed at [facility], [address] [place], [place] [address]   Blood culture (routine x 2)     Status: None (Preliminary result)   Collection Time: 06/28/20  9:52 AM   Specimen: BLOOD RIGHT HAND  Result Value Ref Range Status   Specimen Description BLOOD RIGHT HAND  Final   Special Requests   Final    BOTTLES DRAWN AEROBIC AND ANAEROBIC Blood Culture adequate volume   Culture   Final    NO GROWTH 4 DAYS Performed at [facility], [address], [place], [place] [address]    Report Status PENDING  Incomplete  Blood culture (routine x 2)  Status: None (Preliminary result)   Collection Time: 06/28/20  9:52 AM   Specimen: BLOOD RIGHT ARM  Result Value Ref Range Status   Specimen Description BLOOD RIGHT ARM  Final   Special Requests   Final    BOTTLES DRAWN AEROBIC AND ANAEROBIC Blood Culture adequate volume   Culture   Final    NO GROWTH 4 DAYS Performed at [facility], [address], [place], [place] [address]    Report Status PENDING  Incomplete  Body fluid culture     Status: None (Preliminary result)   Collection Time: 06/28/20  2:17 PM   Specimen: Pleura; Body Fluid  Result Value Ref Range Status   Specimen Description   Final    PLEURAL Performed at [facility], [address] [place], [place] [address]    Special Requests   Final    NONE Performed at [facility], [address] [place], [place] [address]    Gram Stain NO WBC SEEN NO ORGANISMS SEEN   Final   Culture   Final    RARE STREPTOCOCCUS INTERMEDIUS SUSCEPTIBILITIES TO FOLLOW CRITICAL RESULT CALLED TO, READ BACK BY AND VERIFIED WITH: RN A.LEE AT 7591 ON 07/01/2020 BY T.SAAD Performed at [facility], [place] [address] [place], [place] [address]    Report Status PENDING  Incomplete  Aerobic/Anaerobic Culture (surgical/deep wound)     Status: None (Preliminary result)   Collection Time: 06/28/20  2:17 PM   Specimen: Abscess  Result Value Ref Range Status    Specimen Description   Final    ABSCESS Performed at [facility], [address] [place], [place] [address]    Special Requests   Final    EMPYEMA Performed at [facility], [address] [place], [place] [address]    Gram Stain   Final    ABUNDANT WBC PRESENT,BOTH PMN AND MONONUCLEAR NO ORGANISMS SEEN Performed at [facility], [place] [address] [place],  [address]    Culture   Final    RARE STREPTOCOCCUS INTERMEDIUS NO ANAEROBES ISOLATED; CULTURE IN PROGRESS FOR 5 DAYS    Report Status PENDING  Incomplete   Organism ID, Bacteria STREPTOCOCCUS INTERMEDIUS  Final      Susceptibility   Streptococcus intermedius - MIC*    PENICILLIN <=0.06 SENSITIVE Sensitive     CEFTRIAXONE <=0.12 SENSITIVE Sensitive     ERYTHROMYCIN <=0.12 SENSITIVE Sensitive     LEVOFLOXACIN 0.5 SENSITIVE Sensitive     VANCOMYCIN 0.5 SENSITIVE Sensitive     * RARE STREPTOCOCCUS INTERMEDIUS  MRSA PCR Screening     Status: None   Collection Time: 06/29/20 11:55 AM   Specimen: Nasal Mucosa; Nasopharyngeal  Result Value Ref Range Status   MRSA by PCR NEGATIVE NEGATIVE Final    Comment:        The GeneXpert MRSA Assay (FDA approved for NASAL specimens only), is one component of a comprehensive MRSA colonization surveillance program. It is not intended to diagnose MRSA infection nor to guide or monitor treatment for MRSA infections. Performed at [facility], [address], [place],  [address]      Radiology Studies: CT CHEST WO CONTRAST  Result Date: 07/01/2020 CLINICAL DATA:  [place]ema EXAM: CT CHEST WITHOUT CONTRAST TECHNIQUE: Multidetector CT imaging of the chest was performed following the standard protocol without IV contrast. COMPARISON:  CT dated June 28, 2020 FINDINGS: Cardiovascular: The heart size is stable. Aortic calcifications are noted without evidence for  an aneurysm. There are coronary artery calcifications. There is a trace pericardial  effusion. Mediastinum/Nodes: --mildly enlarged mediastinal lymph nodes are noted --there are enlarged right hilar lymph nodes -- No axillary lymphadenopathy. -- No supraclavicular lymphadenopathy. -- Normal thyroid gland where visualized. -  Unremarkable esophagus. Lungs/Pleura: The previously demonstrated right-sided loculated pleural effusion has significantly improved status post right-sided chest tube placement. There remains a moderate-sized loculated right-sided hydropneumothorax. There is a spiculated pulmonary nodule at the right lung apex measuring approximately 1.4 cm (axial series 3, image 32). Emphysematous changes are noted. there is some nodularity along the fissures on the right which is felt to represent trapped fluid as opposed to true pulmonary nodules. There is debris within the trachea. The left lung field is mostly clear aside from a small left-sided pleural effusion with adjacent atelectasis. Upper Abdomen: No acute abnormality identified in the upper abdomen. Musculoskeletal: No chest wall abnormality. No bony spinal canal stenosis. IMPRESSION: 1. Significantly improved appearance of the previously demonstrated right-sided loculated pleural effusion status post right-sided chest tube placement. There remains a moderate-sized loculated right-sided hydropneumothorax. 2. Spiculated 1.4 cm pulmonary nodule at the right lung apex concerning for bronchogenic carcinoma. Pulmonary medicine follow-up is recommended. 3. Enlarged mediastinal and hilar lymph nodes. While these may be reactive, in the setting of a spiculated right upper lobe pulmonary nodule, nodal metastatic disease is not excluded. 4. Small left-sided pleural effusion with adjacent atelectasis. Aortic Atherosclerosis (ICD10-I70.0) and Emphysema (ICD10-J43.9). Electronically Signed   By: Constance Holster M.D.   On: 07/01/2020 19:39    Scheduled Meds: . enoxaparin (LOVENOX) injection  40 mg Subcutaneous Q24H  . mouth rinse  15 mL  Mouth Rinse BID  . multivitamin with minerals  1 tablet Oral Daily  . nicotine  14 mg Transdermal Daily   Continuous Infusions: . ampicillin-sulbactam (UNASYN) IV 3 g (07/02/20 0556)     LOS: 3 days   Time spent: 30 minutes.  Desma Maxim, MD [facility]  If 7PM-7AM, please contact night-coverage [email or web address]  07/02/2020, 1:27 PM

## 2020-07-03 LAB — AEROBIC/ANAEROBIC CULTURE W GRAM STAIN (SURGICAL/DEEP WOUND)

## 2020-07-03 LAB — BASIC METABOLIC PANEL
Anion gap: 8 (ref 5–15)
BUN: 22 mg/dL (ref 8–23)
CO2: 28 mmol/L (ref 22–32)
Calcium: 7.5 mg/dL — ABNORMAL LOW (ref 8.9–10.3)
Chloride: 100 mmol/L (ref 98–111)
Creatinine, Ser: 0.98 mg/dL (ref 0.61–1.24)
GFR, Estimated: >60 mL/min (ref >60)
Glucose, Bld: 122 mg/dL — ABNORMAL HIGH (ref 70–99)
Potassium: 3.7 mmol/L (ref 3.5–5.1)
Sodium: 136 mmol/L (ref 135–145)

## 2020-07-03 LAB — CULTURE, BLOOD (ROUTINE X 2)
Culture: NO GROWTH
Culture: NO GROWTH
Special Requests: ADEQUATE
Special Requests: ADEQUATE

## 2020-07-03 LAB — CBC
HCT: 31.4 % — ABNORMAL LOW (ref 39.0–52.0)
Hemoglobin: 10.7 g/dL — ABNORMAL LOW (ref 13.0–17.0)
MCH: 31.4 pg (ref 26.0–34.0)
MCHC: 34.1 g/dL (ref 30.0–36.0)
MCV: 92.1 fL (ref 80.0–100.0)
Platelets: 332 10*3/uL (ref 150–400)
RBC: 3.41 MIL/uL — ABNORMAL LOW (ref 4.22–5.81)
RDW: 14.1 % (ref 11.5–15.5)
WBC: 18.5 10*3/uL — ABNORMAL HIGH (ref 4.0–10.5)
nRBC: 0 % (ref 0.0–0.2)

## 2020-07-03 MED ORDER — ZOLPIDEM TARTRATE 5 MG PO TABS
5.0000 mg | ORAL_TABLET | Freq: Every evening | ORAL | Status: DC | PRN
  Administered 2020-07-03: 22:00:00 5 mg via ORAL
  Filled 2020-07-03: qty 1

## 2020-07-03 NOTE — Progress Notes (Addendum)
PROGRESS NOTE    Luis Haney  IWL:[id number] DOB: Nov 11, 1950 DOA: 06/28/2020 PCP: [name]   Brief Narrative: Taken from H&P Luis Haney is a 69 y.o. male with medical history significant for left lower back pain, history of right rotator cuff tendinitis, COPD, erectile dysfunction, presented to the emergency department for chief concerns of right-sided upper back pain and shortness of breath.  Patient is a current smoker.  There is an increase in his cough.  No fever or chills. CT chest with right lower lobe infiltrate/pneumonia along with a loculated infusion s/p chest tube placement by IR.  Initial labs with exudative fluid, pending culture. Also found to have a spiculated 10 mm right upper lobe nodule which will need further evaluation as an outpatient once recovered from this acute illness. Blood cultures negative so far.  Subjective: Patient thinkns improving. Sob and cough improving. No fevers. Tolerating diet.  Assessment & Plan:   Principal Problem:   Pleural effusion, right Active Problems:   Mass of upper lobe of right lung   Tobacco use disorder, moderate, dependence   Neutrophilic leukocytosis   Loculated pleural effusion   Lobar pneumonia (HCC)   Empyema lung (HCC)  Severe sepsis with pneumonia and empyema.  Initially met severe sepsis criteria with tachypnea, leukocytosis and chest imaging concerning for pneumonia and empyema along with AKI.  Blood cultures negative so far. Chest tube was placed by IR, draining well (out 400 yesterday), initial labs with exudative fluid which is consistent with his pneumonia  Symptomatically improving. Repeat CT on 12/9 shows decreased size of parapneumonic effusion. Cultures growing strep intermedius. 50 ml of chest tube output 12/10 - s/p vanc/cefepime 12/6, started on unasyn 12/7 - f/u cultures - continue chest tube, output will need to be below 100 for a few days before we will consider pulling. - wbc up-trend  to 18.5 today, will monitor for now given clinically is improving  Acute hypoxic respiratory failure  Secondary to above process. Now down go 2.5 L. Not on home O2 - continue to wean as able  AKI.  2/2 dehydration, now resolved - po ad lib, now off IV fluids -Monitor renal function -Avoid nephrotoxins.  COPD.  No acute exacerbation. -Continue with as needed bronchodilators.  Tobacco abuse.  Continue to smoke 1 to 1-1/2 pack/day. -Nicotine patch. -Counseling given.  Spiculated pulmonary nodule.  Will need outpatient evaluation with PET scan as recommended by radiology once acute pneumonia resolves. - Cytology was added to pleural effusion labs but doesn't appear has been added on, will ask nursing to draw.  - pulm consult as above  Insomnia - will write for qhs prn zolpidem  Objective: Vitals:   07/02/20 2318 07/03/20 0522 07/03/20 0954 07/03/20 1110  BP: (!) 110/46 (!) 105/52 (!) 95/55 (!) 106/52  Pulse: 84 83 72 80  Resp: 16 19 19 20   Temp: 100.3 F (37.9 C) 98.2 F (36.8 C)  97.8 F (36.6 C)  TempSrc: Oral Oral  Oral  SpO2: 99% 99% 98% 97%  Weight:      Height:        Intake/Output Summary (Last 24 hours) at 07/03/2020 1429 Last data filed at 07/03/2020 1358 Gross per 24 hour  Intake 623.55 ml  Output 450 ml  Net 173.55 ml   Filed Weights   06/29/20 0412 06/30/20 0252 07/01/20 0359  Weight: 94.2 kg 93.2 kg 93.9 kg    Examination:  General exam: Appears calm and comfortable  Respiratory system: Right  chest tube in place with drainage of purulent discharge, decreased breath sounds at right base, scattered rhonchi more prominent on right respiratory effort normal. Cardiovascular system: S1 & S2 heard, RRR.  Gastrointestinal system: Soft, nontender, nondistended, bowel sounds positive. Central nervous system: Alert and oriented. No focal neurological deficits. Extremities: No edema, no cyanosis, pulses intact and symmetrical. Psychiatry: Judgement and  insight appear normal. Mood & affect appropriate.    DVT prophylaxis: lovenox Code Status: Full Family Communication: wife updated telephonically 12/11 Disposition Plan:  Status is: Inpatient  Remains inpatient appropriate because:Inpatient level of care appropriate due to severity of illness   Dispo: The patient is from: Home              Anticipated d/c is to: Home              Anticipated d/c date is: 3 or more days              Patient currently is not medically stable to d/c.  Consultants:   pulmonology  IR  Procedures:  Antimicrobials:  Vancomycin 12/6-12/7 Cefepime 12/6-12/7 Zithromax 12/6-12/7 Unasyn 12/7 -  Data Reviewed: I have personally reviewed following labs and imaging studies  CBC: Recent Labs  Lab 06/28/20 0952 06/29/20 0439 06/30/20 0614 07/01/20 0410 07/02/20 0523 07/03/20 0441  WBC 17.5* 11.1* 12.4* 13.7* 16.2* 18.5*  NEUTROABS 14.8*  --   --   --   --   --   HGB 11.5* 10.7* 11.6* 11.1* 11.2* 10.7*  HCT 35.0* 32.5* 34.6* 33.0* 33.9* 31.4*  MCV 93.6 95.6 94.0 92.4 93.4 92.1  PLT 284 268 293 334 316 662   Basic Metabolic Panel: Recent Labs  Lab 06/28/20 0952 06/29/20 0439 06/30/20 0614 07/01/20 0410 07/02/20 0523 07/03/20 0441  NA 133* 135 137 138 136 136  K 4.7 4.3 4.0 3.9 3.7 3.7  CL 98 102 103 104 101 100  CO2 23 22 25 27 27 28   GLUCOSE 131* 100* 118* 124* 119* 122*  BUN 55* 56* 43* 33* 25* 22  CREATININE 3.44* 2.34* 1.35* 1.00 0.91 0.98  CALCIUM 9.0 8.4* 8.2* 8.0* 7.7* 7.5*  MG 2.0  --   --   --   --   --    GFR: Estimated Creatinine Clearance: 78.1 mL/min (by C-G formula based on SCr of 0.98 mg/dL). Liver Function Tests: Recent Labs  Lab 06/28/20 0952  AST 17  ALT 11  ALKPHOS 46  BILITOT 0.6  PROT 6.4*  ALBUMIN 2.8*   No results for input(s): LIPASE, AMYLASE in the last 168 hours. No results for input(s): AMMONIA in the last 168 hours. Coagulation Profile: Recent Labs  Lab 06/28/20 0952  INR 1.4*   Cardiac  Enzymes: No results for input(s): CKTOTAL, CKMB, CKMBINDEX, TROPONINI in the last 168 hours. BNP (last 3 results) No results for input(s): PROBNP in the last 8760 hours. HbA1C: No results for input(s): HGBA1C in the last 72 hours. CBG: No results for input(s): GLUCAP in the last 168 hours. Lipid Profile: No results for input(s): CHOL, HDL, LDLCALC, TRIG, CHOLHDL, LDLDIRECT in the last 72 hours. Thyroid Function Tests: No results for input(s): TSH, T4TOTAL, FREET4, T3FREE, THYROIDAB in the last 72 hours. Anemia Panel: No results for input(s): VITAMINB12, FOLATE, FERRITIN, TIBC, IRON, RETICCTPCT in the last 72 hours. Sepsis Labs: Recent Labs  Lab 06/28/20 0952 06/28/20 1229  LATICACIDVEN 1.2 1.5    Recent Results (from the past 240 hour(s))  Urine culture     Status:  None   Collection Time: 06/28/20  9:36 AM   Specimen: In/Out Cath Urine  Result Value Ref Range Status   Specimen Description   Final    IN/OUT CATH URINE Performed at [facility], [address], [place], [place] [address]    Special Requests   Final    NONE Performed at [facility], [address], [place], [place] [address]    Culture   Final    NO GROWTH Performed at [facility], [place] [address], [place], [place] [address]    Report Status 06/30/2020 FINAL  Final  Resp Panel by RT-PCR (Flu A&B, Covid) Nasopharyngeal Swab     Status: None   Collection Time: 06/28/20  9:52 AM   Specimen: Nasopharyngeal Swab; Nasopharyngeal(NP) swabs in vial transport medium  Result Value Ref Range Status   SARS Coronavirus 2 by RT PCR NEGATIVE NEGATIVE Final    Comment: (NOTE) SARS-CoV-2 target nucleic acids are NOT DETECTED.  The SARS-CoV-2 RNA is generally detectable in upper respiratory specimens during the acute phase of infection. The lowest concentration of SARS-CoV-2 viral copies this assay can detect is 138 copies/mL. A negative result does not preclude SARS-Cov-2 infection and  should not be used as the sole basis for treatment or other patient management decisions. A negative result may occur with  improper specimen collection/handling, submission of specimen other than nasopharyngeal swab, presence of viral mutation(s) within the areas targeted by this assay, and inadequate number of viral copies(<138 copies/mL). A negative result must be combined with clinical observations, patient history, and epidemiological information. The expected result is Negative.  Fact Sheet for Patients:  EntrepreneurPulse.com.au  Fact Sheet for Healthcare Providers:  IncredibleEmployment.be  This test is no t yet approved or cleared by the [place] FDA and  has been authorized for detection and/or diagnosis of SARS-CoV-2 by FDA under an Emergency Use Authorization (EUA). This EUA will remain  in effect (meaning this test can be used) for the duration of the COVID-19 declaration under Section 564(b)(1) of the Act, 21 U.S.C.section 360bbb-3(b)(1), unless the authorization is terminated  or revoked sooner.       Influenza A by PCR NEGATIVE NEGATIVE Final   Influenza B by PCR NEGATIVE NEGATIVE Final    Comment: (NOTE) The Xpert Xpress SARS-CoV-2/FLU/RSV plus assay is intended as an aid in the diagnosis of influenza from Nasopharyngeal swab specimens and should not be used as a sole basis for treatment. Nasal washings and aspirates are unacceptable for Xpert Xpress SARS-CoV-2/FLU/RSV testing.  Fact Sheet for Patients: EntrepreneurPulse.com.au  Fact Sheet for Healthcare Providers: IncredibleEmployment.be  This test is not yet approved or cleared by the [place] FDA and has been authorized for detection and/or diagnosis of SARS-CoV-2 by FDA under an Emergency Use Authorization (EUA). This EUA will remain in effect (meaning this test can be used) for the duration of the COVID-19 declaration  under Section 564(b)(1) of the Act, 21 U.S.C. section 360bbb-3(b)(1), unless the authorization is terminated or revoked.  Performed at [facility], [address] [place], [place] [address]   Blood culture (routine x 2)     Status: None   Collection Time: 06/28/20  9:52 AM   Specimen: BLOOD RIGHT HAND  Result Value Ref Range Status   Specimen Description BLOOD RIGHT HAND  Final   Special Requests   Final    BOTTLES DRAWN AEROBIC AND ANAEROBIC Blood Culture adequate volume   Culture   Final    NO  GROWTH 5 DAYS Performed at [facility], [address] [place], [place] [address]    Report Status 07/03/2020 FINAL  Final  Blood culture (routine x 2)     Status: None   Collection Time: 06/28/20  9:52 AM   Specimen: BLOOD RIGHT ARM  Result Value Ref Range Status   Specimen Description BLOOD RIGHT ARM  Final   Special Requests   Final    BOTTLES DRAWN AEROBIC AND ANAEROBIC Blood Culture adequate volume   Culture   Final    NO GROWTH 5 DAYS Performed at [facility], [address], [place], [place] [address]    Report Status 07/03/2020 FINAL  Final  Body fluid culture     Status: None (Preliminary result)   Collection Time: 06/28/20  2:17 PM   Specimen: Pleura; Body Fluid  Result Value Ref Range Status   Specimen Description   Final    PLEURAL Performed at [facility], [address], [place], [place] [address]    Special Requests   Final    NONE Performed at [facility], [address] [place], [place] [address]    Gram Stain NO WBC SEEN NO ORGANISMS SEEN   Final   Culture   Final    RARE STREPTOCOCCUS INTERMEDIUS SUSCEPTIBILITIES TO FOLLOW REPEATING CRITICAL RESULT CALLED TO, READ BACK BY AND VERIFIED WITH: RN A.LEE AT 2025 ON 07/01/2020 BY T.SAAD Performed at [facility], [place] [address] [place], [place] [address]    Report Status PENDING  Incomplete  Aerobic/Anaerobic Culture (surgical/deep wound)      Status: None   Collection Time: 06/28/20  2:17 PM   Specimen: Abscess  Result Value Ref Range Status   Specimen Description   Final    ABSCESS Performed at [facility], [address] [place], [place] [address]    Special Requests   Final    EMPYEMA Performed at [facility], [address] [place], [place] [address]    Gram Stain   Final    ABUNDANT WBC PRESENT,BOTH PMN AND MONONUCLEAR NO ORGANISMS SEEN    Culture   Final    RARE STREPTOCOCCUS INTERMEDIUS NO ANAEROBES ISOLATED Performed at [facility], [place] [address], [place], [place] [address]    Report Status 07/03/2020 FINAL  Final   Organism ID, Bacteria STREPTOCOCCUS INTERMEDIUS  Final      Susceptibility   Streptococcus intermedius - MIC*    PENICILLIN <=0.06 SENSITIVE Sensitive     CEFTRIAXONE <=0.12 SENSITIVE Sensitive     ERYTHROMYCIN <=0.12 SENSITIVE Sensitive     LEVOFLOXACIN 0.5 SENSITIVE Sensitive     VANCOMYCIN 0.5 SENSITIVE Sensitive     * RARE STREPTOCOCCUS INTERMEDIUS  MRSA PCR Screening     Status: None   Collection Time: 06/29/20 11:55 AM   Specimen: Nasal Mucosa; Nasopharyngeal  Result Value Ref Range Status   MRSA by PCR NEGATIVE NEGATIVE Final    Comment:        The GeneXpert MRSA Assay (FDA approved for NASAL specimens only), is one component of a comprehensive MRSA colonization surveillance program. It is not intended to diagnose MRSA infection nor to guide or monitor treatment for MRSA infections. Performed at [facility], [address] [place],  [address]      Radiology Studies: CT CHEST WO CONTRAST  Result Date: 07/01/2020 CLINICAL DATA:  Empyema EXAM: CT CHEST WITHOUT CONTRAST TECHNIQUE: Multidetector CT imaging of the chest was performed following  the standard protocol without IV contrast. COMPARISON:  CT dated June 28, 2020 FINDINGS: Cardiovascular: The heart size is stable. Aortic calcifications are noted without evidence for an  aneurysm. There are coronary artery calcifications. There is a trace pericardial effusion. Mediastinum/Nodes: --mildly enlarged mediastinal lymph nodes are noted --there are enlarged right hilar lymph nodes -- No axillary lymphadenopathy. -- No supraclavicular lymphadenopathy. -- Normal thyroid gland where visualized. -  Unremarkable esophagus. Lungs/Pleura: The previously demonstrated right-sided loculated pleural effusion has significantly improved status post right-sided chest tube placement. There remains a moderate-sized loculated right-sided hydropneumothorax. There is a spiculated pulmonary nodule at the right lung apex measuring approximately 1.4 cm (axial series 3, image 32). Emphysematous changes are noted. there is some nodularity along the fissures on the right which is felt to represent trapped fluid as opposed to true pulmonary nodules. There is debris within the trachea. The left lung field is mostly clear aside from a small left-sided pleural effusion with adjacent atelectasis. Upper Abdomen: No acute abnormality identified in the upper abdomen. Musculoskeletal: No chest wall abnormality. No bony spinal canal stenosis. IMPRESSION: 1. Significantly improved appearance of the previously demonstrated right-sided loculated pleural effusion status post right-sided chest tube placement. There remains a moderate-sized loculated right-sided hydropneumothorax. 2. Spiculated 1.4 cm pulmonary nodule at the right lung apex concerning for bronchogenic carcinoma. Pulmonary medicine follow-up is recommended. 3. Enlarged mediastinal and hilar lymph nodes. While these may be reactive, in the setting of a spiculated right upper lobe pulmonary nodule, nodal metastatic disease is not excluded. 4. Small left-sided pleural effusion with adjacent atelectasis. Aortic Atherosclerosis (ICD10-I70.0) and Emphysema (ICD10-J43.9). Electronically Signed   By: Constance Holster M.D.   On: 07/01/2020 19:39    Scheduled Meds: .  enoxaparin (LOVENOX) injection  40 mg Subcutaneous Q24H  . mouth rinse  15 mL Mouth Rinse BID  . multivitamin with minerals  1 tablet Oral Daily  . nicotine  14 mg Transdermal Daily   Continuous Infusions: . sodium chloride 250 mL (07/03/20 1417)  . ampicillin-sulbactam (UNASYN) IV 3 g (07/03/20 1418)     LOS: 4 days   Time spent: 25 minutes.  Desma Maxim, MD [facility]  If 7PM-7AM, please contact night-coverage [email or web address]  07/03/2020, 2:29 PM

## 2020-07-04 ENCOUNTER — Inpatient Hospital Stay: Payer: Medicare HMO

## 2020-07-04 LAB — CBC
HCT: 30.9 % — ABNORMAL LOW (ref 39.0–52.0)
Hemoglobin: 10 g/dL — ABNORMAL LOW (ref 13.0–17.0)
MCH: 30.6 pg (ref 26.0–34.0)
MCHC: 32.4 g/dL (ref 30.0–36.0)
MCV: 94.5 fL (ref 80.0–100.0)
Platelets: 358 10*3/uL (ref 150–400)
RBC: 3.27 MIL/uL — ABNORMAL LOW (ref 4.22–5.81)
RDW: 14.3 % (ref 11.5–15.5)
WBC: 19.8 10*3/uL — ABNORMAL HIGH (ref 4.0–10.5)
nRBC: 0 % (ref 0.0–0.2)

## 2020-07-04 LAB — BASIC METABOLIC PANEL
Anion gap: 6 (ref 5–15)
BUN: 19 mg/dL (ref 8–23)
CO2: 31 mmol/L (ref 22–32)
Calcium: 7.5 mg/dL — ABNORMAL LOW (ref 8.9–10.3)
Chloride: 101 mmol/L (ref 98–111)
Creatinine, Ser: 1.02 mg/dL (ref 0.61–1.24)
GFR, Estimated: >60 mL/min (ref >60)
Glucose, Bld: 116 mg/dL — ABNORMAL HIGH (ref 70–99)
Potassium: 4.2 mmol/L (ref 3.5–5.1)
Sodium: 138 mmol/L (ref 135–145)

## 2020-07-04 LAB — BODY FLUID CULTURE: Gram Stain: NONE SEEN

## 2020-07-04 NOTE — Progress Notes (Signed)
PROGRESS NOTE    Luis Haney  IZT:[id number] DOB: 11-13-1950 DOA: 06/28/2020 PCP: [name]   Brief Narrative: Taken from H&P Luis Haney is a 69 y.o. male with medical history significant for left lower back pain, history of right rotator cuff tendinitis, COPD, erectile dysfunction, presented to the emergency department for chief concerns of right-sided upper back pain and shortness of breath.  Patient is a current smoker.  There is an increase in his cough.  No fever or chills. CT chest with right lower lobe infiltrate/pneumonia along with a loculated infusion s/p chest tube placement by IR.  Initial labs with exudative fluid, pending culture. Also found to have a spiculated 10 mm right upper lobe nodule which will need further evaluation as an outpatient once recovered from this acute illness. Blood cultures negative so far.  Subjective: Patient thinkns improving. Sob and cough improving. No fevers. Tolerating diet.  Assessment & Plan:   Principal Problem:   Pleural effusion, right Active Problems:   Mass of upper lobe of right lung   Tobacco use disorder, moderate, dependence   Neutrophilic leukocytosis   Loculated pleural effusion   Lobar pneumonia (HCC)   Empyema lung (HCC)  Severe sepsis with pneumonia and empyema.  Initially met severe sepsis criteria with tachypnea, leukocytosis and chest imaging concerning for pneumonia and empyema along with AKI.  Blood cultures negative so far. Chest tube was placed by IR, draining well (out 400 yesterday), initial labs with exudative fluid which is consistent with his pneumonia  Symptomatically improving. Repeat CT on 12/9 shows decreased size of parapneumonic effusion. Cultures growing strep intermedius in both pleural fluid and empyema. This is often found in oral flora and so could represent aspiration event, thus think prudent to maintain on broad spectrum abx. 50 ml out 12/10, 200 yesterday, WBC up trending. Afebrile  and stable on 4 L - s/p vanc/cefepime 12/6, started on unasyn 12/7 - given increased tube output and up-trending white count will repeat CT - continue chest tube, output will need to be below 100 for a few days before we will consider pulling.  Acute hypoxic respiratory failure  Secondary to above process. Now down go 2  L. Not on home O2 - continue to wean as able  AKI.  2/2 dehydration, now resolved - po ad lib, now off IV fluids -Monitor renal function -Avoid nephrotoxins.  COPD.  No acute exacerbation. -Continue with as needed bronchodilators.  Tobacco abuse.  Continue to smoke 1 to 1-1/2 pack/day. -Nicotine patch. -Counseling given.  Spiculated pulmonary nodule.  Will need outpatient evaluation with PET scan as recommended by radiology once acute pneumonia resolves. - Cytology was added to pleural effusion labs but doesn't appear has been added on, will ask nursing to draw.  - pulm consult as above  Insomnia Felt "loopy" on zolpidem, shared decision to d/c  Objective: Vitals:   07/03/20 2200 07/04/20 0502 07/04/20 1025 07/04/20 1100  BP: (!) 107/56 (!) 105/53 (!) 102/54 (!) 106/58  Pulse: 91 79 85 80  Resp: 20 20 16 16   Temp: 99 F (37.2 C) 98.9 F (37.2 C) 98.1 F (36.7 C) 98.4 F (36.9 C)  TempSrc: Oral Oral Oral   SpO2: 97% 97% 97% 97%  Weight:      Height:        Intake/Output Summary (Last 24 hours) at 07/04/2020 1439 Last data filed at 07/04/2020 1300 Gross per 24 hour  Intake 2563.25 ml  Output 480 ml  Net 2083.25  ml   Filed Weights   06/29/20 0412 06/30/20 0252 07/01/20 0359  Weight: 94.2 kg 93.2 kg 93.9 kg    Examination:  General exam: Appears calm and comfortable  Respiratory system: Right chest tube in place with drainage of purulent discharge, decreased breath sounds at right base, scattered rhonchi more prominent on right respiratory effort normal. Cardiovascular system: S1 & S2 heard, RRR.  Gastrointestinal system: Soft, nontender,  nondistended, bowel sounds positive. Central nervous system: Alert and oriented. No focal neurological deficits. Extremities: No edema, no cyanosis, pulses intact and symmetrical. Psychiatry: Judgement and insight appear normal. Mood & affect appropriate.    DVT prophylaxis: lovenox Code Status: Full Family Communication: wife updated telephonically 12/11 Disposition Plan:  Status is: Inpatient  Remains inpatient appropriate because:Inpatient level of care appropriate due to severity of illness   Dispo: The patient is from: Home              Anticipated d/c is to: Home              Anticipated d/c date is: 3 or more days              Patient currently is not medically stable to d/c.  Consultants:   pulmonology  IR  Procedures:  Antimicrobials:  Vancomycin 12/6-12/7 Cefepime 12/6-12/7 Zithromax 12/6-12/7 Unasyn 12/7 -  Data Reviewed: I have personally reviewed following labs and imaging studies  CBC: Recent Labs  Lab 06/28/20 0952 06/29/20 0439 06/30/20 0614 07/01/20 0410 07/02/20 0523 07/03/20 0441 07/04/20 0424  WBC 17.5*   < > 12.4* 13.7* 16.2* 18.5* 19.8*  NEUTROABS 14.8*  --   --   --   --   --   --   HGB 11.5*   < > 11.6* 11.1* 11.2* 10.7* 10.0*  HCT 35.0*   < > 34.6* 33.0* 33.9* 31.4* 30.9*  MCV 93.6   < > 94.0 92.4 93.4 92.1 94.5  PLT 284   < > 293 334 316 332 358   < > = values in this interval not displayed.   Basic Metabolic Panel: Recent Labs  Lab 06/28/20 0952 06/29/20 0439 06/30/20 0614 07/01/20 0410 07/02/20 0523 07/03/20 0441 07/04/20 0424  NA 133*   < > 137 138 136 136 138  K 4.7   < > 4.0 3.9 3.7 3.7 4.2  CL 98   < > 103 104 101 100 101  CO2 23   < > 25 27 27 28 31   GLUCOSE 131*   < > 118* 124* 119* 122* 116*  BUN 55*   < > 43* 33* 25* 22 19  CREATININE 3.44*   < > 1.35* 1.00 0.91 0.98 1.02  CALCIUM 9.0   < > 8.2* 8.0* 7.7* 7.5* 7.5*  MG 2.0  --   --   --   --   --   --    < > = values in this interval not displayed.    GFR: Estimated Creatinine Clearance: 75.1 mL/min (by C-G formula based on SCr of 1.02 mg/dL). Liver Function Tests: Recent Labs  Lab 06/28/20 0952  AST 17  ALT 11  ALKPHOS 46  BILITOT 0.6  PROT 6.4*  ALBUMIN 2.8*   No results for input(s): LIPASE, AMYLASE in the last 168 hours. No results for input(s): AMMONIA in the last 168 hours. Coagulation Profile: Recent Labs  Lab 06/28/20 0952  INR 1.4*   Cardiac Enzymes: No results for input(s): CKTOTAL, CKMB, CKMBINDEX, TROPONINI in the last 168  hours. BNP (last 3 results) No results for input(s): PROBNP in the last 8760 hours. HbA1C: No results for input(s): HGBA1C in the last 72 hours. CBG: No results for input(s): GLUCAP in the last 168 hours. Lipid Profile: No results for input(s): CHOL, HDL, LDLCALC, TRIG, CHOLHDL, LDLDIRECT in the last 72 hours. Thyroid Function Tests: No results for input(s): TSH, T4TOTAL, FREET4, T3FREE, THYROIDAB in the last 72 hours. Anemia Panel: No results for input(s): VITAMINB12, FOLATE, FERRITIN, TIBC, IRON, RETICCTPCT in the last 72 hours. Sepsis Labs: Recent Labs  Lab 06/28/20 4098 06/28/20 1229  LATICACIDVEN 1.2 1.5    Recent Results (from the past 240 hour(s))  Urine culture     Status: None   Collection Time: 06/28/20  9:36 AM   Specimen: In/Out Cath Urine  Result Value Ref Range Status   Specimen Description   Final    IN/OUT CATH URINE Performed at [facility], [address] [place], [place] [address]    Special Requests   Final    NONE Performed at [facility], [address], [place], [place] [address]    Culture   Final    NO GROWTH Performed at [facility], [place] [address] [place], [place] [address]    Report Status 06/30/2020 FINAL  Final  Resp Panel by RT-PCR (Flu A&B, Covid) Nasopharyngeal Swab     Status: None   Collection Time: 06/28/20  9:52 AM   Specimen: Nasopharyngeal Swab; Nasopharyngeal(NP) swabs in vial transport medium   Result Value Ref Range Status   SARS Coronavirus 2 by RT PCR NEGATIVE NEGATIVE Final    Comment: (NOTE) SARS-CoV-2 target nucleic acids are NOT DETECTED.  The SARS-CoV-2 RNA is generally detectable in upper respiratory specimens during the acute phase of infection. The lowest concentration of SARS-CoV-2 viral copies this assay can detect is 138 copies/mL. A negative result does not preclude SARS-Cov-2 infection and should not be used as the sole basis for treatment or other patient management decisions. A negative result may occur with  improper specimen collection/handling, submission of specimen other than nasopharyngeal swab, presence of viral mutation(s) within the areas targeted by this assay, and inadequate number of viral copies(<138 copies/mL). A negative result must be combined with clinical observations, patient history, and epidemiological information. The expected result is Negative.  Fact Sheet for Patients:  EntrepreneurPulse.com.au  Fact Sheet for Healthcare Providers:  IncredibleEmployment.be  This test is no t yet approved or cleared by the [place] FDA and  has been authorized for detection and/or diagnosis of SARS-CoV-2 by FDA under an Emergency Use Authorization (EUA). This EUA will remain  in effect (meaning this test can be used) for the duration of the COVID-19 declaration under Section 564(b)(1) of the Act, 21 U.S.C.section 360bbb-3(b)(1), unless the authorization is terminated  or revoked sooner.       Influenza A by PCR NEGATIVE NEGATIVE Final   Influenza B by PCR NEGATIVE NEGATIVE Final    Comment: (NOTE) The Xpert Xpress SARS-CoV-2/FLU/RSV plus assay is intended as an aid in the diagnosis of influenza from Nasopharyngeal swab specimens and should not be used as a sole basis for treatment. Nasal washings and aspirates are unacceptable for Xpert Xpress SARS-CoV-2/FLU/RSV testing.  Fact Sheet for  Patients: EntrepreneurPulse.com.au  Fact Sheet for Healthcare Providers: IncredibleEmployment.be  This test is not yet approved or cleared by the [place] FDA and has been authorized for detection and/or diagnosis of SARS-CoV-2 by FDA under an Emergency Use Authorization (EUA).  This EUA will remain in effect (meaning this test can be used) for the duration of the COVID-19 declaration under Section 564(b)(1) of the Act, 21 U.S.C. section 360bbb-3(b)(1), unless the authorization is terminated or revoked.  Performed at [facility], [address] [place], [place] [address]   Blood culture (routine x 2)     Status: None   Collection Time: 06/28/20  9:52 AM   Specimen: BLOOD RIGHT HAND  Result Value Ref Range Status   Specimen Description BLOOD RIGHT HAND  Final   Special Requests   Final    BOTTLES DRAWN AEROBIC AND ANAEROBIC Blood Culture adequate volume   Culture   Final    NO GROWTH 5 DAYS Performed at [facility], [address] [place], [place] [address]    Report Status 07/03/2020 FINAL  Final  Blood culture (routine x 2)     Status: None   Collection Time: 06/28/20  9:52 AM   Specimen: BLOOD RIGHT ARM  Result Value Ref Range Status   Specimen Description BLOOD RIGHT ARM  Final   Special Requests   Final    BOTTLES DRAWN AEROBIC AND ANAEROBIC Blood Culture adequate volume   Culture   Final    NO GROWTH 5 DAYS Performed at [facility], [address] [place], [place] [address]    Report Status 07/03/2020 FINAL  Final  Body fluid culture     Status: None   Collection Time: 06/28/20  2:17 PM   Specimen: Pleura; Body Fluid  Result Value Ref Range Status   Specimen Description   Final    PLEURAL Performed at [facility], [address], [place], [place] [address]    Special Requests   Final    NONE Performed at [facility], [address] [place], [place] [address]     Gram Stain NO WBC SEEN NO ORGANISMS SEEN   Final   Culture   Final    RARE STREPTOCOCCUS INTERMEDIUS CRITICAL RESULT CALLED TO, READ BACK BY AND VERIFIED WITH: RN A.LEE AT 6384 ON 07/01/2020 BY T.SAAD Performed at [facility], [place] [address], [place], [place] [address]    Report Status 07/04/2020 FINAL  Final   Organism ID, Bacteria STREPTOCOCCUS INTERMEDIUS  Final      Susceptibility   Streptococcus intermedius - MIC*    PENICILLIN <=0.06 SENSITIVE Sensitive     CEFTRIAXONE 0.25 SENSITIVE Sensitive     LEVOFLOXACIN 0.5 SENSITIVE Sensitive     VANCOMYCIN 0.5 SENSITIVE Sensitive     * RARE STREPTOCOCCUS INTERMEDIUS  Aerobic/Anaerobic Culture (surgical/deep wound)     Status: None   Collection Time: 06/28/20  2:17 PM   Specimen: Abscess  Result Value Ref Range Status   Specimen Description   Final    ABSCESS Performed at [facility], [address], [place], [place] [address]    Special Requests   Final    EMPYEMA Performed at [facility], [address] [place], [place] [address]    Gram Stain   Final    ABUNDANT WBC PRESENT,BOTH PMN AND MONONUCLEAR NO ORGANISMS SEEN    Culture   Final    RARE STREPTOCOCCUS INTERMEDIUS NO ANAEROBES ISOLATED Performed at [facility], [place] [address], [place], [place] [address]    Report Status 07/03/2020 FINAL  Final   Organism ID, Bacteria STREPTOCOCCUS INTERMEDIUS  Final      Susceptibility   Streptococcus intermedius - MIC*  PENICILLIN <=0.06 SENSITIVE Sensitive     CEFTRIAXONE <=0.12 SENSITIVE Sensitive     ERYTHROMYCIN <=0.12 SENSITIVE Sensitive     LEVOFLOXACIN 0.5 SENSITIVE Sensitive     VANCOMYCIN 0.5 SENSITIVE Sensitive     * RARE STREPTOCOCCUS INTERMEDIUS  MRSA PCR Screening     Status: None   Collection Time: 06/29/20 11:55 AM   Specimen: Nasal Mucosa; Nasopharyngeal  Result Value Ref Range Status   MRSA by PCR NEGATIVE NEGATIVE Final    Comment:        The GeneXpert MRSA Assay  (FDA approved for NASAL specimens only), is one component of a comprehensive MRSA colonization surveillance program. It is not intended to diagnose MRSA infection nor to guide or monitor treatment for MRSA infections. Performed at [facility], [address] [place], [place] [address]      Radiology Studies: No results found.  Scheduled Meds: . enoxaparin (LOVENOX) injection  40 mg Subcutaneous Q24H  . mouth rinse  15 mL Mouth Rinse BID  . multivitamin with minerals  1 tablet Oral Daily  . nicotine  14 mg Transdermal Daily   Continuous Infusions: . sodium chloride 10 mL/hr at 07/03/20 2032  . ampicillin-sulbactam (UNASYN) IV 3 g (07/04/20 1139)     LOS: 5 days   Time spent: 25 minutes.  Desma Maxim, MD [facility]  If 7PM-7AM, please contact night-coverage [email or web address]  07/04/2020, 2:39 PM

## 2020-07-05 ENCOUNTER — Encounter: Payer: Self-pay | Admitting: Internal Medicine

## 2020-07-05 LAB — BASIC METABOLIC PANEL
Anion gap: 7 (ref 5–15)
BUN: 18 mg/dL (ref 8–23)
CO2: 30 mmol/L (ref 22–32)
Calcium: 7.3 mg/dL — ABNORMAL LOW (ref 8.9–10.3)
Chloride: 100 mmol/L (ref 98–111)
Creatinine, Ser: 1 mg/dL (ref 0.61–1.24)
GFR, Estimated: >60 mL/min (ref >60)
Glucose, Bld: 110 mg/dL — ABNORMAL HIGH (ref 70–99)
Potassium: 3.8 mmol/L (ref 3.5–5.1)
Sodium: 137 mmol/L (ref 135–145)

## 2020-07-05 LAB — CBC
HCT: 29.2 % — ABNORMAL LOW (ref 39.0–52.0)
Hemoglobin: 9.7 g/dL — ABNORMAL LOW (ref 13.0–17.0)
MCH: 30.6 pg (ref 26.0–34.0)
MCHC: 33.2 g/dL (ref 30.0–36.0)
MCV: 92.1 fL (ref 80.0–100.0)
Platelets: 423 10*3/uL — ABNORMAL HIGH (ref 150–400)
RBC: 3.17 MIL/uL — ABNORMAL LOW (ref 4.22–5.81)
RDW: 14.3 % (ref 11.5–15.5)
WBC: 20.9 10*3/uL — ABNORMAL HIGH (ref 4.0–10.5)
nRBC: 0 % (ref 0.0–0.2)

## 2020-07-05 MED ORDER — GUAIFENESIN ER 600 MG PO TB12
1200.0000 mg | ORAL_TABLET | Freq: Two times a day (BID) | ORAL | Status: DC
  Administered 2020-07-05 – 2020-07-09 (×9): 1200 mg via ORAL
  Filled 2020-07-05 (×9): qty 2

## 2020-07-05 NOTE — Progress Notes (Signed)
Referring Physician(s): Charlsie Quest   Supervising Physician: Markus Daft  Patient Status:  [facility] - In-pt  Chief Complaint:  Right sided pleural effusion consistent with empyema a s/p right sided drain (pigtail) placed on 12.6.21 by Dr. Shellia Cleverly   Subjective:  Patient reporting improved dyspnea Currently tolerating room air with a mucus sounding cough but unable to cough anything up.   Allergies: Patient has no known allergies.  Medications: Prior to Admission medications   Medication Sig Start Date End Date Taking? Authorizing Provider  calcium carbonate (OS-CAL - DOSED IN MG OF ELEMENTAL CALCIUM) 1250 (500 Ca) MG tablet Take 1 tablet by mouth.   Yes [provider]  Multiple Vitamin (MULTI VITAMIN MENS PO) Take by mouth.   Yes [provider]  Potassium 99 MG TABS Take 99 mg by mouth daily.   Yes [provider]     Vital Signs: BP (!) 99/52   Pulse 86   Temp 98.4 F (36.9 C) (Oral)   Resp 20   Ht 6\' 1"  (1.854 m)   Wt 207 lb 0.2 oz (93.9 kg)   SpO2 93%   BMI 27.31 kg/m   Physical Exam Vitals and nursing note reviewed.  Constitutional:      Appearance: He is well-developed and well-nourished.  HENT:     Head: Normocephalic.  Pulmonary:     Effort: Pulmonary effort is normal. No accessory muscle usage or respiratory distress.  Musculoskeletal:        General: Normal range of motion.     Cervical back: Normal range of motion.  Skin:    General: Skin is dry.  Neurological:     Mental Status: He is alert and oriented to person, place, and time.  Psychiatric:        Mood and Affect: Mood and affect normal.     Imaging: CT CHEST WO CONTRAST  Result Date: 07/04/2020 CLINICAL DATA:  Chest tube placement 06/28/2020, empyema, persistent cough EXAM: CT CHEST WITHOUT CONTRAST TECHNIQUE: Multidetector CT imaging of the chest was performed following the standard protocol without IV contrast. COMPARISON:  CT 07/01/2020 FINDINGS:  Cardiovascular: Limited assessment in the absence of contrast media. Cardiac size within normal limits. Three-vessel coronary artery atherosclerosis is present. Trace pericardial effusion. Atherosclerotic plaque within the normal caliber aorta. No periaortic stranding or hemorrhage. No hyperdense mural thickening or plaque displacement. Shared origin of the brachiocephalic and left common carotid arteries. Minimal plaque in the proximal great vessels. Central pulmonary arteries are normal caliber. No major venous abnormalities. Mediastinum/Nodes: Persistent scattered subcentimeter low-attenuation mediastinal and hilar nodes, the latter of which are difficult to fully assess in the absence of contrast media. No new enlarged or enlarging nodes. No axillary adenopathy. Scattered secretions again seen in the trachea. Some mild central airways thickening is noted as well. No acute abnormality of the esophagus. Thyroid gland and thoracic inlet are unremarkable. Lungs/Pleura: A right pigtail pleural drain is in place, entering along the anterior axillary line at the third interspace, terminating within a heterogeneous, thick-walled hydropneumothorax. Loculated components are again seen extending into the minor fissure and to a lesser extent the major fissure as well. Redemonstration of a spiculated nodule towards the right lung apex measuring up to 14 mm in size (3/26). An additional cavitary nodule is seen in the medial right lower lobe (3/73) measuring approximately 1.8 x 1.4 cm, increased from 1.6 x 0.0 cm on prior. Background of emphysematous changes. Small bland left pleural effusion. No left pneumothorax. Upper  Abdomen: Stable mild symmetric bilateral perinephric stranding, a nonspecific finding which may correlate with advanced age or decreased renal function. No acute abnormalities present in the visualized portions of the upper abdomen. Musculoskeletal: Exaggerated thoracic kyphosis and mild levocurvature of the  thoracic spine. Cervical fusion, incompletely assessed. No acute osseous abnormality or suspicious osseous lesion. No suspicious chest wall masses or lesions. IMPRESSION: 1. Right pigtail pleural drain in place, terminating within a complex, thick-walled hydropneumothorax. Loculated components are again seen extending into the minor fissure and to a lesser extent the major fissure as well. Overall size and extent of this collection is not significantly changed from the comparison. 2. Scattered secretions seen within the airways. 3. Slight interval increase in size of the cavitary nodule with layering air-fluid level in the medial right lower lobe. Could reflect intrapulmonary abscess or cavitary nodule. 4. Stable spiculated nodule towards the right lung apex measuring up to 14 mm in size. 5. Small left pleural effusion and atelectasis. 6. Aortic Atherosclerosis (ICD10-I70.0) 7. Emphysema (ICD10-J43.9). Electronically Signed   By: Lovena Le M.D.   On: 07/04/2020 19:51   CT CHEST WO CONTRAST  Result Date: 07/01/2020 CLINICAL DATA:  Empyema EXAM: CT CHEST WITHOUT CONTRAST TECHNIQUE: Multidetector CT imaging of the chest was performed following the standard protocol without IV contrast. COMPARISON:  CT dated June 28, 2020 FINDINGS: Cardiovascular: The heart size is stable. Aortic calcifications are noted without evidence for an aneurysm. There are coronary artery calcifications. There is a trace pericardial effusion. Mediastinum/Nodes: --mildly enlarged mediastinal lymph nodes are noted --there are enlarged right hilar lymph nodes -- No axillary lymphadenopathy. -- No supraclavicular lymphadenopathy. -- Normal thyroid gland where visualized. -  Unremarkable esophagus. Lungs/Pleura: The previously demonstrated right-sided loculated pleural effusion has significantly improved status post right-sided chest tube placement. There remains a moderate-sized loculated right-sided hydropneumothorax. There is a  spiculated pulmonary nodule at the right lung apex measuring approximately 1.4 cm (axial series 3, image 32). Emphysematous changes are noted. there is some nodularity along the fissures on the right which is felt to represent trapped fluid as opposed to true pulmonary nodules. There is debris within the trachea. The left lung field is mostly clear aside from a small left-sided pleural effusion with adjacent atelectasis. Upper Abdomen: No acute abnormality identified in the upper abdomen. Musculoskeletal: No chest wall abnormality. No bony spinal canal stenosis. IMPRESSION: 1. Significantly improved appearance of the previously demonstrated right-sided loculated pleural effusion status post right-sided chest tube placement. There remains a moderate-sized loculated right-sided hydropneumothorax. 2. Spiculated 1.4 cm pulmonary nodule at the right lung apex concerning for bronchogenic carcinoma. Pulmonary medicine follow-up is recommended. 3. Enlarged mediastinal and hilar lymph nodes. While these may be reactive, in the setting of a spiculated right upper lobe pulmonary nodule, nodal metastatic disease is not excluded. 4. Small left-sided pleural effusion with adjacent atelectasis. Aortic Atherosclerosis (ICD10-I70.0) and Emphysema (ICD10-J43.9). Electronically Signed   By: Constance Holster M.D.   On: 07/01/2020 19:39    Labs:  CBC: Recent Labs    07/02/20 0523 07/03/20 0441 07/04/20 0424 07/05/20 0439  WBC 16.2* 18.5* 19.8* 20.9*  HGB 11.2* 10.7* 10.0* 9.7*  HCT 33.9* 31.4* 30.9* 29.2*  PLT 316 332 358 423*    COAGS: Recent Labs    06/28/20 0952  INR 1.4*  APTT 41*    BMP: Recent Labs    07/02/20 0523 07/03/20 0441 07/04/20 0424 07/05/20 0439  NA 136 136 138 137  K 3.7 3.7 4.2 3.8  CL  101 100 101 100  CO2 27 28 31 30   GLUCOSE 119* 122* 116* 110*  BUN 25* 22 19 18   CALCIUM 7.7* 7.5* 7.5* 7.3*  CREATININE 0.91 0.98 1.02 1.00  GFRNONAA >60 >60 >60 >60    LIVER FUNCTION  TESTS: Recent Labs    06/28/20 0952  BILITOT 0.6  AST 17  ALT 11  ALKPHOS 46  PROT 6.4*  ALBUMIN 2.8*    Assessment and Plan:  69 y.o. male inpatient. Smoker history of COPD, AKI. Presented to the ED at [facility] with back pain X 2 weeks with [facility] X 2 days found to have a right sided empyema.  IR placed a pigtail catheter to the right sided pleural effusion on 12.6.21 CT Chest from 12.12.21 reads Right pigtail pleural drain in place, terminating within a complex, thick-walled hydropneumothorax. Loculated components are again seen extending into the minor fissure and to a lesser extent the major fissure as well. Overall size and extent of this collection is not significantly changed from the comparison. Per EPIC output is 70 ml, 200 ml, 50 ml. WBC is 20.9. Cultures from aspirate shows rare streptococcus intermedius with glucose of 20, LDH of 1970 and protein on 4.8. Patient is afebrile. Patient is being followed by pulmonology.    Recommend continuing current treatment plan. Monitor with chest xray until output is minimal. Once output is minimal would consider CT Chest with IV contrast for further evaluation (if creatinine allows). IR will continue to follow along - plans per primary Team and pulmonology.   Electronically Signed: Jacqualine Mau, NP 07/05/2020, 12:26 PM   I spent a total of 15 Minutes at the patient's bedside AND on the patient's hospital floor or unit, greater than 50% of which was counseling/coordinating care for right sided chest drain.

## 2020-07-05 NOTE — Care Management Important Message (Signed)
Important Message  Patient Details  Name: Luis Haney MRN: [id number] Date of Birth: [birth year]   Medicare Important Message Given:  Yes     Dannette Barbara 07/05/2020, 11:51 AM

## 2020-07-05 NOTE — Progress Notes (Signed)
PROGRESS NOTE    Luis Haney  QAS:[id number] DOB: February 22, 1951 DOA: 06/28/2020 PCP: [name]   Brief Narrative: Taken from H&P Luis Haney is a 69 y.o. male with medical history significant for left lower back pain, history of right rotator cuff tendinitis, COPD, erectile dysfunction, presented to the emergency department for chief concerns of right-sided upper back pain and shortness of breath.  Patient is a current smoker.  There is an increase in his cough.  No fever or chills. CT chest with right lower lobe infiltrate/pneumonia along with a loculated infusion s/p chest tube placement by IR.  Initial labs with exudative fluid, pending culture. Also found to have a spiculated 10 mm right upper lobe nodule which will need further evaluation as an outpatient once recovered from this acute illness. Blood cultures negative so far.  Subjective: Patient thinkns improving. Sob and cough improving. No fevers. Tolerating diet.  Assessment & Plan:   Principal Problem:   Pleural effusion, right Active Problems:   Mass of upper lobe of right lung   Tobacco use disorder, moderate, dependence   Neutrophilic leukocytosis   Loculated pleural effusion   Lobar pneumonia (HCC)   Empyema lung (HCC)  Severe sepsis with pneumonia and empyema.  Initially met severe sepsis criteria with tachypnea, leukocytosis and chest imaging concerning for pneumonia and empyema along with AKI.  Blood cultures negative so far. Chest tube was placed by IR, initial labs with exudative fluid which is consistent with his pneumonia  Symptomatically improving. Repeat CT on 12/9 shows decreased size of parapneumonic effusion, and repeat on 12/12 showed stability in complex loculated thick walled hydropneumothorax. Cultures growing strep intermedius in both pleural fluid and empyema. This is often found in oral flora and so could represent aspiration event, thus think prudent to maintain on broad spectrum abx. 50  ml out 12/10, 200 12/11, 70 out 12/12., WBC up trending. Afebrile. Weaned off O2 yesterday. - s/p vanc/cefepime 12/6, started on unasyn 12/7, continue - will touch base w/ pulm about ongoing plan - continue chest tube, output will need to be below 100 for a few days before we will consider pulling. Would perform CT w/ contrast prior to that  Acute hypoxic respiratory failure  Secondary to above process. Now resolved off o2 12/12 - monitor  AKI.  2/2 dehydration, now resolved - po ad lib, now off IV fluids -Monitor renal function -Avoid nephrotoxins.  COPD.  No acute exacerbation. -Continue with as needed bronchodilators.  Tobacco abuse.  Continue to smoke 1 to 1-1/2 pack/day. -Nicotine patch. -Counseling given.  Spiculated pulmonary nodule.  Will need outpatient evaluation with PET scan as recommended by radiology once acute pneumonia resolves. - Cytology was added to pleural effusion labs but doesn't appear has been added on, will ask nursing to draw.  - pulm consult as above  Insomnia Felt "loopy" on zolpidem, shared decision to d/c  Objective: Vitals:   07/04/20 2341 07/05/20 0525 07/05/20 0851 07/05/20 1139  BP: (!) 133/58 (!) 101/45 122/63 (!) 99/52  Pulse: 92 88 95 86  Resp: 20 19 (!) 22 20  Temp: 99.6 F (37.6 C) 99 F (37.2 C) 99.3 F (37.4 C) 98.4 F (36.9 C)  TempSrc: Oral  Oral Oral  SpO2: 93% 90% 93% 93%  Weight:      Height:        Intake/Output Summary (Last 24 hours) at 07/05/2020 1420 Last data filed at 07/05/2020 1226 Gross per 24 hour  Intake 887.74 ml  Output 365 ml  Net 522.74 ml   Filed Weights   06/29/20 0412 06/30/20 0252 07/01/20 0359  Weight: 94.2 kg 93.2 kg 93.9 kg    Examination:  General exam: Appears calm and comfortable  Respiratory system: Right chest tube in place with drainage of purulent discharge, decreased breath sounds at right base, mild scattered rhonchi on right upper lung, left is clear Cardiovascular system: S1 &  S2 heard, RRR.  Gastrointestinal system: Soft, nontender, nondistended, bowel sounds positive. Central nervous system: Alert and oriented. No focal neurological deficits. Extremities: No edema, no cyanosis, pulses intact and symmetrical. Psychiatry: Judgement and insight appear normal. Mood & affect appropriate.    DVT prophylaxis: lovenox Code Status: Full Family Communication: wife updated telephonically 12/11 Disposition Plan:  Status is: Inpatient  Remains inpatient appropriate because:Inpatient level of care appropriate due to severity of illness   Dispo: The patient is from: Home              Anticipated d/c is to: Home              Anticipated d/c date is: 3 or more days              Patient currently is not medically stable to d/c.  Consultants:   pulmonology  IR  Procedures:  Antimicrobials:  Vancomycin 12/6-12/7 Cefepime 12/6-12/7 Zithromax 12/6-12/7 Unasyn 12/7 -  Data Reviewed: I have personally reviewed following labs and imaging studies  CBC: Recent Labs  Lab 07/01/20 0410 07/02/20 0523 07/03/20 0441 07/04/20 0424 07/05/20 0439  WBC 13.7* 16.2* 18.5* 19.8* 20.9*  HGB 11.1* 11.2* 10.7* 10.0* 9.7*  HCT 33.0* 33.9* 31.4* 30.9* 29.2*  MCV 92.4 93.4 92.1 94.5 92.1  PLT 334 316 332 358 761*   Basic Metabolic Panel: Recent Labs  Lab 07/01/20 0410 07/02/20 0523 07/03/20 0441 07/04/20 0424 07/05/20 0439  NA 138 136 136 138 137  K 3.9 3.7 3.7 4.2 3.8  CL 104 101 100 101 100  CO2 _0 GLUCOSE 124* 119* 122* 116* 110*  BUN 33* 25* _1 CREATININE 1.00 0.91 0.98 1.02 1.00  CALCIUM 8.0* 7.7* 7.5* 7.5* 7.3*   GFR: Estimated Creatinine Clearance: 76.6 mL/min (by C-G formula based on SCr of 1 mg/dL). Liver Function Tests: No results for input(s): AST, ALT, ALKPHOS, BILITOT, PROT, ALBUMIN in the last 168 hours. No results for input(s): LIPASE, AMYLASE in the last 168 hours. No results for input(s): AMMONIA in the last 168  hours. Coagulation Profile: No results for input(s): INR, PROTIME in the last 168 hours. Cardiac Enzymes: No results for input(s): CKTOTAL, CKMB, CKMBINDEX, TROPONINI in the last 168 hours. BNP (last 3 results) No results for input(s): PROBNP in the last 8760 hours. HbA1C: No results for input(s): HGBA1C in the last 72 hours. CBG: No results for input(s): GLUCAP in the last 168 hours. Lipid Profile: No results for input(s): CHOL, HDL, LDLCALC, TRIG, CHOLHDL, LDLDIRECT in the last 72 hours. Thyroid Function Tests: No results for input(s): TSH, T4TOTAL, FREET4, T3FREE, THYROIDAB in the last 72 hours. Anemia Panel: No results for input(s): VITAMINB12, FOLATE, FERRITIN, TIBC, IRON, RETICCTPCT in the last 72 hours. Sepsis Labs: No results for input(s): PROCALCITON, LATICACIDVEN in the last 168 hours.  Recent Results (from the past 240 hour(s))  Urine culture     Status: None   Collection Time: 06/28/20  9:36 AM   Specimen: In/Out Cath Urine  Result Value Ref Range Status   Specimen  Description   Final    IN/OUT CATH URINE Performed at [facility], [address], [place], [place] [address]    Special Requests   Final    NONE Performed at [facility], [address] [place], [place] [address]    Culture   Final    NO GROWTH Performed at [facility], [place] [address] [place], [place] [address]    Report Status 06/30/2020 FINAL  Final  Resp Panel by RT-PCR (Flu A&B, Covid) Nasopharyngeal Swab     Status: None   Collection Time: 06/28/20  9:52 AM   Specimen: Nasopharyngeal Swab; Nasopharyngeal(NP) swabs in vial transport medium  Result Value Ref Range Status   SARS Coronavirus 2 by RT PCR NEGATIVE NEGATIVE Final    Comment: (NOTE) SARS-CoV-2 target nucleic acids are NOT DETECTED.  The SARS-CoV-2 RNA is generally detectable in upper respiratory specimens during the acute phase of infection. The lowest concentration of SARS-CoV-2 viral copies this  assay can detect is 138 copies/mL. A negative result does not preclude SARS-Cov-2 infection and should not be used as the sole basis for treatment or other patient management decisions. A negative result may occur with  improper specimen collection/handling, submission of specimen other than nasopharyngeal swab, presence of viral mutation(s) within the areas targeted by this assay, and inadequate number of viral copies(<138 copies/mL). A negative result must be combined with clinical observations, patient history, and epidemiological information. The expected result is Negative.  Fact Sheet for Patients:  EntrepreneurPulse.com.au  Fact Sheet for Healthcare Providers:  IncredibleEmployment.be  This test is no t yet approved or cleared by the [place] FDA and  has been authorized for detection and/or diagnosis of SARS-CoV-2 by FDA under an Emergency Use Authorization (EUA). This EUA will remain  in effect (meaning this test can be used) for the duration of the COVID-19 declaration under Section 564(b)(1) of the Act, 21 U.S.C.section 360bbb-3(b)(1), unless the authorization is terminated  or revoked sooner.       Influenza A by PCR NEGATIVE NEGATIVE Final   Influenza B by PCR NEGATIVE NEGATIVE Final    Comment: (NOTE) The Xpert Xpress SARS-CoV-2/FLU/RSV plus assay is intended as an aid in the diagnosis of influenza from Nasopharyngeal swab specimens and should not be used as a sole basis for treatment. Nasal washings and aspirates are unacceptable for Xpert Xpress SARS-CoV-2/FLU/RSV testing.  Fact Sheet for Patients: EntrepreneurPulse.com.au  Fact Sheet for Healthcare Providers: IncredibleEmployment.be  This test is not yet approved or cleared by the [place] FDA and has been authorized for detection and/or diagnosis of SARS-CoV-2 by FDA under an Emergency Use Authorization (EUA). This EUA will  remain in effect (meaning this test can be used) for the duration of the COVID-19 declaration under Section 564(b)(1) of the Act, 21 U.S.C. section 360bbb-3(b)(1), unless the authorization is terminated or revoked.  Performed at [facility], [address] [place], [place] [address]   Blood culture (routine x 2)     Status: None   Collection Time: 06/28/20  9:52 AM   Specimen: BLOOD RIGHT HAND  Result Value Ref Range Status   Specimen Description BLOOD RIGHT HAND  Final   Special Requests   Final    BOTTLES DRAWN AEROBIC AND ANAEROBIC Blood Culture adequate volume   Culture   Final    NO GROWTH 5 DAYS Performed at [facility], [address] [place], [place] [address]    Report Status 07/03/2020 FINAL  Final  Blood culture (routine x 2)     Status: None   Collection Time: 06/28/20  9:52 AM   Specimen: BLOOD RIGHT ARM  Result Value Ref Range Status   Specimen Description BLOOD RIGHT ARM  Final   Special Requests   Final    BOTTLES DRAWN AEROBIC AND ANAEROBIC Blood Culture adequate volume   Culture   Final    NO GROWTH 5 DAYS Performed at [facility], [address] [place], [place] [address]    Report Status 07/03/2020 FINAL  Final  Body fluid culture     Status: None   Collection Time: 06/28/20  2:17 PM   Specimen: Pleura; Body Fluid  Result Value Ref Range Status   Specimen Description   Final    PLEURAL Performed at [facility], [address] [place], [place] [address]    Special Requests   Final    NONE Performed at [facility], [address] [place], [place] [address]    Gram Stain NO WBC SEEN NO ORGANISMS SEEN   Final   Culture   Final    RARE STREPTOCOCCUS INTERMEDIUS CRITICAL RESULT CALLED TO, READ BACK BY AND VERIFIED WITH: RN A.LEE AT 6256 ON 07/01/2020 BY T.SAAD Performed at [facility], [place] [address] [place], [place] [address]    Report Status 07/04/2020 FINAL  Final   Organism ID,  Bacteria STREPTOCOCCUS INTERMEDIUS  Final      Susceptibility   Streptococcus intermedius - MIC*    PENICILLIN <=0.06 SENSITIVE Sensitive     CEFTRIAXONE 0.25 SENSITIVE Sensitive     LEVOFLOXACIN 0.5 SENSITIVE Sensitive     VANCOMYCIN 0.5 SENSITIVE Sensitive     * RARE STREPTOCOCCUS INTERMEDIUS  Aerobic/Anaerobic Culture (surgical/deep wound)     Status: None   Collection Time: 06/28/20  2:17 PM   Specimen: Abscess  Result Value Ref Range Status   Specimen Description   Final    ABSCESS Performed at [facility], [address] [place], [place] [address]    Special Requests   Final    EMPYEMA Performed at [facility], [address] [place], [place] [address]    Gram Stain   Final    ABUNDANT WBC PRESENT,BOTH PMN AND MONONUCLEAR NO ORGANISMS SEEN    Culture   Final    RARE STREPTOCOCCUS INTERMEDIUS NO ANAEROBES ISOLATED Performed at [facility], [place] [address] [place], [place] [address]    Report Status 07/03/2020 FINAL  Final   Organism ID, Bacteria STREPTOCOCCUS INTERMEDIUS  Final      Susceptibility   Streptococcus intermedius - MIC*    PENICILLIN <=0.06 SENSITIVE Sensitive     CEFTRIAXONE <=0.12 SENSITIVE Sensitive     ERYTHROMYCIN <=0.12 SENSITIVE Sensitive     LEVOFLOXACIN 0.5 SENSITIVE Sensitive     VANCOMYCIN 0.5 SENSITIVE Sensitive     * RARE STREPTOCOCCUS INTERMEDIUS  MRSA PCR Screening     Status: None   Collection Time: 06/29/20 11:55 AM   Specimen: Nasal Mucosa; Nasopharyngeal  Result Value Ref Range Status   MRSA by PCR NEGATIVE NEGATIVE Final    Comment:        The GeneXpert MRSA Assay (FDA approved for NASAL specimens only), is one component of a comprehensive MRSA colonization surveillance program. It is not intended to diagnose MRSA infection nor to guide or monitor treatment for MRSA infections. Performed at [facility], [address] [place],  [address]  Radiology Studies: CT CHEST WO  CONTRAST  Result Date: 07/04/2020 CLINICAL DATA:  Chest tube placement 06/28/2020, empyema, persistent cough EXAM: CT CHEST WITHOUT CONTRAST TECHNIQUE: Multidetector CT imaging of the chest was performed following the standard protocol without IV contrast. COMPARISON:  CT 07/01/2020 FINDINGS: Cardiovascular: Limited assessment in the absence of contrast media. Cardiac size within normal limits. Three-vessel coronary artery atherosclerosis is present. Trace pericardial effusion. Atherosclerotic plaque within the normal caliber aorta. No periaortic stranding or hemorrhage. No hyperdense mural thickening or plaque displacement. Shared origin of the brachiocephalic and left common carotid arteries. Minimal plaque in the proximal great vessels. Central pulmonary arteries are normal caliber. No major venous abnormalities. Mediastinum/Nodes: Persistent scattered subcentimeter low-attenuation mediastinal and hilar nodes, the latter of which are difficult to fully assess in the absence of contrast media. No new enlarged or enlarging nodes. No axillary adenopathy. Scattered secretions again seen in the trachea. Some mild central airways thickening is noted as well. No acute abnormality of the esophagus. Thyroid gland and thoracic inlet are unremarkable. Lungs/Pleura: A right pigtail pleural drain is in place, entering along the anterior axillary line at the third interspace, terminating within a heterogeneous, thick-walled hydropneumothorax. Loculated components are again seen extending into the minor fissure and to a lesser extent the major fissure as well. Redemonstration of a spiculated nodule towards the right lung apex measuring up to 14 mm in size (3/26). An additional cavitary nodule is seen in the medial right lower lobe (3/73) measuring approximately 1.8 x 1.4 cm, increased from 1.6 x 0.0 cm on prior. Background of emphysematous changes. Small bland left pleural effusion. No left pneumothorax. Upper Abdomen:  Stable mild symmetric bilateral perinephric stranding, a nonspecific finding which may correlate with advanced age or decreased renal function. No acute abnormalities present in the visualized portions of the upper abdomen. Musculoskeletal: Exaggerated thoracic kyphosis and mild levocurvature of the thoracic spine. Cervical fusion, incompletely assessed. No acute osseous abnormality or suspicious osseous lesion. No suspicious chest wall masses or lesions. IMPRESSION: 1. Right pigtail pleural drain in place, terminating within a complex, thick-walled hydropneumothorax. Loculated components are again seen extending into the minor fissure and to a lesser extent the major fissure as well. Overall size and extent of this collection is not significantly changed from the comparison. 2. Scattered secretions seen within the airways. 3. Slight interval increase in size of the cavitary nodule with layering air-fluid level in the medial right lower lobe. Could reflect intrapulmonary abscess or cavitary nodule. 4. Stable spiculated nodule towards the right lung apex measuring up to 14 mm in size. 5. Small left pleural effusion and atelectasis. 6. Aortic Atherosclerosis (ICD10-I70.0) 7. Emphysema (ICD10-J43.9). Electronically Signed   By: Lovena Le M.D.   On: 07/04/2020 19:51    Scheduled Meds:  enoxaparin (LOVENOX) injection  40 mg Subcutaneous Q24H   mouth rinse  15 mL Mouth Rinse BID   multivitamin with minerals  1 tablet Oral Daily   nicotine  14 mg Transdermal Daily   Continuous Infusions:  sodium chloride 10 mL/hr at 07/05/20 1226   ampicillin-sulbactam (UNASYN) IV Stopped (07/05/20 1206)     LOS: 6 days   Time spent: 25 minutes.  Desma Maxim, MD [facility]  If 7PM-7AM, please contact night-coverage [email or web address]  07/05/2020, 2:20 PM

## 2020-07-06 LAB — BASIC METABOLIC PANEL
Anion gap: 10 (ref 5–15)
BUN: 16 mg/dL (ref 8–23)
CO2: 26 mmol/L (ref 22–32)
Calcium: 7.4 mg/dL — ABNORMAL LOW (ref 8.9–10.3)
Chloride: 100 mmol/L (ref 98–111)
Creatinine, Ser: 0.92 mg/dL (ref 0.61–1.24)
GFR, Estimated: >60 mL/min (ref >60)
Glucose, Bld: 109 mg/dL — ABNORMAL HIGH (ref 70–99)
Potassium: 3.7 mmol/L (ref 3.5–5.1)
Sodium: 136 mmol/L (ref 135–145)

## 2020-07-06 LAB — CBC
HCT: 30.1 % — ABNORMAL LOW (ref 39.0–52.0)
Hemoglobin: 10.2 g/dL — ABNORMAL LOW (ref 13.0–17.0)
MCH: 31.1 pg (ref 26.0–34.0)
MCHC: 33.9 g/dL (ref 30.0–36.0)
MCV: 91.8 fL (ref 80.0–100.0)
Platelets: 431 10*3/uL — ABNORMAL HIGH (ref 150–400)
RBC: 3.28 MIL/uL — ABNORMAL LOW (ref 4.22–5.81)
RDW: 14.3 % (ref 11.5–15.5)
WBC: 20.1 10*3/uL — ABNORMAL HIGH (ref 4.0–10.5)
nRBC: 0 % (ref 0.0–0.2)

## 2020-07-06 LAB — CYTOLOGY - NON PAP

## 2020-07-06 NOTE — Progress Notes (Signed)
PROGRESS NOTE    Luis Haney  HLK:[id number] DOB: Jun 28, 1951 DOA: 06/28/2020 PCP: [name]   Brief Narrative: Taken from H&P Luis Haney is a 69 y.o. male with medical history significant for left lower back pain, history of right rotator cuff tendinitis, COPD, erectile dysfunction, presented to the emergency department for chief concerns of right-sided upper back pain and shortness of breath.  Patient is a current smoker.  There is an increase in his cough.  No fever or chills. CT chest with right lower lobe infiltrate/pneumonia along with a loculated infusion s/p chest tube placement by IR.  Initial labs with exudative fluid, pending culture. Also found to have a spiculated 10 mm right upper lobe nodule which will need further evaluation as an outpatient once recovered from this acute illness. Blood cultures negative so far.  Subjective: Patient thinkns improving. Sob and cough improving. No fevers. Tolerating diet.  Assessment & Plan:   Principal Problem:   Pleural effusion, right Active Problems:   Mass of upper lobe of right lung   Tobacco use disorder, moderate, dependence   Neutrophilic leukocytosis   Loculated pleural effusion   Lobar pneumonia (HCC)   Empyema lung (HCC)  Severe sepsis with pneumonia and empyema.  Initially met severe sepsis criteria with tachypnea, leukocytosis and chest imaging concerning for pneumonia and empyema along with AKI.  Blood cultures negative. Chest tube was placed by IR, initial labs with exudative fluid which is consistent with his pneumonia  Symptomatically improving. Repeat CT on 12/9 shows decreased size of parapneumonic effusion, and repeat on 12/12 showed stability in complex loculated thick walled hydropneumothorax. Cultures growing strep intermedius in both pleural fluid and empyema. This is often found in oral flora and so could represent aspiration event, thus think prudent to maintain on broad spectrum abx. 50 ml out  12/10, 200 12/11, 70 out 12/12, 80 out 12/13. WBC elevated and stable, afebrile. Weaned off O2 12/12.. - s/p vanc/cefepime 12/6, started on unasyn 12/7, continue - per IM with Dr. Humphrey Rolls, if by tomorrow chest tube output remains low (less than 100), will be ok to d/c chest tube and d/c home with oral abx without repeating a CT of the chest, with plan for f/u with Dr. Humphrey Rolls  Acute hypoxic respiratory failure  Secondary to above process. Now resolved off o2 12/12 - monitor  AKI.  2/2 dehydration, now resolved - po ad lib, now off IV fluids -Monitor renal function -Avoid nephrotoxins.  COPD.  No acute exacerbation. -Continue with as needed bronchodilators.  Tobacco abuse.  Continue to smoke 1 to 1-1/2 pack/day. -Nicotine patch. -Counseling given.  Spiculated pulmonary nodule.  Will need outpatient evaluation with PET scan as recommended by radiology once acute pneumonia resolves. - Cytology was added to pleural effusion labs but doesn't appear has been added on, will ask nursing to draw.  - pulm consult as above   Objective: Vitals:   07/05/20 2354 07/06/20 0429 07/06/20 0814 07/06/20 1119  BP: (!) 112/56 (!) 107/52 106/63 (!) 100/56  Pulse: 85 86 83 81  Resp: _0 Temp: 98.1 F (36.7 C) 99.3 F (37.4 C) 98.3 F (36.8 C) 98.2 F (36.8 C)  TempSrc: Oral Oral Oral Oral  SpO2: 94% 93% 93% 92%  Weight:      Height:        Intake/Output Summary (Last 24 hours) at 07/06/2020 1153 Last data filed at 07/06/2020 1100 Gross per 24 hour  Intake 1004.39 ml  Output  1240 ml  Net -235.61 ml   Filed Weights   06/29/20 0412 06/30/20 0252 07/01/20 0359  Weight: 94.2 kg 93.2 kg 93.9 kg    Examination:  General exam: Appears calm and comfortable  Respiratory system: Right chest tube in place with drainage of purulent discharge, decreased breath sounds at right base, mild scattered rhonchi on right upper lung, left is clear Cardiovascular system: S1 & S2 heard, RRR.   Gastrointestinal system: Soft, nontender, nondistended, bowel sounds positive. Central nervous system: Alert and oriented. No focal neurological deficits. Extremities: No edema, no cyanosis, pulses intact and symmetrical. Psychiatry: Judgement and insight appear normal. Mood & affect appropriate.    DVT prophylaxis: lovenox Code Status: Full Family Communication: wife updated telephonically 12/11 Disposition Plan:  Status is: Inpatient  Remains inpatient appropriate because:Inpatient level of care appropriate due to severity of illness   Dispo: The patient is from: Home              Anticipated d/c is to: Home              Anticipated d/c date is: 1 day              Patient currently is not medically stable to d/c.  Consultants:   pulmonology  IR  Procedures:  Antimicrobials:  Vancomycin 12/6-12/7 Cefepime 12/6-12/7 Zithromax 12/6-12/7 Unasyn 12/7 -  Data Reviewed: I have personally reviewed following labs and imaging studies  CBC: Recent Labs  Lab 07/02/20 0523 07/03/20 0441 07/04/20 0424 07/05/20 0439 07/06/20 0750  WBC 16.2* 18.5* 19.8* 20.9* 20.1*  HGB 11.2* 10.7* 10.0* 9.7* 10.2*  HCT 33.9* 31.4* 30.9* 29.2* 30.1*  MCV 93.4 92.1 94.5 92.1 91.8  PLT 316 332 358 423* 267*   Basic Metabolic Panel: Recent Labs  Lab 07/02/20 0523 07/03/20 0441 07/04/20 0424 07/05/20 0439 07/06/20 0750  NA 136 136 138 137 136  K 3.7 3.7 4.2 3.8 3.7  CL 101 100 101 100 100  CO2 _0 GLUCOSE 119* 122* 116* 110* 109*  BUN 25* _1 CREATININE 0.91 0.98 1.02 1.00 0.92  CALCIUM 7.7* 7.5* 7.5* 7.3* 7.4*   GFR: Estimated Creatinine Clearance: 83.2 mL/min (by C-G formula based on SCr of 0.92 mg/dL). Liver Function Tests: No results for input(s): AST, ALT, ALKPHOS, BILITOT, PROT, ALBUMIN in the last 168 hours. No results for input(s): LIPASE, AMYLASE in the last 168 hours. No results for input(s): AMMONIA in the last 168 hours. Coagulation Profile: No  results for input(s): INR, PROTIME in the last 168 hours. Cardiac Enzymes: No results for input(s): CKTOTAL, CKMB, CKMBINDEX, TROPONINI in the last 168 hours. BNP (last 3 results) No results for input(s): PROBNP in the last 8760 hours. HbA1C: No results for input(s): HGBA1C in the last 72 hours. CBG: No results for input(s): GLUCAP in the last 168 hours. Lipid Profile: No results for input(s): CHOL, HDL, LDLCALC, TRIG, CHOLHDL, LDLDIRECT in the last 72 hours. Thyroid Function Tests: No results for input(s): TSH, T4TOTAL, FREET4, T3FREE, THYROIDAB in the last 72 hours. Anemia Panel: No results for input(s): VITAMINB12, FOLATE, FERRITIN, TIBC, IRON, RETICCTPCT in the last 72 hours. Sepsis Labs: No results for input(s): PROCALCITON, LATICACIDVEN in the last 168 hours.  Recent Results (from the past 240 hour(s))  Urine culture     Status: None   Collection Time: 06/28/20  9:36 AM   Specimen: In/Out Cath Urine  Result Value Ref Range Status   Specimen Description  Final    IN/OUT CATH URINE Performed at [facility], [address], [place], [place] [address]    Special Requests   Final    NONE Performed at [facility], [address], [place], [place] [address]    Culture   Final    NO GROWTH Performed at [facility], [place] [address], [place], [place] [address]    Report Status 06/30/2020 FINAL  Final  Resp Panel by RT-PCR (Flu A&B, Covid) Nasopharyngeal Swab     Status: None   Collection Time: 06/28/20  9:52 AM   Specimen: Nasopharyngeal Swab; Nasopharyngeal(NP) swabs in vial transport medium  Result Value Ref Range Status   SARS Coronavirus 2 by RT PCR NEGATIVE NEGATIVE Final    Comment: (NOTE) SARS-CoV-2 target nucleic acids are NOT DETECTED.  The SARS-CoV-2 RNA is generally detectable in upper respiratory specimens during the acute phase of infection. The lowest concentration of SARS-CoV-2 viral copies this assay can detect is 138 copies/mL.  A negative result does not preclude SARS-Cov-2 infection and should not be used as the sole basis for treatment or other patient management decisions. A negative result may occur with  improper specimen collection/handling, submission of specimen other than nasopharyngeal swab, presence of viral mutation(s) within the areas targeted by this assay, and inadequate number of viral copies(<138 copies/mL). A negative result must be combined with clinical observations, patient history, and epidemiological information. The expected result is Negative.  Fact Sheet for Patients:  EntrepreneurPulse.com.au  Fact Sheet for Healthcare Providers:  IncredibleEmployment.be  This test is no t yet approved or cleared by the [place] FDA and  has been authorized for detection and/or diagnosis of SARS-CoV-2 by FDA under an Emergency Use Authorization (EUA). This EUA will remain  in effect (meaning this test can be used) for the duration of the COVID-19 declaration under Section 564(b)(1) of the Act, 21 U.S.C.section 360bbb-3(b)(1), unless the authorization is terminated  or revoked sooner.       Influenza A by PCR NEGATIVE NEGATIVE Final   Influenza B by PCR NEGATIVE NEGATIVE Final    Comment: (NOTE) The Xpert Xpress SARS-CoV-2/FLU/RSV plus assay is intended as an aid in the diagnosis of influenza from Nasopharyngeal swab specimens and should not be used as a sole basis for treatment. Nasal washings and aspirates are unacceptable for Xpert Xpress SARS-CoV-2/FLU/RSV testing.  Fact Sheet for Patients: EntrepreneurPulse.com.au  Fact Sheet for Healthcare Providers: IncredibleEmployment.be  This test is not yet approved or cleared by the [place] FDA and has been authorized for detection and/or diagnosis of SARS-CoV-2 by FDA under an Emergency Use Authorization (EUA). This EUA will remain in effect (meaning this test  can be used) for the duration of the COVID-19 declaration under Section 564(b)(1) of the Act, 21 U.S.C. section 360bbb-3(b)(1), unless the authorization is terminated or revoked.  Performed at [facility], [address] [place], [place] [address]   Blood culture (routine x 2)     Status: None   Collection Time: 06/28/20  9:52 AM   Specimen: BLOOD RIGHT HAND  Result Value Ref Range Status   Specimen Description BLOOD RIGHT HAND  Final   Special Requests   Final    BOTTLES DRAWN AEROBIC AND ANAEROBIC Blood Culture adequate volume   Culture   Final    NO GROWTH 5 DAYS Performed at [facility] [address], [place], [place] [address]    Report Status 07/03/2020 FINAL  Final  Blood culture (  routine x 2)     Status: None   Collection Time: 06/28/20  9:52 AM   Specimen: BLOOD RIGHT ARM  Result Value Ref Range Status   Specimen Description BLOOD RIGHT ARM  Final   Special Requests   Final    BOTTLES DRAWN AEROBIC AND ANAEROBIC Blood Culture adequate volume   Culture   Final    NO GROWTH 5 DAYS Performed at [facility], [address] [place], [place] [address]    Report Status 07/03/2020 FINAL  Final  Body fluid culture     Status: None   Collection Time: 06/28/20  2:17 PM   Specimen: Pleura; Body Fluid  Result Value Ref Range Status   Specimen Description   Final    PLEURAL Performed at [facility], [address] [place], [place] [address]    Special Requests   Final    NONE Performed at [facility], [address] [place], [place] [address]    Gram Stain NO WBC SEEN NO ORGANISMS SEEN   Final   Culture   Final    RARE STREPTOCOCCUS INTERMEDIUS CRITICAL RESULT CALLED TO, READ BACK BY AND VERIFIED WITH: RN A.LEE AT 7341 ON 07/01/2020 BY T.SAAD Performed at [facility], [place] [address], [place], [place] [address]    Report Status 07/04/2020 FINAL  Final   Organism ID, Bacteria STREPTOCOCCUS INTERMEDIUS   Final      Susceptibility   Streptococcus intermedius - MIC*    PENICILLIN <=0.06 SENSITIVE Sensitive     CEFTRIAXONE 0.25 SENSITIVE Sensitive     LEVOFLOXACIN 0.5 SENSITIVE Sensitive     VANCOMYCIN 0.5 SENSITIVE Sensitive     * RARE STREPTOCOCCUS INTERMEDIUS  Aerobic/Anaerobic Culture (surgical/deep wound)     Status: None   Collection Time: 06/28/20  2:17 PM   Specimen: Abscess  Result Value Ref Range Status   Specimen Description   Final    ABSCESS Performed at [facility], [address], [place], [place] [address]    Special Requests   Final    EMPYEMA Performed at [facility], [address] [place], [place] [address]    Gram Stain   Final    ABUNDANT WBC PRESENT,BOTH PMN AND MONONUCLEAR NO ORGANISMS SEEN    Culture   Final    RARE STREPTOCOCCUS INTERMEDIUS NO ANAEROBES ISOLATED Performed at [facility], [place] [address] [place], [place] [address]    Report Status 07/03/2020 FINAL  Final   Organism ID, Bacteria STREPTOCOCCUS INTERMEDIUS  Final      Susceptibility   Streptococcus intermedius - MIC*    PENICILLIN <=0.06 SENSITIVE Sensitive     CEFTRIAXONE <=0.12 SENSITIVE Sensitive     ERYTHROMYCIN <=0.12 SENSITIVE Sensitive     LEVOFLOXACIN 0.5 SENSITIVE Sensitive     VANCOMYCIN 0.5 SENSITIVE Sensitive     * RARE STREPTOCOCCUS INTERMEDIUS  MRSA PCR Screening     Status: None   Collection Time: 06/29/20 11:55 AM   Specimen: Nasal Mucosa; Nasopharyngeal  Result Value Ref Range Status   MRSA by PCR NEGATIVE NEGATIVE Final    Comment:        The GeneXpert MRSA Assay (FDA approved for NASAL specimens only), is one component of a comprehensive MRSA colonization surveillance program. It is not intended to diagnose MRSA infection nor to guide or monitor treatment for MRSA infections. Performed at [facility], [address], [place],  [address]      Radiology  Studies: CT CHEST WO CONTRAST  Result Date:  07/04/2020 CLINICAL DATA:  Chest tube placement 06/28/2020, empyema, persistent cough EXAM: CT CHEST WITHOUT CONTRAST TECHNIQUE: Multidetector CT imaging of the chest was performed following the standard protocol without IV contrast. COMPARISON:  CT 07/01/2020 FINDINGS: Cardiovascular: Limited assessment in the absence of contrast media. Cardiac size within normal limits. Three-vessel coronary artery atherosclerosis is present. Trace pericardial effusion. Atherosclerotic plaque within the normal caliber aorta. No periaortic stranding or hemorrhage. No hyperdense mural thickening or plaque displacement. Shared origin of the brachiocephalic and left common carotid arteries. Minimal plaque in the proximal great vessels. Central pulmonary arteries are normal caliber. No major venous abnormalities. Mediastinum/Nodes: Persistent scattered subcentimeter low-attenuation mediastinal and hilar nodes, the latter of which are difficult to fully assess in the absence of contrast media. No new enlarged or enlarging nodes. No axillary adenopathy. Scattered secretions again seen in the trachea. Some mild central airways thickening is noted as well. No acute abnormality of the esophagus. Thyroid gland and thoracic inlet are unremarkable. Lungs/Pleura: A right pigtail pleural drain is in place, entering along the anterior axillary line at the third interspace, terminating within a heterogeneous, thick-walled hydropneumothorax. Loculated components are again seen extending into the minor fissure and to a lesser extent the major fissure as well. Redemonstration of a spiculated nodule towards the right lung apex measuring up to 14 mm in size (3/26). An additional cavitary nodule is seen in the medial right lower lobe (3/73) measuring approximately 1.8 x 1.4 cm, increased from 1.6 x 0.0 cm on prior. Background of emphysematous changes. Small bland left pleural effusion. No left pneumothorax. Upper Abdomen: Stable mild symmetric  bilateral perinephric stranding, a nonspecific finding which may correlate with advanced age or decreased renal function. No acute abnormalities present in the visualized portions of the upper abdomen. Musculoskeletal: Exaggerated thoracic kyphosis and mild levocurvature of the thoracic spine. Cervical fusion, incompletely assessed. No acute osseous abnormality or suspicious osseous lesion. No suspicious chest wall masses or lesions. IMPRESSION: 1. Right pigtail pleural drain in place, terminating within a complex, thick-walled hydropneumothorax. Loculated components are again seen extending into the minor fissure and to a lesser extent the major fissure as well. Overall size and extent of this collection is not significantly changed from the comparison. 2. Scattered secretions seen within the airways. 3. Slight interval increase in size of the cavitary nodule with layering air-fluid level in the medial right lower lobe. Could reflect intrapulmonary abscess or cavitary nodule. 4. Stable spiculated nodule towards the right lung apex measuring up to 14 mm in size. 5. Small left pleural effusion and atelectasis. 6. Aortic Atherosclerosis (ICD10-I70.0) 7. Emphysema (ICD10-J43.9). Electronically Signed   By: Lovena Le M.D.   On: 07/04/2020 19:51    Scheduled Meds: . enoxaparin (LOVENOX) injection  40 mg Subcutaneous Q24H  . guaiFENesin  1,200 mg Oral BID  . mouth rinse  15 mL Mouth Rinse BID  . multivitamin with minerals  1 tablet Oral Daily  . nicotine  14 mg Transdermal Daily   Continuous Infusions: . sodium chloride 10 mL/hr at 07/05/20 1226  . ampicillin-sulbactam (UNASYN) IV 3 g (07/06/20 1138)     LOS: 7 days   Time spent: 25 minutes.  Desma Maxim, MD [facility]  If 7PM-7AM, please contact night-coverage [email or web address]  07/06/2020, 11:53 AM

## 2020-07-07 LAB — BASIC METABOLIC PANEL
Anion gap: 10 (ref 5–15)
BUN: 15 mg/dL (ref 8–23)
CO2: 27 mmol/L (ref 22–32)
Calcium: 7.7 mg/dL — ABNORMAL LOW (ref 8.9–10.3)
Chloride: 101 mmol/L (ref 98–111)
Creatinine, Ser: 1 mg/dL (ref 0.61–1.24)
GFR, Estimated: >60 mL/min (ref >60)
Glucose, Bld: 109 mg/dL — ABNORMAL HIGH (ref 70–99)
Potassium: 3.7 mmol/L (ref 3.5–5.1)
Sodium: 138 mmol/L (ref 135–145)

## 2020-07-07 LAB — CBC
HCT: 31.4 % — ABNORMAL LOW (ref 39.0–52.0)
Hemoglobin: 10.6 g/dL — ABNORMAL LOW (ref 13.0–17.0)
MCH: 31 pg (ref 26.0–34.0)
MCHC: 33.8 g/dL (ref 30.0–36.0)
MCV: 91.8 fL (ref 80.0–100.0)
Platelets: 479 10*3/uL — ABNORMAL HIGH (ref 150–400)
RBC: 3.42 MIL/uL — ABNORMAL LOW (ref 4.22–5.81)
RDW: 14.4 % (ref 11.5–15.5)
WBC: 18.8 10*3/uL — ABNORMAL HIGH (ref 4.0–10.5)
nRBC: 0 % (ref 0.0–0.2)

## 2020-07-07 NOTE — Progress Notes (Signed)
PROGRESS NOTE    Luis Haney  IRW:[id number] DOB: October 06, 1950 DOA: 06/28/2020 PCP: [facility]   Chief Complaint  Patient presents with  . Shortness of Breath    3 days  . Fever  Brief Narrative: 69 year old male with history of left lower back pain, right rotator cuff tendinitis, COPD, erectile dysfunction presented to the ED with right-sided upper back pain and shortness of breath.  She is a current smoker and having increasing his cough but no fever chills. Patient had CT chest with right lower lobe infiltrate pneumonia along with a loculated effusion status post chest tube placement by IR initial lab with exudative fluid also found to have a spiculated 10 mm right upper lobe nodule.  Subjective: Seen this morning he feels well.  Wondering when his chest tube will come out.  Denies chest pain shortness of breath fever chills pain   Assessment & Plan: Severe sepsis with pneumonia/empyema/Pleural effusion, right: History as post chest tube placement managed by IR, initial level exudative overall clinically improving.  Repeat CT chest 12/9 shows decreased size of parapneumonic effusion and repeat 12/12 showed stability in complex loculated thick-walled hydropneumothorax.  Culture growing strep intermedius in both pleural fluid.  Patient had Vanco cefepime and was changed to Unasyn 12/7.  Continue current antibiotics.  Discussed with IR Dr Serafina Royals: He advised to continue with chest tube until output is less than 50 mL and advises pulmonary eval. Will d/w  Pulm-sent message.Patient with persistent leukocytosis.  Acute hypoxic respiratory failure he is off oxygen currently.  Stable  AKI due to dehydration improved.  COPD not in exacerbation continue as needed bronchodilators  Tobacco abuse-cessation advised smokes 1-1 and half pack per day.  Continue nicotine patch  Spiculated pulmonary nodule will need outpatient PET scan as recommended by radiology once acute pneumonia  resolves.  Cytology was negative for malignancy.  Nutrition: Diet Order            Diet regular Room service appropriate? Yes; Fluid consistency: Thin  Diet effective now                 Body mass index is 27.31 kg/m.  DVT prophylaxis: enoxaparin (LOVENOX) injection 40 mg Start: 07/02/20 1000 Place TED hose Start: 06/28/20 1352 Code Status:   Code Status: Full Code  Family Communication: plan of care discussed with patient at bedside.  Status is: Inpatient Remains inpatient appropriate because:IV treatments appropriate due to intensity of illness or inability to take PO and Inpatient level of care appropriate due to severity of illness  Dispo: The patient is from: Home              Anticipated d/c is to: Home              Anticipated d/c date is: 2 days              Patient currently is not medically stable to d/c.  Consultants:see note  Procedures:see note  Culture/Microbiology    Component Value Date/Time   SDES  06/28/2020 1417    PLEURAL Performed at [facility], [address] [place], [place] [address]    SDES  06/28/2020 1417    ABSCESS Performed at [facility], [address]elaine Bhat [place], [place] [address]    [facility]  06/28/2020 1417    NONE Performed at [facility], [address] Madelaine Bhat [place], [place] [address]    [facility]  06/28/2020 1417    EMPYEMA Performed at [facility], [place], [place] [address]    CULT  06/28/2020 1417    RARE STREPTOCOCCUS INTERMEDIUS CRITICAL RESULT CALLED TO, READ BACK BY AND VERIFIED WITH: RN A.LEE AT 1131 ON 07/01/2020 BY T.SAAD Performed at [facility], [place] [address] [place], [place] [address]    CULT  06/28/2020 1417    RARE STREPTOCOCCUS INTERMEDIUS NO ANAEROBES ISOLATED Performed at [facility], [place] [address] [place], [place] [address]    REPTSTATUS 07/04/2020 FINAL 06/28/2020 1417   REPTSTATUS 07/03/2020 FINAL 06/28/2020 1417     Other culture-see note  Medications: Scheduled Meds: . enoxaparin (LOVENOX) injection  40 mg Subcutaneous Q24H  . guaiFENesin  1,200 mg Oral BID  . mouth rinse  15 mL Mouth Rinse BID  . multivitamin with minerals  1 tablet Oral Daily  . nicotine  14 mg Transdermal Daily   Continuous Infusions: . sodium chloride Stopped (07/05/20 1904)  . ampicillin-sulbactam (UNASYN) IV Stopped (07/07/20 1255)    Antimicrobials: Anti-infectives (From admission, onward)   Start     Dose/Rate Route Frequency Ordered Stop   06/29/20 1800  Ampicillin-Sulbactam (UNASYN) 3 g in sodium chloride 0.9 % 100 mL IVPB        3 g 200 mL/hr over 30 Minutes Intravenous Every 6 hours 06/29/20 1204     06/29/20 1100  ceFEPIme (MAXIPIME) 2 g in sodium chloride 0.9 % 100 mL IVPB  Status:  Discontinued        2 g 200 mL/hr over 30 Minutes Intravenous Every 24 hours 06/28/20 1620 06/29/20 1204   06/29/20 0800  vancomycin (VANCOCIN) IVPB 1000 mg/200 mL premix  Status:  Discontinued        1,000 mg 200 mL/hr over 60 Minutes Intravenous Every 24 hours 06/28/20 1620 06/29/20 1356   06/28/20 1500  azithromycin (ZITHROMAX) 500 mg in sodium chloride 0.9 % 250 mL IVPB  Status:  Discontinued        500 mg 250 mL/hr over 60 Minutes Intravenous Every 24 hours 06/28/20 1427 06/30/20 1606   06/28/20 1115  vancomycin (VANCOREADY) IVPB 1250 mg/250 mL        1,250 mg 166.7 mL/hr over 90 Minutes Intravenous  Once 06/28/20 1048 06/28/20 1340   06/28/20 1100  ceFEPIme (MAXIPIME) 2 g in sodium chloride 0.9 % 100 mL IVPB        2 g 200 mL/hr over 30 Minutes Intravenous  Once 06/28/20 1048 06/28/20 1222     Objective: Vitals: Today's Vitals   07/07/20 0810 07/07/20 1000 07/07/20 1117 07/07/20 1548  BP: 115/65  (!) 107/56 (!) 98/57  Pulse: 81  78 80  Resp: 18  20 (!) 24  Temp: 98.2 F (36.8 C)  98.6 F (37 C) 98.5 F (36.9 C)  TempSrc: Oral  Oral Oral  SpO2: 93%  95% 93%  Weight:      Height:      PainSc:  0-No pain       Intake/Output Summary (Last 24 hours) at 07/07/2020 1557 Last data filed at 07/07/2020 1510 Gross per 24 hour  Intake 813.38 ml  Output 963 ml  Net -149.62 ml   Filed Weights   06/29/20 0412 06/30/20 0252 07/01/20 0359  Weight: 94.2 kg 93.2 kg 93.9 kg   Weight change:   Intake/Output from previous day: 12/14 0701 - 12/15 0700 In: 2184.7 [P.O.:1540; I.V.:0.8; IV Piggyback:643.9] Out: 1273 [Urine:1200; Chest Tube:73] Intake/Output this shift: Total I/O In: 196.1 [P.O.:40; IV Piggyback:156.1] Out: -  Examination: General exam: AAOx3 ,NAD, weak appearing. HEENT:Oral mucosa moist, Ear/Nose WNL grossly,dentition normal. Respiratory system: bilaterally clear with basal crackles,no wheezing or crackles,no use of accessory muscle, non tender.  Chest tube on the right chest. Cardiovascular system: S1 & S2 +, regular, No JVD. Gastrointestinal system: Abdomen soft, NT,ND, BS+. Nervous System:Alert, awake, moving extremities and grossly nonfocal Extremities: No edema, distal peripheral pulses palpable.  Skin: No rashes,no icterus. MSK: Normal muscle bulk,tone, power  Data Reviewed: I have personally reviewed following labs and imaging studies CBC: Recent Labs  Lab 07/03/20 0441 07/04/20 0424 07/05/20 0439 07/06/20 0750 07/07/20 0524  WBC 18.5* 19.8* 20.9* 20.1* 18.8*  HGB 10.7* 10.0* 9.7* 10.2* 10.6*  HCT 31.4* 30.9* 29.2* 30.1* 31.4*  MCV 92.1 94.5 92.1 91.8 91.8  PLT 332 358 423* 431* 878*   Basic Metabolic Panel: Recent Labs  Lab 07/03/20 0441 07/04/20 0424 07/05/20 0439 07/06/20 0750 07/07/20 0524  NA 136 138 137 136 138  K 3.7 4.2 3.8 3.7 3.7  CL 100 101 100 100 101  CO2 28 31 30 26 27   GLUCOSE 122* 116* 110* 109* 109*  BUN 22 19 18 16 15   CREATININE 0.98 1.02 1.00 0.92 1.00  CALCIUM 7.5* 7.5* 7.3* 7.4* 7.7*   GFR: Estimated Creatinine Clearance: 76.6 mL/min (by C-G formula based on SCr of 1 mg/dL). Liver Function Tests: No results for input(s): AST,  ALT, ALKPHOS, BILITOT, PROT, ALBUMIN in the last 168 hours. No results for input(s): LIPASE, AMYLASE in the last 168 hours. No results for input(s): AMMONIA in the last 168 hours. Coagulation Profile: No results for input(s): INR, PROTIME in the last 168 hours. Cardiac Enzymes: No results for input(s): CKTOTAL, CKMB, CKMBINDEX, TROPONINI in the last 168 hours. BNP (last 3 results) No results for input(s): PROBNP in the last 8760 hours. HbA1C: No results for input(s): HGBA1C in the last 72 hours. CBG: No results for input(s): GLUCAP in the last 168 hours. Lipid Profile: No results for input(s): CHOL, HDL, LDLCALC, TRIG, CHOLHDL, LDLDIRECT in the last 72 hours. Thyroid Function Tests: No results for input(s): TSH, T4TOTAL, FREET4, T3FREE, THYROIDAB in the last 72 hours. Anemia Panel: No results for input(s): VITAMINB12, FOLATE, FERRITIN, TIBC, IRON, RETICCTPCT in the last 72 hours. Sepsis Labs: No results for input(s): PROCALCITON, LATICACIDVEN in the last 168 hours.  Recent Results (from the past 240 hour(s))  Urine culture     Status: None   Collection Time: 06/28/20  9:36 AM   Specimen: In/Out Cath Urine  Result Value Ref Range Status   Specimen Description   Final    IN/OUT CATH URINE Performed at [facility], [address], [place], [place] [address]    Special Requests   Final    NONE Performed at [facility], [address] [place], [place] [address]    Culture   Final    NO GROWTH Performed at [facility], [place] [address], [place], [place] [address]    Report Status 06/30/2020 FINAL  Final  Resp Panel by RT-PCR (Flu A&B, Covid) Nasopharyngeal Swab     Status: None   Collection Time: 06/28/20  9:52 AM   Specimen: Nasopharyngeal Swab; Nasopharyngeal(NP) swabs in vial transport medium  Result Value Ref Range Status   SARS Coronavirus 2 by RT PCR NEGATIVE NEGATIVE Final    Comment: (NOTE) SARS-CoV-2 target nucleic acids are NOT  DETECTED.  The SARS-CoV-2 RNA is generally detectable in upper respiratory specimens during the acute phase of  infection. The lowest concentration of SARS-CoV-2 viral copies this assay can detect is 138 copies/mL. A negative result does not preclude SARS-Cov-2 infection and should not be used as the sole basis for treatment or other patient management decisions. A negative result may occur with  improper specimen collection/handling, submission of specimen other than nasopharyngeal swab, presence of viral mutation(s) within the areas targeted by this assay, and inadequate number of viral copies(<138 copies/mL). A negative result must be combined with clinical observations, patient history, and epidemiological information. The expected result is Negative.  Fact Sheet for Patients:  EntrepreneurPulse.com.au  Fact Sheet for Healthcare Providers:  IncredibleEmployment.be  This test is no t yet approved or cleared by the [place] FDA and  has been authorized for detection and/or diagnosis of SARS-CoV-2 by FDA under an Emergency Use Authorization (EUA). This EUA will remain  in effect (meaning this test can be used) for the duration of the COVID-19 declaration under Section 564(b)(1) of the Act, 21 U.S.C.section 360bbb-3(b)(1), unless the authorization is terminated  or revoked sooner.       Influenza A by PCR NEGATIVE NEGATIVE Final   Influenza B by PCR NEGATIVE NEGATIVE Final    Comment: (NOTE) The Xpert Xpress SARS-CoV-2/FLU/RSV plus assay is intended as an aid in the diagnosis of influenza from Nasopharyngeal swab specimens and should not be used as a sole basis for treatment. Nasal washings and aspirates are unacceptable for Xpert Xpress SARS-CoV-2/FLU/RSV testing.  Fact Sheet for Patients: EntrepreneurPulse.com.au  Fact Sheet for Healthcare Providers: IncredibleEmployment.be  This test is not yet  approved or cleared by the [place] FDA and has been authorized for detection and/or diagnosis of SARS-CoV-2 by FDA under an Emergency Use Authorization (EUA). This EUA will remain in effect (meaning this test can be used) for the duration of the COVID-19 declaration under Section 564(b)(1) of the Act, 21 U.S.C. section 360bbb-3(b)(1), unless the authorization is terminated or revoked.  Performed at [facility], [address] [place], [place] [address]   Blood culture (routine x 2)     Status: None   Collection Time: 06/28/20  9:52 AM   Specimen: BLOOD RIGHT HAND  Result Value Ref Range Status   Specimen Description BLOOD RIGHT HAND  Final   Special Requests   Final    BOTTLES DRAWN AEROBIC AND ANAEROBIC Blood Culture adequate volume   Culture   Final    NO GROWTH 5 DAYS Performed at [facility], [address] [place], [place] [address]    Report Status 07/03/2020 FINAL  Final  Blood culture (routine x 2)     Status: None   Collection Time: 06/28/20  9:52 AM   Specimen: BLOOD RIGHT ARM  Result Value Ref Range Status   Specimen Description BLOOD RIGHT ARM  Final   Special Requests   Final    BOTTLES DRAWN AEROBIC AND ANAEROBIC Blood Culture adequate volume   Culture   Final    NO GROWTH 5 DAYS Performed at [facility], [address], [place], [place] [address]    Report Status 07/03/2020 FINAL  Final  Body fluid culture     Status: None   Collection Time: 06/28/20  2:17 PM   Specimen: Pleura; Body Fluid  Result Value Ref Range Status   Specimen Description   Final    PLEURAL Performed at [facility], [address], [place], [place] [address]    Special Requests   Final    NONE Performed at  [place], [place] [address]    Gram Stain NO WBC SEEN NO ORGANISMS SEEN   Final   Culture   Final    RARE STREPTOCOCCUS INTERMEDIUS CRITICAL RESULT CALLED TO, READ BACK BY AND VERIFIED  WITH: RN A.LEE AT 3295 ON 07/01/2020 BY T.SAAD Performed at [facility], [place] [address], [place], [place] [address]    Report Status 07/04/2020 FINAL  Final   Organism ID, Bacteria STREPTOCOCCUS INTERMEDIUS  Final      Susceptibility   Streptococcus intermedius - MIC*    PENICILLIN <=0.06 SENSITIVE Sensitive     CEFTRIAXONE 0.25 SENSITIVE Sensitive     LEVOFLOXACIN 0.5 SENSITIVE Sensitive     VANCOMYCIN 0.5 SENSITIVE Sensitive     * RARE STREPTOCOCCUS INTERMEDIUS  Aerobic/Anaerobic Culture (surgical/deep wound)     Status: None   Collection Time: 06/28/20  2:17 PM   Specimen: Abscess  Result Value Ref Range Status   Specimen Description   Final    ABSCESS Performed at [facility], [address], [place], [place] [address]    Special Requests   Final    EMPYEMA Performed at [facility], [address] [place], [place] [address]    Gram Stain   Final    ABUNDANT WBC PRESENT,BOTH PMN AND MONONUCLEAR NO ORGANISMS SEEN    Culture   Final    RARE STREPTOCOCCUS INTERMEDIUS NO ANAEROBES ISOLATED Performed at [facility], [place] [address] [place], [place] [address]    Report Status 07/03/2020 FINAL  Final   Organism ID, Bacteria STREPTOCOCCUS INTERMEDIUS  Final      Susceptibility   Streptococcus intermedius - MIC*    PENICILLIN <=0.06 SENSITIVE Sensitive     CEFTRIAXONE <=0.12 SENSITIVE Sensitive     ERYTHROMYCIN <=0.12 SENSITIVE Sensitive     LEVOFLOXACIN 0.5 SENSITIVE Sensitive     VANCOMYCIN 0.5 SENSITIVE Sensitive     * RARE STREPTOCOCCUS INTERMEDIUS  MRSA PCR Screening     Status: None   Collection Time: 06/29/20 11:55 AM   Specimen: Nasal Mucosa; Nasopharyngeal  Result Value Ref Range Status   MRSA by PCR NEGATIVE NEGATIVE Final    Comment:        The GeneXpert MRSA Assay (FDA approved for NASAL specimens only), is one component of a comprehensive MRSA colonization surveillance program. It is not intended to diagnose  MRSA infection nor to guide or monitor treatment for MRSA infections. Performed at [facility], [address] [place], [place] [address]      Radiology Studies: No results found.   LOS: 8 days   Antonieta Pert, MD [facility]  07/07/2020, 3:57 PM

## 2020-07-07 NOTE — Progress Notes (Signed)
   07/07/20 1548  Assess: MEWS Score  Temp 98.5 F (36.9 C)  BP (!) 98/57  Pulse Rate 80  Resp (!) 24  SpO2 93 %  O2 Device Room Air  Assess: MEWS Score  MEWS Temp 0  MEWS Systolic 1  MEWS Pulse 0  MEWS RR 1  MEWS LOC 0  MEWS Score 2  MEWS Score Color Yellow  Assess: if the MEWS score is Yellow or Red  Were vital signs taken at a resting state? Yes  Focused Assessment No change from prior assessment  Early Detection of Sepsis Score *See Row Information* Low  MEWS guidelines implemented *See Row Information* No, vital signs rechecked   Vitals rechecked at 1630. BP was 111/56 (69), HR was 81, O2 was 97%, Respirations were 22, and temp was 98.5.

## 2020-07-07 NOTE — Consult Note (Signed)
Date: 07/07/2020,   MRN# [id number] Luis Haney 10-13-1950 Code Status:     Code Status Orders  (From admission, onward)         Start     Ordered   06/28/20 1352  Full code  Continuous        06/28/20 1353        Code Status History    This patient has a current code status but no historical code status.   Advance Care Planning Activity     Hosp day:@LENGTHOFSTAYDAYS @ Referring MD: @ATDPROV @     PCP:  CC: empyma  HPI: This is a 69 yr old male here with right empyema, s/p root canal recently, no trauma, ex smoker, no hx of aspiration, pig tail in place, drained 45 cc in 17 hours. Cytology, no malignancy. No fever, hemoptyisis. Chart reviewd  PMHX:   History reviewed. No pertinent past medical history. Surgical Hx:  Past Surgical History:  Procedure Laterality Date   BACK SURGERY  2000   NECK SURGERY  2012   Family Hx:  Family History  Problem Relation Age of Onset   Heart disease Mother    Other Father        "old age"   Social Hx:   Social History   Tobacco Use   Smoking status: Current Every Day Smoker    Packs/day: 0.25    Types: Cigarettes   Smokeless tobacco: Never Used  Vaping Use   Vaping Use: Never used  Substance Use Topics   Alcohol use: Yes    Comment: beer occasionally   Drug use: Not Currently   Medication:    Home Medication:    Current Medication: @CURMEDTAB @   Allergies:  Patient has no known allergies.  Review of Systems: Gen:  Denies  fever, sweats, chills HEENT: Denies blurred vision, double vision, ear pain, eye pain, hearing loss, nose bleeds, sore throat Cvc:  No dizziness, chest pain or heaviness Resp: no sob, plurisy   Gi: Denies swallowing difficulty, stomach pain, nausea or vomiting, diarrhea, constipation, bowel incontinence Gu:  Denies bladder incontinence, burning urine Ext:   No Joint pain, stiffness or swelling Skin: No skin rash, easy bruising or bleeding or hives Endoc:  No polyuria, polydipsia  , polyphagia or weight change Psych: No depression, insomnia or hallucinations  Other:  All other systems negative  Physical Examination:   VS: BP 115/65 (BP Location: Right Arm)    Pulse 81    Temp 98.5 F (36.9 C) (Oral)    Resp (!) 24    Ht 6\' 1"  (1.854 m)    Wt 93.9 kg    SpO2 97%    BMI 27.31 kg/m   General Appearance: No distress  Neuro: without focal findings, mental status, speech normal, alert and oriented, cranial nerves 2-12 intact, reflexes normal and symmetric, sensation grossly normal  HEENT: PERRLA, EOM intact, no ptosis, no other lesions noticed, Mallampati: Pulmonary:.No wheezing, No rales decrease bs at right bases   Cardiovascular:  Normal S1,S2.  No m/r/g.  Abdominal aorta pulsation normal.    Abdomen:Benign, Soft, non-tender, No masses, hepatosplenomegaly, No lymphadenopathy Endoc: No evident thyromegaly, no signs of acromegaly or Cushing features Skin:   warm, no rashes, no ecchymosis  Extremities: normal, no cyanosis, clubbing, no edema, warm with normal capillary refill. Other findings:   Labs results:   Recent Labs    07/05/20 0439 07/06/20 0750 07/07/20 0524  HGB 9.7* 10.2* 10.6*  HCT 29.2* 30.1* 31.4*  MCV 92.1  91.8 91.8  WBC 20.9* 20.1* 18.8*  BUN 18 16 15   CREATININE 1.00 0.92 1.00  GLUCOSE 110* 109* 109*  CALCIUM 7.3* 7.4* 7.7*  ,         COMPARISON:  CT 07/01/2020  FINDINGS: Cardiovascular: Limited assessment in the absence of contrast media. Cardiac size within normal limits. Three-vessel coronary artery atherosclerosis is present. Trace pericardial effusion. Atherosclerotic plaque within the normal caliber aorta. No periaortic stranding or hemorrhage. No hyperdense mural thickening or plaque displacement. Shared origin of the brachiocephalic and left common carotid arteries. Minimal plaque in the proximal great vessels. Central pulmonary arteries are normal caliber. No major venous abnormalities.  Mediastinum/Nodes: Persistent  scattered subcentimeter low-attenuation mediastinal and hilar nodes, the latter of which are difficult to fully assess in the absence of contrast media. No new enlarged or enlarging nodes. No axillary adenopathy. Scattered secretions again seen in the trachea. Some mild central airways thickening is noted as well. No acute abnormality of the esophagus. Thyroid gland and thoracic inlet are unremarkable.  Lungs/Pleura: A right pigtail pleural drain is in place, entering along the anterior axillary line at the third interspace, terminating within a heterogeneous, thick-walled hydropneumothorax. Loculated components are again seen extending into the minor fissure and to a lesser extent the major fissure as well. Redemonstration of a spiculated nodule towards the right lung apex measuring up to 14 mm in size (3/26). An additional cavitary nodule is seen in the medial right lower lobe (3/73) measuring approximately 1.8 x 1.4 cm, increased from 1.6 x 0.0 cm on prior. Background of emphysematous changes. Small bland left pleural effusion. No left pneumothorax.  Upper Abdomen: Stable mild symmetric bilateral perinephric stranding, a nonspecific finding which may correlate with advanced age or decreased renal function. No acute abnormalities present in the visualized portions of the upper abdomen.  Musculoskeletal: Exaggerated thoracic kyphosis and mild levocurvature of the thoracic spine. Cervical fusion, incompletely assessed. No acute osseous abnormality or suspicious osseous lesion. No suspicious chest wall masses or lesions.  IMPRESSION: 1. Right pigtail pleural drain in place, terminating within a complex, thick-walled hydropneumothorax. Loculated components are again seen extending into the minor fissure and to a lesser extent the major fissure as well. Overall size and extent of this collection is not significantly changed from the comparison. 2. Scattered secretions seen within  the airways. 3. Slight interval increase in size of the cavitary nodule with layering air-fluid level in the medial right lower lobe. Could reflect intrapulmonary abscess or cavitary nodule. 4. Stable spiculated nodule towards the right lung apex measuring up to 14 mm in size. 5. Small left pleural effusion and atelectasis. 6. Aortic Atherosclerosis (ICD10-I70.0) 7. Emphysema (ICD10-J43.9).   Electronically Signed   By: Lovena Le M.D.   On: 07/04/2020 19:51     DIAGNOSIS:  A. PLEURAL FLUID, CHEST TUBE:  - NEGATIVE; NO EVIDENCE OF MALIGNANCY.  - PREDOMINANTLY ACUTE INFLAMMATION.   Tot prot 4, LDH 1970, gluc <20,    NO WBC SEEN  NO ORGANISMS SEEN    Culture RARE STREPTOCOCCUS INTERMEDIUS     Assessment and Plan: Pleasant gentleman, here with pneumonia/lung abscess with empyema ( strep intermedius, very low pl glucose, exudate) He is not septic he is on appropriate coverage. Tube drainaged since mid night 45 CC.. no malignancy seen on the cytology. The 14 mm nodule in the right apex also need following ? Malignancy.  Will  track down any old cxr etc. Did contact Dr Genevive Bi for possible debridement, Lynelle Doctor is  covering -continue antibiotics -t-surg consult -keep tube in for now -reassess in am    I have personally obtained a history, examined the patient, evaluated laboratory and imaging results, formulated the assessment and plan and placed orders.  The Patient requires high complexity decision making for assessment and support, frequent evaluation and titration of therapies, application of advanced monitoring technologies and extensive interpretation of multiple databases.   Keishia Ground,M.D. Pulmonary & Critical care Medicine [facility]

## 2020-07-08 LAB — PROCALCITONIN: Procalcitonin: 0.12 ng/mL

## 2020-07-08 LAB — BASIC METABOLIC PANEL
Anion gap: 9 (ref 5–15)
BUN: 15 mg/dL (ref 8–23)
CO2: 25 mmol/L (ref 22–32)
Calcium: 7.5 mg/dL — ABNORMAL LOW (ref 8.9–10.3)
Chloride: 104 mmol/L (ref 98–111)
Creatinine, Ser: 0.92 mg/dL (ref 0.61–1.24)
GFR, Estimated: >60 mL/min (ref >60)
Glucose, Bld: 96 mg/dL (ref 70–99)
Potassium: 3.7 mmol/L (ref 3.5–5.1)
Sodium: 138 mmol/L (ref 135–145)

## 2020-07-08 LAB — CBC
HCT: 28.5 % — ABNORMAL LOW (ref 39.0–52.0)
Hemoglobin: 9.7 g/dL — ABNORMAL LOW (ref 13.0–17.0)
MCH: 31.4 pg (ref 26.0–34.0)
MCHC: 34 g/dL (ref 30.0–36.0)
MCV: 92.2 fL (ref 80.0–100.0)
Platelets: 458 10*3/uL — ABNORMAL HIGH (ref 150–400)
RBC: 3.09 MIL/uL — ABNORMAL LOW (ref 4.22–5.81)
RDW: 14.3 % (ref 11.5–15.5)
WBC: 15.3 10*3/uL — ABNORMAL HIGH (ref 4.0–10.5)
nRBC: 0 % (ref 0.0–0.2)

## 2020-07-08 NOTE — Progress Notes (Signed)
Date: 07/08/2020,   MRN# [id number] JHAN CONERY 01/07/1951 Code Status:     Code Status Orders  (From admission, onward)         Start     Ordered   06/28/20 1352  Full code  Continuous        06/28/20 1353        Code Status History    This patient has a current code status but no historical code status.   Advance Care Planning Activity      HPI: comfortable, eager to go home discussed the plan with him  PMHX:   History reviewed. No pertinent past medical history. Surgical Hx:  Past Surgical History:  Procedure Laterality Date   BACK SURGERY  2000   NECK SURGERY  2012   Family Hx:  Family History  Problem Relation Age of Onset   Heart disease Mother    Other Father        "old age"   Social Hx:   Social History   Tobacco Use   Smoking status: Current Every Day Smoker    Packs/day: 0.25    Types: Cigarettes   Smokeless tobacco: Never Used  Vaping Use   Vaping Use: Never used  Substance Use Topics   Alcohol use: Yes    Comment: beer occasionally   Drug use: Not Currently   Medication:    Home Medication:    Current Medication: @CURMEDTAB @   Allergies:  Patient has no known allergies.  Review of Systems: Gen:  Denies  fever, sweats, chills HEENT: Denies blurred vision, double vision, ear pain, eye pain, hearing loss, nose bleeds, sore throat Cvc:  No dizziness, chest pain or heaviness Resp:   No sob, no coughing Gi: Denies swallowing difficulty, stomach pain, nausea or vomiting, diarrhea, constipation, bowel incontinence Gu:  Denies bladder incontinence, burning urine Ext:   No Joint pain, stiffness or swelling Skin: No skin rash, easy bruising or bleeding or hives Endoc:  No polyuria, polydipsia , polyphagia or weight change Psych: No depression, insomnia or hallucinations  Other:  All other systems negative  Physical Examination:   VS: BP (!) 104/58 (BP Location: Left Arm)    Pulse 89    Temp 97.8 F (36.6 C) (Oral)    Resp  18    Ht 6\' 1"  (1.854 m)    Wt 93.9 kg    SpO2 95%    BMI 27.31 kg/m   General Appearance: No distress  Neuro: without focal findings, mental status, speech normal, alert and oriented, cranial nerves 2-12 intact, reflexes normal and symmetric, sensation grossly normal  HEENT: PERRLA, EOM intact, no ptosis, no other lesions noticed, Mallampati: Pulmonary:.No wheezing, No rales  Sputum Production:   Cardiovascular:  Normal S1,S2.  No m/r/g.  Abdominal aorta pulsation normal.    Abdomen:Benign, Soft, non-tender, No masses, hepatosplenomegaly, No lymphadenopathy Endoc: No evident thyromegaly, no signs of acromegaly or Cushing features Skin:   warm, no rashes, no ecchymosis  Extremities: normal, no cyanosis, clubbing, no edema, warm with normal capillary refill. Other findings:   Labs results:   Recent Labs    07/06/20 0750 07/07/20 0524 07/08/20 0552  HGB 10.2* 10.6* 9.7*  HCT 30.1* 31.4* 28.5*  MCV 91.8 91.8 92.2  WBC 20.1* 18.8* 15.3*  BUN 16 15 15   CREATININE 0.92 1.00 0.92  GLUCOSE 109* 109* 96  CALCIUM 7.4* 7.7* 7.5*  ,    Assessment and Plan: Pleasant gentleman, here with pneumonia/lung abscess with empyema ( strep  intermedius, very low pl glucose, exudate) He is not septic he is on appropriate coverage. Tube drainaged since mid night 75 CC.Marland Kitchen No malignancy seen on the cytology. The 14 mm nodule in the right apex also need following ? Malignancy.  Did contact Dr Genevive Bi for possible debridement, Lynelle Doctor is covering -continue antibiotics -CXR in am -may need a bigger tube with ? streptokinase if significant empyema is present -reassess in am   I have personally obtained a history, examined the patient, evaluated laboratory and imaging results, formulated the assessment and plan and placed orders.  The Patient requires high complexity decision making for assessment and support, frequent evaluation and titration of therapies, application of advanced monitoring technologies  and extensive interpretation of multiple databases.   Savir Blanke,M.D. Pulmonary & Critical care Medicine [facility]

## 2020-07-08 NOTE — Care Management Important Message (Signed)
Important Message  Patient Details  Name: Luis Haney MRN: [id number] Date of Birth: 11/27/1950   Medicare Important Message Given:  Yes     Dannette Barbara 07/08/2020, 1:29 PM

## 2020-07-08 NOTE — Progress Notes (Signed)
PROGRESS NOTE    Luis Haney  YNW:[id number] DOB: [birth year] DOA: 06/28/2020 PCP: [name]   Chief Complaint  Patient presents with   Shortness of Breath    3 days   Fever  Brief Narrative: 69 year old male with history of left lower back pain, right rotator cuff tendinitis, COPD, erectile dysfunction presented to the ED with right-sided upper back pain and shortness of breath.  She is a current smoker and having increasing his cough but no fever chills. Patient had CT chest with right lower lobe infiltrate pneumonia along with a loculated effusion status post chest tube placement by IR initial lab with exudative fluid also found to have a spiculated 10 mm right upper lobe nodule. Continues to have chest tube in place-which is draining for by pulmonary and IR  Subjective:  No acute events overnight. No fever. On room air. Denies any nausea vomiting chest pain.  Chest tube output still up 80 ml/past 24 hrs   Assessment & Plan: Severe sepsis with pneumonia/empyema/Pleural effusion, right: History as post chest tube placement managed by IR, initial level exudative. Repeat CT chest 12/9 shows decreased size of parapneumonic effusion and repeat 12/12 showed stability in complex loculated thick-walled hydropneumothorax.  Culture growing strep intermedius in both pleural fluid and pansensitive- was on Vanco cefepime and has been changed to Unasyn 12/7.  Leukocytosis persistent but improving.  Discussed with Dr. Fleming/pulmonary appreciate input - T surgery consult. I had discussed with IR Dr Serafina Royals 12/15-advised to continue with chest tube until output is less than 50 mL and advises pulmonary/ct surgery consult  Acute hypoxic respiratory failure-resolved  AKI due to dehydration resolved  COPD not in exacerbation continue as needed bronchodilators  Tobacco abuse-cessation advised, smokes 1 to 1/2 pack/day.  On nicotine patch.    Spiculated pulmonary nodule - ?malignancy-  pulm aware- Dr Raul Del will follow- PET scan as recommended by radiology once acute pneumonia resolves.  Cytology was negative for malignancy. I discussed with the patient about his lung nodules and need for follow up.  Nutrition: Diet Order            Diet regular Room service appropriate? Yes; Fluid consistency: Thin  Diet effective now                 Body mass index is 27.31 kg/m.  DVT prophylaxis: enoxaparin (LOVENOX) injection 40 mg Start: 07/02/20 1000 Place TED hose Start: 06/28/20 1352 Code Status:   Code Status: Full Code  Family Communication: plan of care discussed with patient at bedside.  Status is: Inpatient Remains inpatient appropriate because:IV treatments appropriate due to intensity of illness or inability to take PO and Inpatient level of care appropriate due to severity of illness  Dispo: The patient is from: Home              Anticipated d/c is to: Home              Anticipated d/c date is: 2-3 days              Patient currently is not medically stable to d/c.  Consultants:see note  Procedures:see note  Culture/Microbiology    Component Value Date/Time   SDES  06/28/2020 1417    PLEURAL Performed at [facility], [address] [place], [place] [address]    SDES  06/28/2020 1417    ABSCESS Performed at [facility], [address]elaine Bhat [place], [place] [address]    [facility]  06/28/2020 1417  NONE Performed at [facility], [address] Bhat [place], [place] [address]    [facility]  06/28/2020 1417    EMPYEMA Performed at [facility], [address] [place], [place] [address]    CULT  06/28/2020 1417    RARE STREPTOCOCCUS INTERMEDIUS CRITICAL RESULT CALLED TO, READ BACK BY AND VERIFIED WITH: RN A.LEE AT 1131 ON 07/01/2020 BY T.SAAD Performed at [facility], [place] [address] [place], [place] [address]    CULT  06/28/2020 1417    RARE STREPTOCOCCUS INTERMEDIUS NO ANAEROBES ISOLATED Performed  at [facility], [place] [address] [place], [place] [address]    REPTSTATUS 07/04/2020 FINAL 06/28/2020 1417   REPTSTATUS 07/03/2020 FINAL 06/28/2020 1417    Other culture-see note  Medications: Scheduled Meds:  enoxaparin (LOVENOX) injection  40 mg Subcutaneous Q24H   guaiFENesin  1,200 mg Oral BID   mouth rinse  15 mL Mouth Rinse BID   multivitamin with minerals  1 tablet Oral Daily   nicotine  14 mg Transdermal Daily   Continuous Infusions:  sodium chloride Stopped (07/05/20 1904)   ampicillin-sulbactam (UNASYN) IV 3 g (07/08/20 0523)    Antimicrobials: Anti-infectives (From admission, onward)   Start     Dose/Rate Route Frequency Ordered Stop   06/29/20 1800  Ampicillin-Sulbactam (UNASYN) 3 g in sodium chloride 0.9 % 100 mL IVPB        3 g 200 mL/hr over 30 Minutes Intravenous Every 6 hours 06/29/20 1204     06/29/20 1100  ceFEPIme (MAXIPIME) 2 g in sodium chloride 0.9 % 100 mL IVPB  Status:  Discontinued        2 g 200 mL/hr over 30 Minutes Intravenous Every 24 hours 06/28/20 1620 06/29/20 1204   06/29/20 0800  vancomycin (VANCOCIN) IVPB 1000 mg/200 mL premix  Status:  Discontinued        1,000 mg 200 mL/hr over 60 Minutes Intravenous Every 24 hours 06/28/20 1620 06/29/20 1356   06/28/20 1500  azithromycin (ZITHROMAX) 500 mg in sodium chloride 0.9 % 250 mL IVPB  Status:  Discontinued        500 mg 250 mL/hr over 60 Minutes Intravenous Every 24 hours 06/28/20 1427 06/30/20 1606   06/28/20 1115  vancomycin (VANCOREADY) IVPB 1250 mg/250 mL        1,250 mg 166.7 mL/hr over 90 Minutes Intravenous  Once 06/28/20 1048 06/28/20 1340   06/28/20 1100  ceFEPIme (MAXIPIME) 2 g in sodium chloride 0.9 % 100 mL IVPB        2 g 200 mL/hr over 30 Minutes Intravenous  Once 06/28/20 1048 06/28/20 1222     Objective: Vitals: Today's Vitals   07/07/20 1630 07/07/20 1924 07/07/20 2347 07/08/20 0506  BP: 115/65 114/75 (!) 119/41 (!) 122/59  Pulse: 81 83 75 89  Resp:  18  18   Temp: 98.5 F (36.9 C) 98.5 F (36.9 C) 97.6 F (36.4 C) 98.1 F (36.7 C)  TempSrc: Oral  Oral Oral  SpO2: 97% 97% 93% 92%  Weight:      Height:      PainSc:        Intake/Output Summary (Last 24 hours) at 07/08/2020 0834 Last data filed at 07/08/2020 0523 Gross per 24 hour  Intake 316.07 ml  Output 155 ml  Net 161.07 ml   Filed Weights   06/29/20 0412 06/30/20 0252 07/01/20 0359  Weight: 94.2 kg 93.2 kg 93.9 kg   Weight change:   Intake/Output from  previous day: 12/15 0701 - 12/16 0700 In: 316.1 [P.O.:160; IV Piggyback:156.1] Out: 155 [Urine:75; Chest Tube:80] Intake/Output this shift: No intake/output data recorded.  Examination: General exam: AAOx3, on bedside commode , NAD, weak appearing. HEENT:Oral mucosa moist, Ear/Nose WNL grossly, dentition normal. Respiratory system: bilaterally diminished air sound with no wheezing or crackles, no use of accessory muscle. Right chest tube in place Cardiovascular system: S1 & S2 +, No JVD,. Gastrointestinal system: Abdomen soft, NT,ND, BS+ Nervous System:Alert, awake, moving extremities and grossly nonfocal Extremities: No edema, distal peripheral pulses palpable.  Skin: No rashes,no icterus. MSK: Normal muscle bulk,tone, power  Data Reviewed: I have personally reviewed following labs and imaging studies CBC: Recent Labs  Lab 07/04/20 0424 07/05/20 0439 07/06/20 0750 07/07/20 0524 07/08/20 0552  WBC 19.8* 20.9* 20.1* 18.8* 15.3*  HGB 10.0* 9.7* 10.2* 10.6* 9.7*  HCT 30.9* 29.2* 30.1* 31.4* 28.5*  MCV 94.5 92.1 91.8 91.8 92.2  PLT 358 423* 431* 479* 323*   Basic Metabolic Panel: Recent Labs  Lab 07/04/20 0424 07/05/20 0439 07/06/20 0750 07/07/20 0524 07/08/20 0552  NA 138 137 136 138 138  K 4.2 3.8 3.7 3.7 3.7  CL 101 100 100 101 104  CO2 31 30 26 27 25   GLUCOSE 116* 110* 109* 109* 96  BUN 19 18 16 15 15   CREATININE 1.02 1.00 0.92 1.00 0.92  CALCIUM 7.5* 7.3* 7.4* 7.7* 7.5*   GFR: Estimated  Creatinine Clearance: 83.2 mL/min (by C-G formula based on SCr of 0.92 mg/dL). Liver Function Tests: No results for input(s): AST, ALT, ALKPHOS, BILITOT, PROT, ALBUMIN in the last 168 hours. No results for input(s): LIPASE, AMYLASE in the last 168 hours. No results for input(s): AMMONIA in the last 168 hours. Coagulation Profile: No results for input(s): INR, PROTIME in the last 168 hours. Cardiac Enzymes: No results for input(s): CKTOTAL, CKMB, CKMBINDEX, TROPONINI in the last 168 hours. BNP (last 3 results) No results for input(s): PROBNP in the last 8760 hours. HbA1C: No results for input(s): HGBA1C in the last 72 hours. CBG: No results for input(s): GLUCAP in the last 168 hours. Lipid Profile: No results for input(s): CHOL, HDL, LDLCALC, TRIG, CHOLHDL, LDLDIRECT in the last 72 hours. Thyroid Function Tests: No results for input(s): TSH, T4TOTAL, FREET4, T3FREE, THYROIDAB in the last 72 hours. Anemia Panel: No results for input(s): VITAMINB12, FOLATE, FERRITIN, TIBC, IRON, RETICCTPCT in the last 72 hours. Sepsis Labs: Recent Labs  Lab 07/08/20 0552  PROCALCITON 0.12    Recent Results (from the past 240 hour(s))  Urine culture     Status: None   Collection Time: 06/28/20  9:36 AM   Specimen: In/Out Cath Urine  Result Value Ref Range Status   Specimen Description   Final    IN/OUT CATH URINE Performed at [facility], [address], [place], [place] [address]    Special Requests   Final    NONE Performed at [facility], [address] [place], [place] [address]    Culture   Final    NO GROWTH Performed at [facility], [place] [address] [place], [place] [address]    Report Status 06/30/2020 FINAL  Final  Resp Panel by RT-PCR (Flu A&B, Covid) Nasopharyngeal Swab     Status: None   Collection Time: 06/28/20  9:52 AM   Specimen: Nasopharyngeal Swab; Nasopharyngeal(NP) swabs in vial transport medium  Result Value Ref Range Status   SARS  Coronavirus 2 by RT PCR NEGATIVE NEGATIVE Final  Comment: (NOTE) SARS-CoV-2 target nucleic acids are NOT DETECTED.  The SARS-CoV-2 RNA is generally detectable in upper respiratory specimens during the acute phase of infection. The lowest concentration of SARS-CoV-2 viral copies this assay can detect is 138 copies/mL. A negative result does not preclude SARS-Cov-2 infection and should not be used as the sole basis for treatment or other patient management decisions. A negative result may occur with  improper specimen collection/handling, submission of specimen other than nasopharyngeal swab, presence of viral mutation(s) within the areas targeted by this assay, and inadequate number of viral copies(<138 copies/mL). A negative result must be combined with clinical observations, patient history, and epidemiological information. The expected result is Negative.  Fact Sheet for Patients:  EntrepreneurPulse.com.au  Fact Sheet for Healthcare Providers:  IncredibleEmployment.be  This test is no t yet approved or cleared by the [place] FDA and  has been authorized for detection and/or diagnosis of SARS-CoV-2 by FDA under an Emergency Use Authorization (EUA). This EUA will remain  in effect (meaning this test can be used) for the duration of the COVID-19 declaration under Section 564(b)(1) of the Act, 21 U.S.C.section 360bbb-3(b)(1), unless the authorization is terminated  or revoked sooner.       Influenza A by PCR NEGATIVE NEGATIVE Final   Influenza B by PCR NEGATIVE NEGATIVE Final    Comment: (NOTE) The Xpert Xpress SARS-CoV-2/FLU/RSV plus assay is intended as an aid in the diagnosis of influenza from Nasopharyngeal swab specimens and should not be used as a sole basis for treatment. Nasal washings and aspirates are unacceptable for Xpert Xpress SARS-CoV-2/FLU/RSV testing.  Fact Sheet for  Patients: EntrepreneurPulse.com.au  Fact Sheet for Healthcare Providers: IncredibleEmployment.be  This test is not yet approved or cleared by the [place] FDA and has been authorized for detection and/or diagnosis of SARS-CoV-2 by FDA under an Emergency Use Authorization (EUA). This EUA will remain in effect (meaning this test can be used) for the duration of the COVID-19 declaration under Section 564(b)(1) of the Act, 21 U.S.C. section 360bbb-3(b)(1), unless the authorization is terminated or revoked.  Performed at [facility], [address] [place], [place] [address]   Blood culture (routine x 2)     Status: None   Collection Time: 06/28/20  9:52 AM   Specimen: BLOOD RIGHT HAND  Result Value Ref Range Status   Specimen Description BLOOD RIGHT HAND  Final   Special Requests   Final    BOTTLES DRAWN AEROBIC AND ANAEROBIC Blood Culture adequate volume   Culture   Final    NO GROWTH 5 DAYS Performed at [facility], [address] [place], [place] [address]    Report Status 07/03/2020 FINAL  Final  Blood culture (routine x 2)     Status: None   Collection Time: 06/28/20  9:52 AM   Specimen: BLOOD RIGHT ARM  Result Value Ref Range Status   Specimen Description BLOOD RIGHT ARM  Final   Special Requests   Final    BOTTLES DRAWN AEROBIC AND ANAEROBIC Blood Culture adequate volume   Culture   Final    NO GROWTH 5 DAYS Performed at [facility] [place] [facility], [address] [place],  [address]    Report Status 07/03/2020 FINAL  Final  Body fluid culture     Status: None   Collection Time: 06/28/20  2:17 PM   Specimen: Pleura; Body Fluid  Result Value Ref Range Status   Specimen Description   Final    PLEURAL Performed  at [facility], [address] [place], [place] [address]    Special Requests   Final    NONE Performed at [facility], [place], [place] [address]     Gram Stain NO WBC SEEN NO ORGANISMS SEEN   Final   Culture   Final    RARE STREPTOCOCCUS INTERMEDIUS CRITICAL RESULT CALLED TO, READ BACK BY AND VERIFIED WITH: RN A.LEE AT 1916 ON 07/01/2020 BY T.SAAD Performed at [facility] [address], [place], [place] [address]    Report Status 07/04/2020 FINAL  Final   Organism ID, Bacteria STREPTOCOCCUS INTERMEDIUS  Final      Susceptibility   Streptococcus intermedius - MIC*    PENICILLIN <=0.06 SENSITIVE Sensitive     CEFTRIAXONE 0.25 SENSITIVE Sensitive     LEVOFLOXACIN 0.5 SENSITIVE Sensitive     VANCOMYCIN 0.5 SENSITIVE Sensitive     * RARE STREPTOCOCCUS INTERMEDIUS  Aerobic/Anaerobic Culture (surgical/deep wound)     Status: None   Collection Time: 06/28/20  2:17 PM   Specimen: Abscess  Result Value Ref Range Status   Specimen Description   Final    ABSCESS Performed at [facility], [address], [place], [place] [address]    Special Requests   Final    EMPYEMA Performed at [facility], [place] [place], [place] [address]    Gram Stain   Final    ABUNDANT WBC PRESENT,BOTH PMN AND MONONUCLEAR NO ORGANISMS SEEN    Culture   Final    RARE STREPTOCOCCUS INTERMEDIUS NO ANAEROBES ISOLATED Performed at [facility], [place] [address], [place], [place] [address]    Report Status 07/03/2020 FINAL  Final   Organism ID, Bacteria STREPTOCOCCUS INTERMEDIUS  Final      Susceptibility   Streptococcus intermedius - MIC*    PENICILLIN <=0.06 SENSITIVE Sensitive     CEFTRIAXONE <=0.12 SENSITIVE Sensitive     ERYTHROMYCIN <=0.12 SENSITIVE Sensitive     LEVOFLOXACIN 0.5 SENSITIVE Sensitive     VANCOMYCIN 0.5 SENSITIVE Sensitive     * RARE STREPTOCOCCUS INTERMEDIUS  MRSA PCR Screening     Status: None   Collection Time: 06/29/20 11:55 AM   Specimen: Nasal Mucosa; Nasopharyngeal  Result Value Ref Range Status   MRSA by PCR NEGATIVE NEGATIVE Final    Comment:        The GeneXpert MRSA Assay  (FDA approved for NASAL specimens only), is one component of a comprehensive MRSA colonization surveillance program. It is not intended to diagnose MRSA infection nor to guide or monitor treatment for MRSA infections. Performed at [facility], [address] [place], [place] [address]      Radiology Studies: No results found.   LOS: 9 days   Antonieta Pert, MD [facility]  07/08/2020, 8:34 AM

## 2020-07-09 ENCOUNTER — Encounter (HOSPITAL_COMMUNITY): Payer: Self-pay | Admitting: Internal Medicine

## 2020-07-09 ENCOUNTER — Inpatient Hospital Stay: Payer: Medicare HMO

## 2020-07-09 ENCOUNTER — Inpatient Hospital Stay (HOSPITAL_COMMUNITY)
Admission: AD | Admit: 2020-07-09 | Discharge: 2020-07-15 | DRG: 871 | Disposition: A | Discharge status: Home / Self Care | Payer: Medicare HMO | Source: Other Acute Inpatient Hospital | Attending: Internal Medicine | Admitting: Internal Medicine

## 2020-07-09 DIAGNOSIS — R6520 Severe sepsis without septic shock: Secondary | ICD-10-CM | POA: Diagnosis not present

## 2020-07-09 DIAGNOSIS — J181 Lobar pneumonia, unspecified organism: Secondary | ICD-10-CM | POA: Diagnosis not present

## 2020-07-09 DIAGNOSIS — J439 Emphysema, unspecified: Secondary | ICD-10-CM | POA: Diagnosis not present

## 2020-07-09 DIAGNOSIS — A408 Other streptococcal sepsis: Secondary | ICD-10-CM | POA: Diagnosis not present

## 2020-07-09 DIAGNOSIS — Z8249 Family history of ischemic heart disease and other diseases of the circulatory system: Secondary | ICD-10-CM | POA: Diagnosis not present

## 2020-07-09 DIAGNOSIS — J90 Pleural effusion, not elsewhere classified: Secondary | ICD-10-CM

## 2020-07-09 DIAGNOSIS — N179 Acute kidney failure, unspecified: Secondary | ICD-10-CM | POA: Diagnosis not present

## 2020-07-09 DIAGNOSIS — E440 Moderate protein-calorie malnutrition: Secondary | ICD-10-CM | POA: Insufficient documentation

## 2020-07-09 DIAGNOSIS — G8929 Other chronic pain: Secondary | ICD-10-CM | POA: Diagnosis present

## 2020-07-09 DIAGNOSIS — R911 Solitary pulmonary nodule: Secondary | ICD-10-CM | POA: Diagnosis present

## 2020-07-09 DIAGNOSIS — J9601 Acute respiratory failure with hypoxia: Secondary | ICD-10-CM | POA: Diagnosis not present

## 2020-07-09 DIAGNOSIS — J869 Pyothorax without fistula: Secondary | ICD-10-CM | POA: Diagnosis present

## 2020-07-09 DIAGNOSIS — M549 Dorsalgia, unspecified: Secondary | ICD-10-CM | POA: Diagnosis present

## 2020-07-09 DIAGNOSIS — D649 Anemia, unspecified: Secondary | ICD-10-CM | POA: Diagnosis present

## 2020-07-09 DIAGNOSIS — F17210 Nicotine dependence, cigarettes, uncomplicated: Secondary | ICD-10-CM | POA: Diagnosis present

## 2020-07-09 DIAGNOSIS — J939 Pneumothorax, unspecified: Secondary | ICD-10-CM

## 2020-07-09 DIAGNOSIS — Z23 Encounter for immunization: Secondary | ICD-10-CM

## 2020-07-09 DIAGNOSIS — R079 Chest pain, unspecified: Secondary | ICD-10-CM | POA: Diagnosis present

## 2020-07-09 DIAGNOSIS — F17200 Nicotine dependence, unspecified, uncomplicated: Secondary | ICD-10-CM | POA: Diagnosis present

## 2020-07-09 DIAGNOSIS — Z9689 Presence of other specified functional implants: Secondary | ICD-10-CM

## 2020-07-09 DIAGNOSIS — R69 Illness, unspecified: Secondary | ICD-10-CM | POA: Diagnosis not present

## 2020-07-09 DIAGNOSIS — J9811 Atelectasis: Secondary | ICD-10-CM | POA: Diagnosis not present

## 2020-07-09 DIAGNOSIS — R069 Unspecified abnormalities of breathing: Secondary | ICD-10-CM

## 2020-07-09 DIAGNOSIS — R918 Other nonspecific abnormal finding of lung field: Secondary | ICD-10-CM | POA: Diagnosis not present

## 2020-07-09 HISTORY — DX: Chronic obstructive pulmonary disease, unspecified: J44.9

## 2020-07-09 LAB — CBC
HCT: 28.1 % — ABNORMAL LOW (ref 39.0–52.0)
Hemoglobin: 9.3 g/dL — ABNORMAL LOW (ref 13.0–17.0)
MCH: 30.8 pg (ref 26.0–34.0)
MCHC: 33.1 g/dL (ref 30.0–36.0)
MCV: 93 fL (ref 80.0–100.0)
Platelets: 473 10*3/uL — ABNORMAL HIGH (ref 150–400)
RBC: 3.02 MIL/uL — ABNORMAL LOW (ref 4.22–5.81)
RDW: 14.1 % (ref 11.5–15.5)
WBC: 15.1 10*3/uL — ABNORMAL HIGH (ref 4.0–10.5)
nRBC: 0 % (ref 0.0–0.2)

## 2020-07-09 LAB — BASIC METABOLIC PANEL
Anion gap: 8 (ref 5–15)
BUN: 14 mg/dL (ref 8–23)
CO2: 25 mmol/L (ref 22–32)
Calcium: 7.6 mg/dL — ABNORMAL LOW (ref 8.9–10.3)
Chloride: 104 mmol/L (ref 98–111)
Creatinine, Ser: 0.9 mg/dL (ref 0.61–1.24)
GFR, Estimated: >60 mL/min (ref >60)
Glucose, Bld: 110 mg/dL — ABNORMAL HIGH (ref 70–99)
Potassium: 3.7 mmol/L (ref 3.5–5.1)
Sodium: 137 mmol/L (ref 135–145)

## 2020-07-09 LAB — PROCALCITONIN: Procalcitonin: 0.14 ng/mL

## 2020-07-09 MED ORDER — SODIUM CHLORIDE 0.9 % IV SOLN: 3.0000 g | Freq: Four times a day (QID) | INTRAVENOUS | Status: DC

## 2020-07-09 MED ORDER — ACETAMINOPHEN 325 MG PO TABS: 650.0000 mg | ORAL_TABLET | Freq: Four times a day (QID) | ORAL | Status: DC | PRN

## 2020-07-09 MED ORDER — GUAIFENESIN ER 600 MG PO TB12: 1200.0000 mg | ORAL_TABLET | Freq: Two times a day (BID) | ORAL | Status: DC

## 2020-07-09 MED ORDER — ACETAMINOPHEN 650 MG RE SUPP: 650.0000 mg | Freq: Four times a day (QID) | RECTAL | Status: DC | PRN

## 2020-07-09 MED ORDER — CALCIUM CARBONATE 1250 (500 CA) MG PO TABS
1.0000 | ORAL_TABLET | Freq: Three times a day (TID) | ORAL | Status: DC
  Administered 2020-07-10 – 2020-07-15 (×17): 500 mg via ORAL
  Filled 2020-07-09 (×17): qty 1

## 2020-07-09 MED ORDER — NICOTINE 14 MG/24HR TD PT24
14.0000 mg | MEDICATED_PATCH | Freq: Every day | TRANSDERMAL | Status: DC
  Administered 2020-07-10 – 2020-07-15 (×6): 14 mg via TRANSDERMAL
  Filled 2020-07-09 (×7): qty 1

## 2020-07-09 MED ORDER — GUAIFENESIN ER 600 MG PO TB12
1200.0000 mg | ORAL_TABLET | Freq: Two times a day (BID) | ORAL | Status: DC
  Administered 2020-07-10 – 2020-07-14 (×9): 1200 mg via ORAL
  Filled 2020-07-09 (×10): qty 2

## 2020-07-09 MED ORDER — ENSURE ENLIVE PO LIQD
237.0000 mL | Freq: Three times a day (TID) | ORAL | Status: DC
  Administered 2020-07-09: 13:00:00 237 mL via ORAL

## 2020-07-09 MED ORDER — NICOTINE 14 MG/24HR TD PT24: 14.0000 mg | MEDICATED_PATCH | Freq: Every day | TRANSDERMAL | 0 refills | Status: DC

## 2020-07-09 MED ORDER — SODIUM CHLORIDE 0.9 % IV SOLN
3.0000 g | Freq: Four times a day (QID) | INTRAVENOUS | Status: DC
  Administered 2020-07-10 – 2020-07-14 (×19): 3 g via INTRAVENOUS
  Filled 2020-07-09 (×3): qty 3
  Filled 2020-07-09 (×2): qty 8
  Filled 2020-07-09 (×2): qty 3
  Filled 2020-07-09: qty 8
  Filled 2020-07-09 (×2): qty 3
  Filled 2020-07-09: qty 8
  Filled 2020-07-09 (×3): qty 3
  Filled 2020-07-09 (×2): qty 8
  Filled 2020-07-09 (×4): qty 3
  Filled 2020-07-09: qty 8
  Filled 2020-07-09: qty 3

## 2020-07-09 NOTE — H&P (Signed)
History and Physical    Luis Haney PNT:[id number] DOB: 1951/06/29 DOA: 07/09/2020  PCP: [facility]  Patient coming from: Patient was transferred from [facility].  Chief Complaint: Right-sided chest pain.  HPI: Luis Haney is a 69 y.o. male with history of tobacco abuse and COPD was admitted on June 28, 2020 at [facility] and was found to have right-sided loculated effusion following thoracentesis was compatible with empyema and the chest tube was placed.  Patient's cultures grew Streptococcus intermedius sensitive to Unasyn and antibiotics were changed to Unasyn.  Since patient may require a larger tube and streptokinase versus decortication surgery patient was transferred to [facility] for cardiothoracic surgery evaluation.  Last labs done on July 08, 2020 showed procalcitonin of 0.14 WBC of 15.1 hemoglobin 9.3 platelets 473.  Patient on my exam is not in distress.  ED Course: Patient was a direct transfer from [facility].  Review of Systems: As per HPI, rest all negative.   History reviewed. No pertinent past medical history.  Past Surgical History:  Procedure Laterality Date  . BACK SURGERY  2000  . NECK SURGERY  2012     reports that he has been smoking cigarettes. He has been smoking about 0.25 packs per day. He has never used smokeless tobacco. He reports current alcohol use. He reports previous drug use.  No Known Allergies  Family History  Problem Relation Age of Onset  . Heart disease Mother   . Other Father        "old age"    Prior to Admission medications   Medication Sig Start Date End Date Taking? Authorizing Provider  Ampicillin-Sulbactam 3 g in sodium chloride 0.9 % 100 mL Inject 3 g into the vein every 6 (six) hours. 07/09/20   Antonieta Pert, MD  calcium carbonate (OS-CAL - DOSED IN MG OF ELEMENTAL CALCIUM) 1250 (500 Ca) MG tablet Take 1 tablet by mouth.     [provider]  guaiFENesin (MUCINEX) 600 MG 12 hr tablet Take 2 tablets (1,200 mg total) by mouth 2 (two) times daily. 07/09/20   Antonieta Pert, MD  Multiple Vitamin (MULTI VITAMIN MENS PO) Take by mouth.    [provider]  nicotine (NICODERM CQ - DOSED IN MG/24 HOURS) 14 mg/24hr patch Place 1 patch (14 mg total) onto the skin daily. 07/10/20   Antonieta Pert, MD  Potassium 99 MG TABS Take 99 mg by mouth daily.    [provider]    Physical Exam: Constitutional: Moderately built and nourished. Vitals:   07/09/20 2211  BP: 134/69  Pulse: 79  Resp: 16  Temp: 98.7 F (37.1 C)  TempSrc: Oral  SpO2: 96%  Weight: 92.2 kg  Height: 6\' 1"  (1.854 m)   Eyes: Anicteric no pallor. ENMT: No discharge from the ears eyes nose or mouth. Neck: No mass felt.  No neck rigidity. Respiratory: No rhonchi or crepitations. Cardiovascular: S1-S2 heard. Abdomen: Soft nontender bowel sounds present. Musculoskeletal: No edema. Skin: No rash. Neurologic: Alert awake oriented to time place and person.  Moves all extremities. Psychiatric: Appears normal.  Normal affect.   Labs on Admission: I have personally reviewed following labs and imaging studies  CBC: Recent Labs  Lab 07/05/20 0439 07/06/20 0750 07/07/20 0524 07/08/20 0552 07/09/20 0503  WBC 20.9* 20.1* 18.8* 15.3* 15.1*  HGB 9.7* 10.2* 10.6* 9.7* 9.3*  HCT 29.2* 30.1* 31.4* 28.5* 28.1*  MCV 92.1 91.8 91.8 92.2 93.0  PLT 423* 431* 479* 458* 865*   Basic Metabolic Panel: Recent Labs  Lab 07/05/20 0439 07/06/20 0750 07/07/20 0524 07/08/20 0552 07/09/20 0503  NA 137 136 138 138 137  K 3.8 3.7 3.7 3.7 3.7  CL 100 100 101 104 104  CO2 30 26 27 25 25   GLUCOSE 110* 109* 109* 96 110*  BUN 18 16 15 15 14   CREATININE 1.00 0.92 1.00 0.92 0.90  CALCIUM 7.3* 7.4* 7.7* 7.5* 7.6*   GFR: Estimated Creatinine Clearance: 85.1 mL/min (by C-G formula based on SCr of 0.9 mg/dL). Liver Function Tests: No results for  input(s): AST, ALT, ALKPHOS, BILITOT, PROT, ALBUMIN in the last 168 hours. No results for input(s): LIPASE, AMYLASE in the last 168 hours. No results for input(s): AMMONIA in the last 168 hours. Coagulation Profile: No results for input(s): INR, PROTIME in the last 168 hours. Cardiac Enzymes: No results for input(s): CKTOTAL, CKMB, CKMBINDEX, TROPONINI in the last 168 hours. BNP (last 3 results) No results for input(s): PROBNP in the last 8760 hours. HbA1C: No results for input(s): HGBA1C in the last 72 hours. CBG: No results for input(s): GLUCAP in the last 168 hours. Lipid Profile: No results for input(s): CHOL, HDL, LDLCALC, TRIG, CHOLHDL, LDLDIRECT in the last 72 hours. Thyroid Function Tests: No results for input(s): TSH, T4TOTAL, FREET4, T3FREE, THYROIDAB in the last 72 hours. Anemia Panel: No results for input(s): VITAMINB12, FOLATE, FERRITIN, TIBC, IRON, RETICCTPCT in the last 72 hours. Urine analysis:    Component Value Date/Time   COLORURINE YELLOW (A) 06/28/2020 0936   APPEARANCEUR HAZY (A) 06/28/2020 0936   LABSPEC 1.017 06/28/2020 0936   PHURINE 5.0 06/28/2020 0936   GLUCOSEU NEGATIVE 06/28/2020 0936   HGBUR MODERATE (A) 06/28/2020 0936   BILIRUBINUR NEGATIVE 06/28/2020 0936   KETONESUR NEGATIVE 06/28/2020 0936   PROTEINUR NEGATIVE 06/28/2020 0936   NITRITE NEGATIVE 06/28/2020 0936   LEUKOCYTESUR NEGATIVE 06/28/2020 0936   Sepsis Labs: @LABRCNTIP (procalcitonin:4,lacticidven:4) )No results found for this or any previous visit (from the past 240 hour(s)).   Radiological Exams on Admission: DG Chest Port 1 View  Result Date: 07/09/2020 CLINICAL DATA:  Follow-up right-sided empyema EXAM: PORTABLE CHEST 1 VIEW COMPARISON:  06/29/2020, 07/04/2020 CT of the chest FINDINGS: Persistent small right pleural collection is noted consistent with the given clinical history of empyema. Pigtail catheter remains in place on the right. Fluid is noted within the minor fissure as  well. No pneumothorax is seen. Left lung remains clear. Cardiac shadow is stable. IMPRESSION: Stable right pleural collection with drain in place. No new focal abnormality is seen. Electronically Signed   By: Inez Catalina M.D.   On: 07/09/2020 08:08      Assessment/Plan Principal Problem:   Loculated pleural effusion Active Problems:   Acute hypoxemic respiratory failure (HCC)   Mass of upper lobe of right lung   Tobacco use disorder, moderate, dependence   Empyema lung ([place])    1. Right-sided empyema status post chest tube placement at [facility] cultures grew Streptococcus intermedius sensitive to Unasyn and presently on Unasyn transferred to [facility] for possible need for larger chest tube with streptokinase versus decortication surgery for which cardiothoracic surgery evaluation has been requested.  Dr. Servando Snare of cardiothoracic surgeon was notified as per the transfer note.  We will repeat labs continue Unasyn per pharmacy check chest x-ray. 2. Spiculated right apical lung lesion will need follow-up. 3. COPD not actively wheezing. 4. Chronic anemia follow CBC hemoglobin appears to be  stable.  Check anemia panel with next blood draw. 5. Tobacco abuse on NicoDerm patch.  Advised about quitting.  Since patient has empyema will need further close monitoring and procedures and inpatient status.   DVT prophylaxis: SCDs for now in anticipation of procedure. Code Status: Full code. Family Communication: Discussed with patient. Disposition Plan: To be determined. Consults called: Dr. Servando Snare of cardiothoracic surgery at [facility] was notified. Admission status: Inpatient.   Rise Patience MD [facility] Pager [phone].  If 7PM-7AM, please contact night-coverage [email or web address] Password [facility]  07/09/2020, 11:34 PM

## 2020-07-09 NOTE — Progress Notes (Signed)
Mobility Specialist - Progress Note   07/09/20 1411  Mobility  Activity  (Seated exercises)  Level of Assistance Standby assist, set-up cues, supervision of patient - no hands on  Assistive Device None  Mobility Response Tolerated well  Mobility performed by Mobility specialist  $Mobility charge 1 Mobility    Pt seated on recliner upon arrival. Pt agreed to session. Pt performed seated exercises: ankle pumps x 20, kicks x 20, and marches x 20. Pt declined further mobility. Overall, pt tolerated session well. Pt had lengthy discussion w/ mobility specialist about his desire to go home. Mobility specialist informed pt dc will be ordered when dr believes it's safe and appropriate. Encouraged to participate in mobility more frequently. Pt shows understanding. Pt left sitting on recliner w/ all needs placed in reach. Nurse notified via secure chat.     Javiana Anwar Mobility Specialist  07/09/20, 2:17 PM

## 2020-07-09 NOTE — Plan of Care (Signed)
Continuing with plan of care. 

## 2020-07-09 NOTE — Discharge Summary (Signed)
Physician Discharge Summary  Luis Haney OHY:[id number] DOB: 1950/11/24 DOA: 06/28/2020  PCP: [name]  Admit date: 06/28/2020 Discharge date: 07/10/2020  Admitted From: [facility] Disposition:  [place]  Recommendations for Outpatient Follow-up:    Discharge Condition: Stable Code Status:   Code Status: Full Code Diet recommendation:  Diet Order    None     Brief/Interim Summary: 69 year old male with history of left lower back pain, right rotator cuff tendinitis, COPD, erectile dysfunction presented to the ED with right-sided upper back pain and shortness of breath. He is a current smoker and having increasing his cough but no fever chills. Patient had CT chest showing right lower lobe infiltrate pneumonia along with a loculated effusion status post chest tube placement by IR initial lab with exudative fluid also found to have a spiculated 10 mm right upper lobe nodule. Pleural fluid grew strep intermedius x 2- has been on ancef and  chest tube drainage.He is followed by Pulm. Pulm today 12/17 recommended transfer to [facility] for CT surgeyr eval to consider TPA/DN intrapleurally vs decortication and possibly place another pigtail in focus of empyema that is undrained at oblique fissure. He was subsequently transferred to [facility] for CT surgery evaluation.  Discharge Diagnoses:   Severe sepsis with pneumonia/empyema/Pleural effusion, right: Continues to have drainage 93 mL past 24 hours.  Pulmonary and IR following.CT chest 12/9 shows decreased size of parapneumonic effusion and repeat 12/12 showed stability in complex loculated thick-walled hydropneumothorax.  Culture growing strep intermedius in both pleural fluid and pansensitive- was on Vanco cefepime and has been changed to Unasyn 12/7.Leukocytosis stable at 15.1k.Discussed Dr. Raul Del may need Bigger tube with streptokinase vs ct surgery eval. Pulm today 12/17 recommended transfer to [facility] for CT  surgeyr eval to consider TPA/DN intrapleurally vs decortication and possibly place another pigtail in focus of empyema that is undrained at oblique fissure.  Acute hypoxic respiratory failure:resolved.  AKI due to dehydration resolved.  COPD not in exacerbation continue Pen nebs.  Tobacco abuse- cessation advised.He smokes 1 to 1/2 pack/day.On nicotine patch.    Spiculated pulmonary nodule:?Malignancy-pulm aware:Dr Raul Del will follow:PET scan as recommended by radiology once acute pneumonia resolves.Cytology was negative for malignancy. I discussed with the patient about his lung nodules and need for follow up  Consults:  PULM,IR  Subjective: Alert awake oriented resting comfortably.  No complaint.  Agreeable diet he will probably need to stay in the hospital for longer and has agreed for transfer to [facility].  Discharge Exam: Vitals:   07/09/20 2051 07/09/20 2116  BP: 118/67 118/65  Pulse: 84 79  Resp: 18 19  Temp: 98.3 F (36.8 C) 98.1 F (36.7 C)  SpO2: 97% 96%   General: Pt is alert, awake, not in acute distress Cardiovascular: RRR, S1/S2 +, no rubs, no gallops Respiratory: CTA bilaterally, no wheezing, no rhonchi Abdominal: Soft, NT, ND, bowel sounds + Extremities: no edema, no cyanosis  Discharge Instructions   Allergies as of 07/09/2020   No Known Allergies     Medication List    TAKE these medications   Ampicillin-Sulbactam 3 g in sodium chloride 0.9 % 100 mL Inject 3 g into the vein every 6 (six) hours.   calcium carbonate 1250 (500 Ca) MG tablet Commonly known as: OS-CAL - dosed in mg of elemental calcium Take 1 tablet by mouth.   guaiFENesin 600 MG 12 hr tablet Commonly known as: MUCINEX Take 2 tablets (1,200 mg total) by mouth 2 (two) times  daily.   MULTI VITAMIN MENS PO Take by mouth.   nicotine 14 mg/24hr patch Commonly known as: NICODERM CQ - dosed in mg/24 hours Place 1 patch (14 mg total) onto the skin daily.   Potassium 99 MG  Tabs Take 99 mg by mouth daily.       No Known Allergies  The results of significant diagnostics from this hospitalization (including imaging, microbiology, ancillary and laboratory) are listed below for reference.    Microbiology: No results found for this or any previous visit (from the past 240 hour(s)).  Procedures/Studies: CT CHEST WO CONTRAST  Result Date: 07/04/2020 CLINICAL DATA:  Chest tube placement 06/28/2020, empyema, persistent cough EXAM: CT CHEST WITHOUT CONTRAST TECHNIQUE: Multidetector CT imaging of the chest was performed following the standard protocol without IV contrast. COMPARISON:  CT 07/01/2020 FINDINGS: Cardiovascular: Limited assessment in the absence of contrast media. Cardiac size within normal limits. Three-vessel coronary artery atherosclerosis is present. Trace pericardial effusion. Atherosclerotic plaque within the normal caliber aorta. No periaortic stranding or hemorrhage. No hyperdense mural thickening or plaque displacement. Shared origin of the brachiocephalic and left common carotid arteries. Minimal plaque in the proximal great vessels. Central pulmonary arteries are normal caliber. No major venous abnormalities. Mediastinum/Nodes: Persistent scattered subcentimeter low-attenuation mediastinal and hilar nodes, the latter of which are difficult to fully assess in the absence of contrast media. No new enlarged or enlarging nodes. No axillary adenopathy. Scattered secretions again seen in the trachea. Some mild central airways thickening is noted as well. No acute abnormality of the esophagus. Thyroid gland and thoracic inlet are unremarkable. Lungs/Pleura: A right pigtail pleural drain is in place, entering along the anterior axillary line at the third interspace, terminating within a heterogeneous, thick-walled hydropneumothorax. Loculated components are again seen extending into the minor fissure and to a lesser extent the major fissure as well. Redemonstration  of a spiculated nodule towards the right lung apex measuring up to 14 mm in size (3/26). An additional cavitary nodule is seen in the medial right lower lobe (3/73) measuring approximately 1.8 x 1.4 cm, increased from 1.6 x 0.0 cm on prior. Background of emphysematous changes. Small bland left pleural effusion. No left pneumothorax. Upper Abdomen: Stable mild symmetric bilateral perinephric stranding, a nonspecific finding which may correlate with advanced age or decreased renal function. No acute abnormalities present in the visualized portions of the upper abdomen. Musculoskeletal: Exaggerated thoracic kyphosis and mild levocurvature of the thoracic spine. Cervical fusion, incompletely assessed. No acute osseous abnormality or suspicious osseous lesion. No suspicious chest wall masses or lesions. IMPRESSION: 1. Right pigtail pleural drain in place, terminating within a complex, thick-walled hydropneumothorax. Loculated components are again seen extending into the minor fissure and to a lesser extent the major fissure as well. Overall size and extent of this collection is not significantly changed from the comparison. 2. Scattered secretions seen within the airways. 3. Slight interval increase in size of the cavitary nodule with layering air-fluid level in the medial right lower lobe. Could reflect intrapulmonary abscess or cavitary nodule. 4. Stable spiculated nodule towards the right lung apex measuring up to 14 mm in size. 5. Small left pleural effusion and atelectasis. 6. Aortic Atherosclerosis (ICD10-I70.0) 7. Emphysema (ICD10-J43.9). Electronically Signed   By: Lovena Le M.D.   On: 07/04/2020 19:51   CT CHEST WO CONTRAST  Result Date: 07/01/2020 CLINICAL DATA:  Empyema EXAM: CT CHEST WITHOUT CONTRAST TECHNIQUE: Multidetector CT imaging of the chest was performed following the standard protocol without IV contrast.  COMPARISON:  CT dated June 28, 2020 FINDINGS: Cardiovascular: The heart size is  stable. Aortic calcifications are noted without evidence for an aneurysm. There are coronary artery calcifications. There is a trace pericardial effusion. Mediastinum/Nodes: --mildly enlarged mediastinal lymph nodes are noted --there are enlarged right hilar lymph nodes -- No axillary lymphadenopathy. -- No supraclavicular lymphadenopathy. -- Normal thyroid gland where visualized. -  Unremarkable esophagus. Lungs/Pleura: The previously demonstrated right-sided loculated pleural effusion has significantly improved status post right-sided chest tube placement. There remains a moderate-sized loculated right-sided hydropneumothorax. There is a spiculated pulmonary nodule at the right lung apex measuring approximately 1.4 cm (axial series 3, image 32). Emphysematous changes are noted. there is some nodularity along the fissures on the right which is felt to represent trapped fluid as opposed to true pulmonary nodules. There is debris within the trachea. The left lung field is mostly clear aside from a small left-sided pleural effusion with adjacent atelectasis. Upper Abdomen: No acute abnormality identified in the upper abdomen. Musculoskeletal: No chest wall abnormality. No bony spinal canal stenosis. IMPRESSION: 1. Significantly improved appearance of the previously demonstrated right-sided loculated pleural effusion status post right-sided chest tube placement. There remains a moderate-sized loculated right-sided hydropneumothorax. 2. Spiculated 1.4 cm pulmonary nodule at the right lung apex concerning for bronchogenic carcinoma. Pulmonary medicine follow-up is recommended. 3. Enlarged mediastinal and hilar lymph nodes. While these may be reactive, in the setting of a spiculated right upper lobe pulmonary nodule, nodal metastatic disease is not excluded. 4. Small left-sided pleural effusion with adjacent atelectasis. Aortic Atherosclerosis (ICD10-I70.0) and Emphysema (ICD10-J43.9). Electronically Signed   By:  Constance Holster M.D.   On: 07/01/2020 19:39   CT CHEST WO CONTRAST  Result Date: 06/28/2020 CLINICAL DATA:  Abnormal chest x-ray, large effusion EXAM: CT CHEST WITHOUT CONTRAST TECHNIQUE: Multidetector CT imaging of the chest was performed following the standard protocol without IV contrast. COMPARISON:  Chest x-ray earlier today FINDINGS: Cardiovascular: Calcifications in the aorta and coronary arteries. Heart is normal size. Aorta is normal caliber. Mediastinum/Nodes: No mediastinal, hilar, or axillary adenopathy. Trachea and esophagus are unremarkable. Thyroid unremarkable. Lungs/Pleura: Large right pleural effusion loculated laterally. Airspace disease throughout the right lung could reflect compressive atelectasis or pneumonia. This is most confluent in the right lower lobe. Spiculated appearing nodule in the aerated right upper lobe on image 30 measures 10 mm. No confluent opacity or effusion on the left. Upper Abdomen: Imaging into the upper abdomen demonstrates no acute findings. Musculoskeletal: Chest wall soft tissues are unremarkable. No acute bony abnormality. IMPRESSION: Large loculated right pleural effusion with compressive atelectasis versus infiltrate/pneumonia in the right lung, particularly right lower lobe. Spiculated 10 mm right upper lobe nodule. This warrants further investigation. Consider PET CT after clearance of acute symptoms for further evaluation. Coronary artery disease. Aortic Atherosclerosis (ICD10-I70.0). Electronically Signed   By: Rolm Baptise M.D.   On: 06/28/2020 11:47   DG CHEST PORT 1 VIEW  Result Date: 07/10/2020 CLINICAL DATA:  Pleural effusion. EXAM: PORTABLE CHEST 1 VIEW COMPARISON:  Chest radiograph December 17, 21. FINDINGS: Similar versus slightly increased small right pleural effusion with similar positioning of a right-sided pigtail catheter. Redemonstrated fluid within the right major fissure. Similar scattered opacities in the right lung. Nodules better  characterized on prior CT chest. No visible pneumothorax. Similar cardiomediastinal silhouette. IMPRESSION: Similar versus slightly increased right pleural effusion with similar position of the pleural drain. Electronically Signed   By: Margaretha Sheffield MD   On: 07/10/2020 08:05  DG Chest Port 1 View  Result Date: 07/09/2020 CLINICAL DATA:  Follow-up right-sided empyema EXAM: PORTABLE CHEST 1 VIEW COMPARISON:  06/29/2020, 07/04/2020 CT of the chest FINDINGS: Persistent small right pleural collection is noted consistent with the given clinical history of empyema. Pigtail catheter remains in place on the right. Fluid is noted within the minor fissure as well. No pneumothorax is seen. Left lung remains clear. Cardiac shadow is stable. IMPRESSION: Stable right pleural collection with drain in place. No new focal abnormality is seen. Electronically Signed   By: Inez Catalina M.D.   On: 07/09/2020 08:08   DG Chest Port 1 View  Result Date: 06/29/2020 CLINICAL DATA:  Shortness of breath.  Right chest tube. EXAM: PORTABLE CHEST 1 VIEW COMPARISON:  Chest radiograph and chest CT 06/28/2020. Chest radiograph 07/30/2018 FINDINGS: Pigtail drain has been placed in the right chest. Markedly improved aeration in the right lung compatible with removal of pleural fluid. There is a rounded opacity in the right lower chest that measures up to 5.0 cm. Residual densities along the periphery of the right lower chest most likely represents pleural-based densities. Negative for right pneumothorax. Left lung is clear without pneumothorax. Heart size is within normal limits and stable. The trachea is midline. Surgical hardware in the lower cervical spine. IMPRESSION: 1. Markedly improved aeration in the right lung following placement of the right chest tube. Negative for pneumothorax. 2. Large rounded opacity in the right lower chest measuring at least 5.0 cm in diameter. This rounded opacity may represent a focus of loculated  pleural fluid but a mass lesion cannot be excluded although there was no gross abnormality in this area on the study from January 2020. Recommend continued follow-up and patient will likely need follow-up chest CT. Follow-up chest CT with IV contrast would be ideal but probably not feasible due to patient's renal function. Electronically Signed   By: Markus Daft M.D.   On: 06/29/2020 09:33   DG Chest Port 1 View  Result Date: 06/28/2020 CLINICAL DATA:  Right pleural effusion. EXAM: PORTABLE CHEST 1 VIEW COMPARISON:  Same day. FINDINGS: Stable cardiomediastinal silhouette. Interval placement of pigtail drainage catheter into right hemithorax. Right pleural effusion is significantly decreased as a result. Moderate right hydropneumothorax remains. Left lung is clear. Bony thorax is unremarkable. IMPRESSION: Interval placement of pigtail drainage catheter into right hemithorax. Right pleural effusion is significantly decreased as a result. Moderate right hydropneumothorax remains. Electronically Signed   By: Marijo Conception M.D.   On: 06/28/2020 17:59   DG Chest Port 1 View  Result Date: 06/28/2020 CLINICAL DATA:  Cough, right-sided back pain EXAM: PORTABLE CHEST 1 VIEW COMPARISON:  January 2020 FINDINGS: New large right pleural effusion with associated atelectasis. Left lung is clear. No pneumothorax. Heart size is normal. Mild leftward mediastinal shift. Cervicothoracic fusion. IMPRESSION: New large right pleural effusion with associated atelectasis. Underlying pneumonia is not excluded. Electronically Signed   By: Macy Mis M.D.   On: 06/28/2020 10:09   CT IMAGE GUIDED DRAINAGE BY PERCUTANEOUS CATHETER  Result Date: 06/28/2020 INDICATION: 69 year old male with large loculated right-sided pleural effusion and evidence of bacteremia. Findings are concerning for parapneumonic effusion. EXAM: CT-guided chest tube placement MEDICATIONS: The patient is currently admitted to the hospital and receiving  intravenous antibiotics. The antibiotics were administered within an appropriate time frame prior to the initiation of the procedure. ANESTHESIA/SEDATION: Fentanyl 50 mcg IV; Versed 1 mg IV Moderate Sedation Time:  10 minutes The patient was continuously monitored during  the procedure by the interventional radiology nurse under my direct supervision. COMPLICATIONS: None immediate. PROCEDURE: Informed written consent was obtained from the patient after a thorough discussion of the procedural risks, benefits and alternatives. All questions were addressed. Maximal Sterile Barrier Technique was utilized including caps, mask, sterile gowns, sterile gloves, sterile drape, hand hygiene and skin antiseptic. A timeout was performed prior to the initiation of the procedure. A planning axial CT scan was performed. The large loculated right-sided pleural effusion was identified. A suitable skin entry site and an anterior intercostal space was identified. The skin was marked. After sterile prep and drape using chlorhexidine skin prep, local anesthesia was attained by infiltration with 1% lidocaine. A small dermatotomy was made. Using trocar technique, a Cook 32 [place] all-purpose drainage catheter was advanced over the rib and into the pleural space. The catheter was connected to low wall suction via a Serra pleura vac device. Nearly 2 L of pleural fluid was successfully aspirated in the first few minutes. Follow-up CT imaging demonstrates a well-positioned chest tube. No immediate complication. Tube was secured to the skin with 0 Prolene suture. An air tight sterile dressing was applied. IMPRESSION: Successful placement of a 16 French chest tube into the loculated right pleural effusion. Evacuation yields cloudy yellow fluid. Samples were sent for Gram stain and culture. Electronically Signed   By: Jacqulynn Cadet M.D.   On: 06/28/2020 15:14    Labs: BNP (last 3 results) No results for input(s): BNP in the last 8760  hours. Basic Metabolic Panel: Recent Labs  Lab 07/06/20 0750 07/07/20 0524 07/08/20 0552 07/09/20 0503 07/10/20 0338  NA 136 138 138 137 137  K 3.7 3.7 3.7 3.7 3.7  CL 100 101 104 104 102  CO2 26 27 25 25 24   GLUCOSE 109* 109* 96 110* 103*  BUN 16 15 15 14 12   CREATININE 0.92 1.00 0.92 0.90 1.01  CALCIUM 7.4* 7.7* 7.5* 7.6* 7.8*   Liver Function Tests: Recent Labs  Lab 07/10/20 0338  AST 25  ALT 23  ALKPHOS 37*  BILITOT 0.8  PROT 5.1*  ALBUMIN 1.7*   No results for input(s): LIPASE, AMYLASE in the last 168 hours. No results for input(s): AMMONIA in the last 168 hours. CBC: Recent Labs  Lab 07/06/20 0750 07/07/20 0524 07/08/20 0552 07/09/20 0503 07/10/20 0338  WBC 20.1* 18.8* 15.3* 15.1* 13.8*  NEUTROABS  --   --   --   --  11.0*  HGB 10.2* 10.6* 9.7* 9.3* 9.0*  HCT 30.1* 31.4* 28.5* 28.1* 28.2*  MCV 91.8 91.8 92.2 93.0 93.7  PLT 431* 479* 458* 473* 469*   Cardiac Enzymes: No results for input(s): CKTOTAL, CKMB, CKMBINDEX, TROPONINI in the last 168 hours. BNP: Invalid input(s): POCBNP CBG: No results for input(s): GLUCAP in the last 168 hours. D-Dimer No results for input(s): DDIMER in the last 72 hours. Hgb A1c No results for input(s): HGBA1C in the last 72 hours. Lipid Profile No results for input(s): CHOL, HDL, LDLCALC, TRIG, CHOLHDL, LDLDIRECT in the last 72 hours. Thyroid function studies No results for input(s): TSH, T4TOTAL, T3FREE, THYROIDAB in the last 72 hours.  Invalid input(s): FREET3 Anemia work up No results for input(s): VITAMINB12, FOLATE, FERRITIN, TIBC, IRON, RETICCTPCT in the last 72 hours. Urinalysis    Component Value Date/Time   COLORURINE YELLOW (A) 06/28/2020 0936   APPEARANCEUR HAZY (A) 06/28/2020 0936   LABSPEC 1.017 06/28/2020 0936   PHURINE 5.0 06/28/2020 0936   GLUCOSEU NEGATIVE 06/28/2020 9379  HGBUR MODERATE (A) 06/28/2020 0936   BILIRUBINUR NEGATIVE 06/28/2020 0936   KETONESUR NEGATIVE 06/28/2020 0936    PROTEINUR NEGATIVE 06/28/2020 0936   NITRITE NEGATIVE 06/28/2020 0936   LEUKOCYTESUR NEGATIVE 06/28/2020 0936   Sepsis Labs Invalid input(s): PROCALCITONIN,  WBC,  LACTICIDVEN Microbiology No results found for this or any previous visit (from the past 240 hour(s)).   Time coordinating discharge: 35 minutes  SIGNED: Antonieta Pert, MD  [facility] 07/10/2020, 1:31 PM  If 7PM-7AM, please contact night-coverage [email or web address]

## 2020-07-09 NOTE — Progress Notes (Signed)
Initial Nutrition Assessment  DOCUMENTATION CODES:   Non-severe (moderate) malnutrition in context of chronic illness  INTERVENTION:   Ensure Enlive po TID, each supplement provides 350 kcal and 20 grams of protein  MVI daily   NUTRITION DIAGNOSIS:   Moderate Malnutrition related to chronic illness (COPD) as evidenced by mild to moderate fat depletions, moderate to severe muscle depletions.   GOAL:   Patient will meet greater than or equal to 90% of their needs  MONITOR:   PO intake,Supplement acceptance,Labs,Weight trends,Skin,I & O's  REASON FOR ASSESSMENT:   LOS    ASSESSMENT:   69 year old male with history of left lower back pain, right rotator cuff tendinitis, COPD, erectile dysfunction presented to the ED with right-sided upper back pain and shortness of breath. Pt found to have severe sepsis with right pneumonia/empyema/pleural effusion s/p IR chest tube 12/6   Met with pt in room today. Pt reports fair appetite and oral intake pta and in hospital. Pt reports that at baseline, he does not generally eat breakfast. Pt generally drinks 2 cups of coffee for breakfast and then eats a late lunch. Pt enjoys breakfast foods for lunch and dinner. Occasionally, pt reports that he will go out for a late breakfast and not eat anything else for the rest of the day. Pt reports that he does not drink any supplements at baseline. RD discussed with pt the importance of adequate nutrition needed to preserve lean muscle. Pt is currently documented to be eating 50-100% of meals in hospital. Pt reports that he is willing to drink strawberry Ensure in hospital. RD will add supplements and MVI to help pt meet his estimated needs. Per chart, pt appears weight stable pta. Pt is also weight stable since admit.   Medications reviewed and include: lovenox, nicotine, unasyn  Labs reviewed: wbc- 15.1(H), Hgb 9.3(L), Hct 28.1(L)  NUTRITION - FOCUSED PHYSICAL EXAM:  Flowsheet Row Most Recent Value   Orbital Region Mild depletion  Upper Arm Region Severe depletion  Thoracic and Lumbar Region Moderate depletion  Buccal Region Mild depletion  Temple Region Severe depletion  Clavicle Bone Region Severe depletion  Clavicle and Acromion Bone Region Severe depletion  Scapular Bone Region Moderate depletion  Dorsal Hand Moderate depletion  Patellar Region Moderate depletion  Anterior Thigh Region Moderate depletion  Posterior Calf Region Moderate depletion  Edema (RD Assessment) None  Hair Reviewed  Eyes Reviewed  Mouth Reviewed  Skin Reviewed  Nails Reviewed     Diet Order:   Diet Order            Diet regular Room service appropriate? Yes; Fluid consistency: Thin  Diet effective now                EDUCATION NEEDS:   Education needs have been addressed  Skin:  Skin Assessment: Reviewed RN Assessment (incision chest)  Last BM:  12/16  Height:   Ht Readings from Last 1 Encounters:  06/28/20 6' 1"  (1.854 m)    Weight:   Wt Readings from Last 1 Encounters:  07/09/20 93.9 kg    Ideal Body Weight:  83.6 kg  BMI:  Body mass index is 27.32 kg/m.  Estimated Nutritional Needs:   Kcal:  2300-2600kcal/day  Protein:  115-130g/day  Fluid:  2.1-2.4L/day  Koleen Distance MS, RD, LDN Please refer to [facility] for RD and/or RD on-call/weekend/after hours pager

## 2020-07-09 NOTE — Progress Notes (Signed)
Date: 07/09/2020,   MRN# [id number] Luis Haney 1951/05/24 Code Status:     Code Status Orders  (From admission, onward)         Start     Ordered   06/28/20 1352  Full code  Continuous        06/28/20 1353        Code Status History    This patient has a current code status but no historical code status.   Advance Care Planning Activity       Hosp day:@LENGTHOFSTAYDAYS @ Referring MD: @ATDPROV @     PCP:      CC:   HPI:   PMHX:   History reviewed. No pertinent past medical history. Surgical Hx:  Past Surgical History:  Procedure Laterality Date  . BACK SURGERY  2000  . NECK SURGERY  2012   Family Hx:  Family History  Problem Relation Age of Onset  . Heart disease Mother   . Other Father        "old age"   Social Hx:   Social History   Tobacco Use  . Smoking status: Current Every Day Smoker    Packs/day: 0.25    Types: Cigarettes  . Smokeless tobacco: Never Used  Vaping Use  . Vaping Use: Never used  Substance Use Topics  . Alcohol use: Yes    Comment: beer occasionally  . Drug use: Not Currently   Medication:    Home Medication:    Current Medication: @CURMEDTAB @   Allergies:  Patient has no known allergies.  Review of Systems: Gen:  Denies  fever, sweats, chills HEENT: Denies blurred vision, double vision, ear pain, eye pain, hearing loss, nose bleeds, sore throat Cvc:  No dizziness, chest pain or heaviness Resp:    Gi: Denies swallowing difficulty, stomach pain, nausea or vomiting, diarrhea, constipation, bowel incontinence Gu:  Denies bladder incontinence, burning urine Ext:   No Joint pain, stiffness or swelling Skin: No skin rash, easy bruising or bleeding or hives Endoc:  No polyuria, polydipsia , polyphagia or weight change Psych: No depression, insomnia or hallucinations  Other:  All other systems negative  Physical Examination:   VS: BP (!) 115/59 (BP Location: Right Arm)   Pulse 87   Temp 99.1 F (37.3 C)   Resp  20   Ht 6\' 1"  (1.854 m)   Wt 93.9 kg   SpO2 92%   BMI 27.32 kg/m   General Appearance: No distress  Neuro: without focal findings, mental status, speech normal, alert and oriented, cranial nerves 2-12 intact, reflexes normal and symmetric, sensation grossly normal  HEENT: PERRLA, EOM intact, no ptosis, no other lesions noticed, Mallampati: Pulmonary:.No wheezing, No rales  Sputum Production:   Cardiovascular:  Normal S1,S2.  No m/r/g.  Abdominal aorta pulsation normal.    Abdomen:Benign, Soft, non-tender, No masses, hepatosplenomegaly, No lymphadenopathy Endoc: No evident thyromegaly, no signs of acromegaly or Cushing features Skin:   warm, no rashes, no ecchymosis  Extremities: normal, no cyanosis, clubbing, no edema, warm with normal capillary refill. Other findings:   Labs results:   Recent Labs    07/07/20 0524 07/08/20 0552 07/09/20 0503  HGB 10.6* 9.7* 9.3*  HCT 31.4* 28.5* 28.1*  MCV 91.8 92.2 93.0  WBC 18.8* 15.3* 15.1*  BUN 15 15 14   CREATININE 1.00 0.92 0.90  GLUCOSE 109* 96 110*  CALCIUM 7.7* 7.5* 7.6*  ,        Rad results:   DG Chest [facility] [address]  Result Date: 07/09/2020 CLINICAL DATA:  Follow-up right-sided empyema EXAM: PORTABLE CHEST 1 VIEW COMPARISON:  06/29/2020, 07/04/2020 CT of the chest FINDINGS: Persistent small right pleural collection is noted consistent with the given clinical history of empyema. Pigtail catheter remains in place on the right. Fluid is noted within the minor fissure as well. No pneumothorax is seen. Left lung remains clear. Cardiac shadow is stable. IMPRESSION: Stable right pleural collection with drain in place. No new focal abnormality is seen. Electronically Signed   By: Inez Catalina M.D.   On: 07/09/2020 08:08      EKG:     Other:   Assessment and Plan:    I have personally obtained a history, examined the patient, evaluated laboratory and imaging results, formulated the assessment and plan and placed orders.  The  Patient requires high complexity decision making for assessment and support, frequent evaluation and titration of therapies, application of advanced monitoring technologies and extensive interpretation of multiple databases.   Kemo Spruce,M.D. Pulmonary & Critical care Medicine [facility]

## 2020-07-09 NOTE — Progress Notes (Signed)
PROGRESS NOTE    HAYDN HUTSELL  QBH:[id number] DOB: 17-Jan-1951 DOA: 06/28/2020 PCP: [name]   Chief Complaint  Patient presents with   Shortness of Breath    3 days   Fever  Brief Narrative: 69 year old male with history of left lower back pain, right rotator cuff tendinitis, COPD, erectile dysfunction presented to the ED with right-sided upper back pain and shortness of breath.  She is a current smoker and having increasing his cough but no fever chills. Patient had CT chest with right lower lobe infiltrate pneumonia along with a loculated effusion status post chest tube placement by IR initial lab with exudative fluid also found to have a spiculated 10 mm right upper lobe nodule.Continues to have chest tube in place-which is draining   Subjective: No acute events overnight.  WBC remains stable at 15.1K On room air.  Procalcitonin 0.14 Chest tube continues to drain 93 ml/past 24 hr Patient denied chest pain or shortness of breath.  Is resting comfortably  Assessment & Plan:  Severe sepsis with pneumonia/empyema/Pleural effusion, right: Continues to have drainage 93 mL past 24 hours.  Pulmonary and IR following.CT chest 12/9 shows decreased size of parapneumonic effusion and repeat 12/12 showed stability in complex loculated thick-walled hydropneumothorax.  Culture growing strep intermedius in both pleural fluid and pansensitive- was on Vanco cefepime and has been changed to Unasyn 12/7.Leukocytosis stable at 15.1k.Discussed Dr. Raul Del may need Bigger tube with streptokinase vs ct surgery eval-Pulm coordinating and appreciate input very much. Chest x-ray today stable Rt pleural collection-await for further Pulm recs.  Acute hypoxic respiratory failure:resolved.  AKI due to dehydration resolved.  COPD not in exacerbation continue Pen nebs.  Tobacco abuse- cessation advised.He smokes 1 to 1/2 pack/day.On nicotine patch.    Spiculated pulmonary nodule:?Malignancy-pulm  aware:Dr Raul Del will follow:PET scan as recommended by radiology once acute pneumonia resolves.Cytology was negative for malignancy. I discussed with the patient about his lung nodules and need for follow up.  Diet Order            Diet regular Room service appropriate? Yes; Fluid consistency: Thin  Diet effective now                 Body mass index is 27.31 kg/m.  DVT prophylaxis: enoxaparin (LOVENOX) injection 40 mg Start: 07/02/20 1000 Place TED hose Start: 06/28/20 1352 Code Status:   Code Status: Full Code  Family Communication: plan of care discussed with patient at bedside.  Status is: Inpatient Remains inpatient appropriate because:IV treatments appropriate due to intensity of illness or inability to take PO and Inpatient level of care appropriate due to severity of illness  Dispo: The patient is from: Home              Anticipated d/c is to: Home              Anticipated d/c date is: >3 days              Patient currently is not medically stable to d/c.  Consultants:see note  Procedures:see note  Culture/Microbiology    Component Value Date/Time   SDES  06/28/2020 1417    PLEURAL Performed at [facility], [address] [place], [place] [address]    SDES  06/28/2020 1417    ABSCESS Performed at [facility], [address] [place], [place] [address]    [facility]  06/28/2020 1417    NONE Performed at [facility], [place],  [place] [address]    SPECREQUEST  06/28/2020 1417    EMPYEMA Performed at [facility], [address] [place], [place] [address]    CULT  06/28/2020 1417    RARE STREPTOCOCCUS INTERMEDIUS CRITICAL RESULT CALLED TO, READ BACK BY AND VERIFIED WITH: RN A.LEE AT 1131 ON 07/01/2020 BY T.SAAD Performed at [facility], [place] [address] [place], [place] [address]    CULT  06/28/2020 1417    RARE STREPTOCOCCUS INTERMEDIUS NO ANAEROBES ISOLATED Performed at [facility], [place] [address] [place], [place] [address]    REPTSTATUS 07/04/2020 FINAL 06/28/2020 1417   REPTSTATUS 07/03/2020 FINAL 06/28/2020 1417    Other culture-see note  Medications: Scheduled Meds:  enoxaparin (LOVENOX) injection  40 mg Subcutaneous Q24H   feeding supplement  237 mL Oral TID BM   guaiFENesin  1,200 mg Oral BID   mouth rinse  15 mL Mouth Rinse BID   multivitamin with minerals  1 tablet Oral Daily   nicotine  14 mg Transdermal Daily   Continuous Infusions:  sodium chloride Stopped (07/05/20 1904)   ampicillin-sulbactam (UNASYN) IV 3 g (07/09/20 0517)    Antimicrobials: Anti-infectives (From admission, onward)   Start     Dose/Rate Route Frequency Ordered Stop   06/29/20 1800  Ampicillin-Sulbactam (UNASYN) 3 g in sodium chloride 0.9 % 100 mL IVPB        3 g 200 mL/hr over 30 Minutes Intravenous Every 6 hours 06/29/20 1204     06/29/20 1100  ceFEPIme (MAXIPIME) 2 g in sodium chloride 0.9 % 100 mL IVPB  Status:  Discontinued        2 g 200 mL/hr over 30 Minutes Intravenous Every 24 hours 06/28/20 1620 06/29/20 1204   06/29/20 0800  vancomycin (VANCOCIN) IVPB 1000 mg/200 mL premix  Status:  Discontinued        1,000 mg 200 mL/hr over 60 Minutes Intravenous Every 24 hours 06/28/20 1620 06/29/20 1356   06/28/20 1500  azithromycin (ZITHROMAX) 500 mg in sodium chloride 0.9 % 250 mL IVPB  Status:  Discontinued        500 mg 250 mL/hr over 60 Minutes Intravenous Every 24 hours 06/28/20 1427 06/30/20 1606   06/28/20 1115  vancomycin (VANCOREADY) IVPB 1250 mg/250 mL        1,250 mg 166.7 mL/hr over 90 Minutes Intravenous  Once 06/28/20 1048 06/28/20 1340   06/28/20 1100  ceFEPIme (MAXIPIME) 2 g in sodium chloride 0.9 % 100 mL IVPB        2 g 200 mL/hr over 30 Minutes Intravenous  Once 06/28/20 1048 06/28/20 1222     Objective: Vitals: Today's Vitals   07/08/20 1000 07/08/20 2010 07/08/20 2348 07/09/20 0514  BP:  (!) 92/56 (!) 99/52 (!) 115/59  Pulse:  83  81 87  Resp:  20 20 20   Temp:  98.6 F (37 C) 98.8 F (37.1 C) 99.1 F (37.3 C)  TempSrc:  Oral Oral   SpO2:  94% 95% 92%  Weight:      Height:      PainSc: 0-No pain       Intake/Output Summary (Last 24 hours) at 07/09/2020 1142 Last data filed at 07/09/2020 0507 Gross per 24 hour  Intake 240 ml  Output 193 ml  Net 47 ml   Filed Weights   06/29/20 0412 06/30/20 0252 07/01/20 0359  Weight: 94.2 kg 93.2 kg 93.9 kg   Weight change:   Intake/Output from previous day:  12/16 0701 - 12/17 0700 In: 480 [P.O.:480] Out: 193 [Urine:100; Chest Tube:93] Intake/Output this shift: No intake/output data recorded.  Examination: General exam: AAOx3 , NAD, weak appearing. HEENT:Oral mucosa moist, Ear/Nose WNL grossly, dentition normal. Respiratory system: bilaterally diminished on rt side, w/ chest tube in place,no use of accessory muscle Cardiovascular system: S1 & S2 +, No JVD,. Gastrointestinal system: Abdomen soft, NT,ND, BS+ Nervous System:Alert, awake, moving extremities and grossly nonfocal Extremities: No edema, distal peripheral pulses palpable.  Skin: No rashes,no icterus. MSK: Normal muscle bulk,tone, power  Data Reviewed: I have personally reviewed following labs and imaging studies CBC: Recent Labs  Lab 07/05/20 0439 07/06/20 0750 07/07/20 0524 07/08/20 0552 07/09/20 0503  WBC 20.9* 20.1* 18.8* 15.3* 15.1*  HGB 9.7* 10.2* 10.6* 9.7* 9.3*  HCT 29.2* 30.1* 31.4* 28.5* 28.1*  MCV 92.1 91.8 91.8 92.2 93.0  PLT 423* 431* 479* 458* 203*   Basic Metabolic Panel: Recent Labs  Lab 07/05/20 0439 07/06/20 0750 07/07/20 0524 07/08/20 0552 07/09/20 0503  NA 137 136 138 138 137  K 3.8 3.7 3.7 3.7 3.7  CL 100 100 101 104 104  CO2 30 26 27 25 25   GLUCOSE 110* 109* 109* 96 110*  BUN 18 16 15 15 14   CREATININE 1.00 0.92 1.00 0.92 0.90  CALCIUM 7.3* 7.4* 7.7* 7.5* 7.6*   GFR: Estimated Creatinine Clearance: 85.1 mL/min (by C-G formula based on SCr of 0.9  mg/dL). Liver Function Tests: No results for input(s): AST, ALT, ALKPHOS, BILITOT, PROT, ALBUMIN in the last 168 hours. No results for input(s): LIPASE, AMYLASE in the last 168 hours. No results for input(s): AMMONIA in the last 168 hours. Coagulation Profile: No results for input(s): INR, PROTIME in the last 168 hours. Cardiac Enzymes: No results for input(s): CKTOTAL, CKMB, CKMBINDEX, TROPONINI in the last 168 hours. BNP (last 3 results) No results for input(s): PROBNP in the last 8760 hours. HbA1C: No results for input(s): HGBA1C in the last 72 hours. CBG: No results for input(s): GLUCAP in the last 168 hours. Lipid Profile: No results for input(s): CHOL, HDL, LDLCALC, TRIG, CHOLHDL, LDLDIRECT in the last 72 hours. Thyroid Function Tests: No results for input(s): TSH, T4TOTAL, FREET4, T3FREE, THYROIDAB in the last 72 hours. Anemia Panel: No results for input(s): VITAMINB12, FOLATE, FERRITIN, TIBC, IRON, RETICCTPCT in the last 72 hours. Sepsis Labs: Recent Labs  Lab 07/08/20 0552 07/09/20 0503  PROCALCITON 0.12 0.14    Recent Results (from the past 240 hour(s))  MRSA PCR Screening     Status: None   Collection Time: 06/29/20 11:55 AM   Specimen: Nasal Mucosa; Nasopharyngeal  Result Value Ref Range Status   MRSA by PCR NEGATIVE NEGATIVE Final    Comment:        The GeneXpert MRSA Assay (FDA approved for NASAL specimens only), is one component of a comprehensive MRSA colonization surveillance program. It is not intended to diagnose MRSA infection nor to guide or monitor treatment for MRSA infections. Performed at [facility], [address], [place], [place] [address]   Radiology Studies: DG Chest [place] 1 View  Result Date: 07/09/2020 CLINICAL DATA:  Follow-up right-sided empyema EXAM: PORTABLE CHEST 1 VIEW COMPARISON:  06/29/2020, 07/04/2020 CT of the chest FINDINGS: Persistent small right pleural collection is noted consistent with the given clinical  history of empyema. Pigtail catheter remains in place on the right. Fluid is noted within the minor fissure as well. No pneumothorax is seen. Left lung remains clear. Cardiac shadow is stable.  IMPRESSION: Stable right pleural collection with drain in place. No new focal abnormality is seen. Electronically Signed   By: Inez Catalina M.D.   On: 07/09/2020 08:08     LOS: 10 days   Antonieta Pert, MD [facility]  07/09/2020, 11:42 AM

## 2020-07-10 ENCOUNTER — Inpatient Hospital Stay (HOSPITAL_COMMUNITY): Payer: Medicare HMO

## 2020-07-10 DIAGNOSIS — J90 Pleural effusion, not elsewhere classified: Secondary | ICD-10-CM

## 2020-07-10 DIAGNOSIS — J869 Pyothorax without fistula: Secondary | ICD-10-CM

## 2020-07-10 DIAGNOSIS — R918 Other nonspecific abnormal finding of lung field: Secondary | ICD-10-CM

## 2020-07-10 DIAGNOSIS — J9601 Acute respiratory failure with hypoxia: Secondary | ICD-10-CM

## 2020-07-10 LAB — CBC WITH DIFFERENTIAL/PLATELET
Abs Immature Granulocytes: 0.16 10*3/uL — ABNORMAL HIGH (ref 0.00–0.07)
Basophils Absolute: 0.1 10*3/uL (ref 0.0–0.1)
Basophils Relative: 0 %
Eosinophils Absolute: 0.1 10*3/uL (ref 0.0–0.5)
Eosinophils Relative: 1 %
HCT: 28.2 % — ABNORMAL LOW (ref 39.0–52.0)
Hemoglobin: 9 g/dL — ABNORMAL LOW (ref 13.0–17.0)
Immature Granulocytes: 1 %
Lymphocytes Relative: 12 %
Lymphs Abs: 1.6 10*3/uL (ref 0.7–4.0)
MCH: 29.9 pg (ref 26.0–34.0)
MCHC: 31.9 g/dL (ref 30.0–36.0)
MCV: 93.7 fL (ref 80.0–100.0)
Monocytes Absolute: 0.9 10*3/uL (ref 0.1–1.0)
Monocytes Relative: 6 %
Neutro Abs: 11 10*3/uL — ABNORMAL HIGH (ref 1.7–7.7)
Neutrophils Relative %: 80 %
Platelets: 469 10*3/uL — ABNORMAL HIGH (ref 150–400)
RBC: 3.01 MIL/uL — ABNORMAL LOW (ref 4.22–5.81)
RDW: 14.1 % (ref 11.5–15.5)
WBC: 13.8 10*3/uL — ABNORMAL HIGH (ref 4.0–10.5)
nRBC: 0 % (ref 0.0–0.2)

## 2020-07-10 LAB — COMPREHENSIVE METABOLIC PANEL
ALT: 23 U/L (ref 0–44)
AST: 25 U/L (ref 15–41)
Albumin: 1.7 g/dL — ABNORMAL LOW (ref 3.5–5.0)
Alkaline Phosphatase: 37 U/L — ABNORMAL LOW (ref 38–126)
Anion gap: 11 (ref 5–15)
BUN: 12 mg/dL (ref 8–23)
CO2: 24 mmol/L (ref 22–32)
Calcium: 7.8 mg/dL — ABNORMAL LOW (ref 8.9–10.3)
Chloride: 102 mmol/L (ref 98–111)
Creatinine, Ser: 1.01 mg/dL (ref 0.61–1.24)
GFR, Estimated: >60 mL/min (ref >60)
Glucose, Bld: 103 mg/dL — ABNORMAL HIGH (ref 70–99)
Potassium: 3.7 mmol/L (ref 3.5–5.1)
Sodium: 137 mmol/L (ref 135–145)
Total Bilirubin: 0.8 mg/dL (ref 0.3–1.2)
Total Protein: 5.1 g/dL — ABNORMAL LOW (ref 6.5–8.1)

## 2020-07-10 MED ORDER — STERILE WATER FOR INJECTION IJ SOLN
5.0000 mg | Freq: Two times a day (BID) | RESPIRATORY_TRACT | Status: AC
  Administered 2020-07-10 – 2020-07-13 (×6): 5 mg via INTRAPLEURAL
  Filled 2020-07-10 (×7): qty 5

## 2020-07-10 MED ORDER — ALTEPLASE 2 MG IJ SOLR
10.0000 mg | Freq: Two times a day (BID) | INTRAMUSCULAR | Status: AC
  Administered 2020-07-10 – 2020-07-12 (×5): 10 mg via INTRAPLEURAL
  Filled 2020-07-10 (×7): qty 10

## 2020-07-10 NOTE — Progress Notes (Signed)
Per MD order, released clamp and put pt on suction at 20, will continue to monitor, Thanks Arvella Nigh RN.

## 2020-07-10 NOTE — Consult Note (Addendum)
NAME:  Luis Haney, MRN:  [id number], DOB:  09-20-1950, LOS: 1 ADMISSION DATE:  07/09/2020, CONSULTATION DATE:  07/10/2020 REFERRING MD:  Dr Aileen Fass, CHIEF COMPLAINT:  empyema   Brief History:  Patient with empyema transferred from [place] for loculated pleural effusion  History of Present Illness:  Was initially admitted for right-sided chest pain, found to have a pleural fluid collection for which he had a chest tube placed, Cultures showing Streptococcus intermedius sensitive to Unasyn Past Medical History:  History reviewed. No pertinent past medical history. Loculated pleural effusion, Smoker Probable obstructive lung disease Chronic back pain for which he had surgery in the past  Significant Hospital Events:  Chest tube in place-with empyema, loculated effusion  Consults:  Cardiothoracic surgery Pulmonary  Procedures:  Chest tube placement  Significant Diagnostic Tests:  Pleural fluid consistent with empyema with strep intermedius and glucose of 20  Micro Data:  Strep intermedius and pleural fluid 12/6 Blood cultures negative  Antimicrobials:  Day 13 of Unasyn  Interim History / Subjective:  Patient is feeling relatively well Denies any pain or discomfort  Objective   Blood pressure 118/73, pulse 82, temperature 97.9 F (36.6 C), temperature source Oral, resp. rate 20, height 6\' 1"  (1.854 m), weight 92.2 kg, SpO2 93 %.        Intake/Output Summary (Last 24 hours) at 07/10/2020 1601 Last data filed at 07/10/2020 1300 Gross per 24 hour  Intake 120 ml  Output 990 ml  Net -870 ml   Filed Weights   07/09/20 2211  Weight: 92.2 kg    Examination: General: Elderly, appears well-nourished HENT: Moist oral mucosa Lungs: Decreased air entry right base Cardiovascular: S1-S2 appreciated Abdomen: Bowel sounds appreciated Extremities: No edema Neuro: No focal findings GU: Fair output  CT chest reviewed by myself and scans reviewed with the  patient  Resolved Hospital Problem list     Assessment & Plan:  Right-sided empyema Loculated pleural effusion Chronic obstructive pulmonary disease/emphysema  -Continue chest tube to suction -We will plan for TPA/dornase alpha for 3 days every 12 hours -Appreciate cardiothoracic surgery evaluation  -If thrombolytics do not help then may need to consider VATS and decortication  Smoking cessation counseling  Further evaluation of right upper lobe nodule-PET scan  Bronchodilators  Anticipate being able to transition to p.o. Augmentin as long as is not having any fevers and leukocytosis is stable  Will continue to follow  Discussed with Dr. Aileen Fass  Best practice (evaluated daily)  Diet: Regular Pain/Anxiety/Delirium protocol (if indicated): Tylenol for pain VAP protocol (if indicated):  DVT prophylaxis: GI prophylaxis:  Glucose control:  Mobility: As tolerated Disposition: Medical floor   Labs   CBC: Recent Labs  Lab 07/06/20 0750 07/07/20 0524 07/08/20 0552 07/09/20 0503 07/10/20 0338  WBC 20.1* 18.8* 15.3* 15.1* 13.8*  NEUTROABS  --   --   --   --  11.0*  HGB 10.2* 10.6* 9.7* 9.3* 9.0*  HCT 30.1* 31.4* 28.5* 28.1* 28.2*  MCV 91.8 91.8 92.2 93.0 93.7  PLT 431* 479* 458* 473* 469*    Basic Metabolic Panel: Recent Labs  Lab 07/06/20 0750 07/07/20 0524 07/08/20 0552 07/09/20 0503 07/10/20 0338  NA 136 138 138 137 137  K 3.7 3.7 3.7 3.7 3.7  CL 100 101 104 104 102  CO2 26 27 25 25 24   GLUCOSE 109* 109* 96 110* 103*  BUN 16 15 15 14 12   CREATININE 0.92 1.00 0.92 0.90 1.01  CALCIUM 7.4* 7.7* 7.5*  7.6* 7.8*   GFR: Estimated Creatinine Clearance: 75.8 mL/min (by C-G formula based on SCr of 1.01 mg/dL). Recent Labs  Lab 07/07/20 0524 07/08/20 0552 07/09/20 0503 07/10/20 0338  PROCALCITON  --  0.12 0.14  --   WBC 18.8* 15.3* 15.1* 13.8*    Liver Function Tests: Recent Labs  Lab 07/10/20 0338  AST 25  ALT 23  ALKPHOS 37*  BILITOT 0.8   PROT 5.1*  ALBUMIN 1.7*   No results for input(s): LIPASE, AMYLASE in the last 168 hours. No results for input(s): AMMONIA in the last 168 hours.  ABG No results found for: PHART, PCO2ART, PO2ART, HCO3, TCO2, ACIDBASEDEF, O2SAT   Coagulation Profile: No results for input(s): INR, PROTIME in the last 168 hours.  Cardiac Enzymes: No results for input(s): CKTOTAL, CKMB, CKMBINDEX, TROPONINI in the last 168 hours.  HbA1C: No results found for: HGBA1C  CBG: No results for input(s): GLUCAP in the last 168 hours.  Review of Systems:   Denies any pain or discomfort at present  Past Medical History:  He,  has no past medical history on file.   Surgical History:   Past Surgical History:  Procedure Laterality Date   BACK SURGERY  2000   NECK SURGERY  2012     Social History:   reports that he has been smoking cigarettes. He has been smoking about 0.25 packs per day. He has never used smokeless tobacco. He reports current alcohol use. He reports previous drug use.   Family History:  His family history includes Heart disease in his mother; Other in his father.   Allergies No Known Allergies   Home Medications  Prior to Admission medications   Medication Sig Start Date End Date Taking? Authorizing Provider  Ampicillin-Sulbactam 3 g in sodium chloride 0.9 % 100 mL Inject 3 g into the vein every 6 (six) hours. 07/09/20   Antonieta Pert, MD  calcium carbonate (OS-CAL - DOSED IN MG OF ELEMENTAL CALCIUM) 1250 (500 Ca) MG tablet Take 1 tablet by mouth.    [provider]  guaiFENesin (MUCINEX) 600 MG 12 hr tablet Take 2 tablets (1,200 mg total) by mouth 2 (two) times daily. 07/09/20   Antonieta Pert, MD  Multiple Vitamin (MULTI VITAMIN MENS PO) Take by mouth.    [provider]  nicotine (NICODERM CQ - DOSED IN MG/24 HOURS) 14 mg/24hr patch Place 1 patch (14 mg total) onto the skin daily. 07/10/20   Antonieta Pert, MD  Potassium 99 MG TABS Take 99 mg by mouth daily.     [provider]    Sherrilyn Rist, MD [facility] Pager: [phone]

## 2020-07-10 NOTE — Consult Note (Addendum)
Reason for Consult: Referring Physician: Hospitalist  Luis Haney is an 69 y.o. male.  With chief complaint of right-sided empyema HPI: We are asked to see this 69 year old male transferred from [facility] due to a right-sided empyema.  The patient was admitted to [facility] on 06/28/2020 where he presented with cough and fatigue.  He originally presented to an urgent care and was transferred to the emergency department when he was found to be hypoxic and hypotensive.  Oxygen saturations on room air were 86%.  A CT scan of the chest on 12 6 showed a large loculated right pleural effusion with compressive atelectasis versus infiltrate/pneumonia in the right lung.  There is also a finding of a right upper lobe spiculated nodule.  Interventional radiology consultation was obtained for chest tube placement.  Cultures were obtained and staph intermedius grew with sensitivity to Unasyn.  It was felt the patient may require a larger bore chest tube or possible thoracic surgical intervention and he was transferred to [facility] last night.  White blood cell count is improving over time and most recent on today's date is 13.8.  He is noted to be anemic with a H&H of 9.0/28.2.  Most recent procalcitonin level of 0.14.  History reviewed. No pertinent past medical history.  Past Surgical History:  Procedure Laterality Date  . BACK SURGERY  2000  . NECK SURGERY  2012    Family History  Problem Relation Age of Onset  . Heart disease Mother   . Other Father        "old age"    Social History:  reports that he has been smoking cigarettes. He has been smoking about 0.25 packs per day. He has never used smokeless tobacco. He reports current alcohol use. He reports previous drug use.  Allergies: No Known Allergies  Medications:  Continuous: . ampicillin-sulbactam (UNASYN) IV 3 g (07/10/20 1059)    . calcium carbonate  1 tablet Oral TID WC  . guaiFENesin  1,200 mg Oral BID  . nicotine   14 mg Transdermal Daily    Results for orders placed or performed during the hospital encounter of 07/09/20 (from the past 48 hour(s))  Comprehensive metabolic panel     Status: Abnormal   Collection Time: 07/10/20  3:38 AM  Result Value Ref Range   Sodium 137 135 - 145 mmol/L   Potassium 3.7 3.5 - 5.1 mmol/L   Chloride 102 98 - 111 mmol/L   CO2 24 22 - 32 mmol/L   Glucose, Bld 103 (H) 70 - 99 mg/dL    Comment: Glucose reference range applies only to samples taken after fasting for at least 8 hours.   BUN 12 8 - 23 mg/dL   Creatinine, Ser 1.01 0.61 - 1.24 mg/dL   Calcium 7.8 (L) 8.9 - 10.3 mg/dL   Total Protein 5.1 (L) 6.5 - 8.1 g/dL   Albumin 1.7 (L) 3.5 - 5.0 g/dL   AST 25 15 - 41 U/L   ALT 23 0 - 44 U/L   Alkaline Phosphatase 37 (L) 38 - 126 U/L   Total Bilirubin 0.8 0.3 - 1.2 mg/dL   GFR, Estimated >60 >60 mL/min    Comment: (NOTE) Calculated using the CKD-EPI Creatinine Equation (2021)    Anion gap 11 5 - 15    Comment: Performed at [facility], [place] [address], [place], [place] [address]  CBC WITH DIFFERENTIAL     Status: Abnormal   Collection Time: 07/10/20  3:38 AM  Result Value Ref Range   WBC 13.8 (H) 4.0 - 10.5 K/uL   RBC 3.01 (L) 4.22 - 5.81 MIL/uL   Hemoglobin 9.0 (L) 13.0 - 17.0 g/dL   HCT 28.2 (L) 39.0 - 52.0 %   MCV 93.7 80.0 - 100.0 fL   MCH 29.9 26.0 - 34.0 pg   MCHC 31.9 30.0 - 36.0 g/dL   RDW 14.1 11.5 - 15.5 %   Platelets 469 (H) 150 - 400 K/uL   nRBC 0.0 0.0 - 0.2 %   Neutrophils Relative % 80 %   Neutro Abs 11.0 (H) 1.7 - 7.7 K/uL   Lymphocytes Relative 12 %   Lymphs Abs 1.6 0.7 - 4.0 K/uL   Monocytes Relative 6 %   Monocytes Absolute 0.9 0.1 - 1.0 K/uL   Eosinophils Relative 1 %   Eosinophils Absolute 0.1 0.0 - 0.5 K/uL   Basophils Relative 0 %   Basophils Absolute 0.1 0.0 - 0.1 K/uL   Immature Granulocytes 1 %   Abs Immature Granulocytes 0.16 (H) 0.00 - 0.07 K/uL    Comment: Performed at [facility], [address]  [place], [place] [address]    DG CHEST PORT 1 VIEW  Result Date: 07/10/2020 CLINICAL DATA:  Pleural effusion. EXAM: PORTABLE CHEST 1 VIEW COMPARISON:  Chest radiograph December 17, 21. FINDINGS: Similar versus slightly increased small right pleural effusion with similar positioning of a right-sided pigtail catheter. Redemonstrated fluid within the right major fissure. Similar scattered opacities in the right lung. Nodules better characterized on prior CT chest. No visible pneumothorax. Similar cardiomediastinal silhouette. IMPRESSION: Similar versus slightly increased right pleural effusion with similar position of the pleural drain. Electronically Signed   By: Margaretha Sheffield MD   On: 07/10/2020 08:05   DG Chest Port 1 View  Result Date: 07/09/2020 CLINICAL DATA:  Follow-up right-sided empyema EXAM: PORTABLE CHEST 1 VIEW COMPARISON:  06/29/2020, 07/04/2020 CT of the chest FINDINGS: Persistent small right pleural collection is noted consistent with the given clinical history of empyema. Pigtail catheter remains in place on the right. Fluid is noted within the minor fissure as well. No pneumothorax is seen. Left lung remains clear. Cardiac shadow is stable. IMPRESSION: Stable right pleural collection with drain in place. No new focal abnormality is seen. Electronically Signed   By: Inez Catalina M.D.   On: 07/09/2020 08:08    Chest CT 07/04/20   IMPRESSION: 1. Right pigtail pleural drain in place, terminating within a complex, thick-walled hydropneumothorax. Loculated components are again seen extending into the minor fissure and to a lesser extent the major fissure as well. Overall size and extent of this collection is not significantly changed from the comparison. 2. Scattered secretions seen within the airways. 3. Slight interval increase in size of the cavitary nodule with layering air-fluid level in the medial right lower lobe. Could reflect intrapulmonary abscess or cavitary nodule. 4.  Stable spiculated nodule towards the right lung apex measuring up to 14 mm in size. 5. Small left pleural effusion and atelectasis. 6. Aortic Atherosclerosis (ICD10-I70.0) 7. Emphysema (ICD10-J43.9).   Review of Systems  Constitutional: Positive for activity change and diaphoresis. Negative for appetite change, chills, fatigue, fever and unexpected weight change.  HENT: Positive for dental problem. Negative for congestion, drooling, ear discharge, ear pain, facial swelling, hearing loss, mouth sores, nosebleeds, postnasal drip, rhinorrhea, sinus pressure, sinus pain, sneezing, sore throat, tinnitus and trouble swallowing.        Root canal and  crown last month  Eyes: Negative.   Respiratory: Positive for choking, chest tightness and shortness of breath. Negative for apnea, cough, wheezing and stridor.   Cardiovascular: Positive for leg swelling. Negative for chest pain and palpitations.  Gastrointestinal: Negative.   Endocrine: Positive for cold intolerance. Negative for heat intolerance, polydipsia and polyphagia.  Genitourinary: Negative.   Musculoskeletal: Positive for arthralgias and back pain. Negative for gait problem, joint swelling, myalgias, neck pain and neck stiffness.  Skin: Negative.   Allergic/Immunologic: Positive for environmental allergies. Negative for food allergies and immunocompromised state.  Neurological: Positive for tremors and weakness. Negative for dizziness, seizures, syncope, facial asymmetry, speech difficulty, light-headedness, numbness and headaches.  Hematological: Negative for adenopathy. Bruises/bleeds easily.  Psychiatric/Behavioral: Negative.    Blood pressure 106/65, pulse 87, temperature 98.9 F (37.2 C), temperature source Oral, resp. rate 20, height 6\' 1"  (1.854 m), weight 92.2 kg, SpO2 93 %. Physical Exam Constitutional:      General: He is not in acute distress.    Appearance: Normal appearance. He is normal weight. He is ill-appearing. He is  not toxic-appearing or diaphoretic.  HENT:     Head: Normocephalic and atraumatic.     Nose: Nose normal.     Mouth/Throat:     Mouth: Mucous membranes are moist.     Pharynx: Oropharynx is clear.  Eyes:     General: No scleral icterus.    Extraocular Movements: Extraocular movements intact.     Conjunctiva/sclera: Conjunctivae normal.     Pupils: Pupils are equal, round, and reactive to light.  Cardiovascular:     Rate and Rhythm: Normal rate and regular rhythm.     Pulses: Normal pulses.     Heart sounds: Normal heart sounds. No murmur heard. No gallop.   Pulmonary:     Effort: Pulmonary effort is normal.     Breath sounds: No wheezing, rhonchi or rales.     Comments: Dim BS right lower fields Chest:     Chest wall: No tenderness.  Abdominal:     Palpations: Abdomen is soft. There is no mass.     Tenderness: There is no abdominal tenderness.  Musculoskeletal:        General: No deformity.     Cervical back: Normal range of motion.     Right lower leg: Edema present.     Left lower leg: Edema present.  Skin:    General: Skin is warm and dry.     Capillary Refill: Capillary refill takes less than 2 seconds.     Findings: No erythema or rash.  Neurological:     General: No focal deficit present.     Mental Status: He is alert.  Psychiatric:        Mood and Affect: Mood normal.        Behavior: Behavior normal.        Judgment: Judgment normal.     Assessment/Plan: Right empyema, spiculated mass RUL, recent dental surgery could be a source of infection. Long term tobacco use   Patient Active Problem List   Diagnosis Date Noted  . Malnutrition of moderate degree 07/09/2020  . Empyema lung ([place]) 06/29/2020  . Acute hypoxemic respiratory failure ([place]) 06/28/2020  . Pleural effusion, right 06/28/2020  . Mass of upper lobe of right lung 06/28/2020  . Tobacco use disorder, moderate, dependence 06/28/2020  . Neutrophilic leukocytosis 05/20/2535  . Loculated pleural  effusion 06/28/2020  . Lobar pneumonia ([place]) 06/28/2020   John Giovanni PA-C 07/10/2020,  11:21 AM   Patient seems to be improving - he was not sure why he was sent to [facility], He thought he was going home when told by nurses his discharge was done.  Have asked nursing to change pleuro vac so we can arcuately measure output. Would not recommend thoracotomy at this point - now more then 2 weeks into process would not be "simple" VATS Agree with pulmonary - to try medical lysis Patient long term smoker - until admission Question of right upper lobe nodule - will need to be evaluated when current situation has cleared  I have seen and examined Luis Haney and agree with the above assessment  and plan.  Grace Isaac MD Beeper [phone] Office [phone] 07/10/2020 5:38 PM

## 2020-07-10 NOTE — Progress Notes (Signed)
Instilled TPA and dornase alpha into chest tube  Chest tube will be left clamped for 4 hours  Encourage patient to rotate to ensure adequate redistribution of the fibrinolytic  Chest tube clamp will be removed at 9 PM and placed to suction -20  Plan will be to 12 for 6 doses  Tolerated instillation well

## 2020-07-10 NOTE — Progress Notes (Addendum)
[facility] PROGRESS NOTE    Progress Note  Luis Haney  KDT:[id number] DOB: 1950/12/18 DOA: 07/09/2020 PCP: [name]     Brief Narrative:   Luis Haney is an 69 y.o. male past medical history significant for tobacco abuse and COPD admitted on 06/28/2020 at [facility] find to have a right-sided empyema chest tube was placed it grew staph intermedius sensitive to Unasyn patient was transferred to [place] might need large chest tube streptokinase versus decortication.  Cardiothoracic surgery reevaluated on 07/08/2020 he was found to spiculated mass 10 mm in the right upper lobe.  Assessment/Plan:   Severe sepsis with pneumonia right-sided empyema status post chest tube: Pulmonary and critical care was consulted chest tube was placed on 07/01/2020 Cultures grew strep intermedius 12-/12/2019 sensitive to Unasyn.  Transfer to: For large chest tube placement to be given streptokinase versus decortication. Cardiothoracic surgery is in the case Dr. Pia Mau notified on admission. Continue Unasyn. X-ray shows is morning I have personally reviewed, similar effusion unchanged from previous chest tube in place.  He continues to remain afebrile.  Spiculated 14 mm pulmonary nodule on right upper lobe: Pulmonary Dr. Raul Del is aware. Patient scheduled for PET scan once acute issues have resolved. Etiology was negative for malignancy.  Acute respiratory failure with hypoxia: Likely due to empyema and pneumonia now resolved.  Acute kidney injury: Likely prerenal azotemia resolved with IV fluids.  COPD not in exacerbation: Appears stable continue inhalers.  Normocytic anemia: Appears to be stable follow-up with PCP as an outpatient.   DVT prophylaxis: Lovenox Family Communication:none Status is: Inpatient  Remains inpatient appropriate because:Hemodynamically unstable   Dispo: The patient is from: Home              Anticipated d/c is to: Home               Anticipated d/c date is: > 3 days              Patient currently is not medically stable to d/c.        Code Status:     Code Status Orders  (From admission, onward)         Start     Ordered   07/09/20 2333  Full code  Continuous        07/09/20 2333        Code Status History    Date Active Date Inactive Code Status Order ID Comments User Context   06/28/2020 1353 07/09/2020 2200 Full Code [id number]  [facility] C[facility], DO ED   Advance Care Planning Activity        IV Access:    Peripheral IV   Procedures and diagnostic studies:   DG Chest Port 1 View  Result Date: 07/09/2020 CLINICAL DATA:  Follow-up right-sided empyema EXAM: PORTABLE CHEST 1 VIEW COMPARISON:  06/29/2020, 07/04/2020 CT of the chest FINDINGS: Persistent small right pleural collection is noted consistent with the given clinical history of empyema. Pigtail catheter remains in place on the right. Fluid is noted within the minor fissure as well. No pneumothorax is seen. Left lung remains clear. Cardiac shadow is stable. IMPRESSION: Stable right pleural collection with drain in place. No new focal abnormality is seen. Electronically Signed   By: Inez Catalina M.D.   On: 07/09/2020 08:08     Medical Consultants:    None.  Anti-Infectives:   unasyn  Subjective:    Luis Haney pain-free shortness of breath is slowly  improving has remained afebrile.  Objective:    Vitals:   07/09/20 2211 07/10/20 0441 07/10/20 0718  BP: 134/69 109/67 106/65  Pulse: 79 91 87  Resp: 16 20 20   Temp: 98.7 F (37.1 C) 98.9 F (37.2 C) 98.9 F (37.2 C)  TempSrc: Oral Oral Oral  SpO2: 96% 90% 93%  Weight: 92.2 kg    Height: 6\' 1"  (1.854 m)     SpO2: 93 %   Intake/Output Summary (Last 24 hours) at 07/10/2020 0802 Last data filed at 07/10/2020 0547 Gross per 24 hour  Intake 0 ml  Output 590 ml  Net -590 ml   Filed Weights   07/09/20 2211  Weight: 92.2 kg    Exam: General exam: In no  acute distress. Respiratory system: Good air movement and clear to auscultation. Cardiovascular system: S1 & S2 heard, RRR. No JVD. Gastrointestinal system: Abdomen is nondistended, soft and nontender.  Extremities: No pedal edema. Skin: No rashes, lesions or ulcers Psychiatry: Judgement and insight appear normal. Mood & affect appropriate.    Data Reviewed:    Labs: Basic Metabolic Panel: Recent Labs  Lab 07/06/20 0750 07/07/20 0524 07/08/20 0552 07/09/20 0503 07/10/20 0338  NA 136 138 138 137 137  K 3.7 3.7 3.7 3.7 3.7  CL 100 101 104 104 102  CO2 26 27 25 25 24   GLUCOSE 109* 109* 96 110* 103*  BUN 16 15 15 14 12   CREATININE 0.92 1.00 0.92 0.90 1.01  CALCIUM 7.4* 7.7* 7.5* 7.6* 7.8*   GFR Estimated Creatinine Clearance: 75.8 mL/min (by C-G formula based on SCr of 1.01 mg/dL). Liver Function Tests: Recent Labs  Lab 07/10/20 0338  AST 25  ALT 23  ALKPHOS 37*  BILITOT 0.8  PROT 5.1*  ALBUMIN 1.7*   No results for input(s): LIPASE, AMYLASE in the last 168 hours. No results for input(s): AMMONIA in the last 168 hours. Coagulation profile No results for input(s): INR, PROTIME in the last 168 hours. COVID-19 Labs  No results for input(s): DDIMER, FERRITIN, LDH, CRP in the last 72 hours.  Lab Results  Component Value Date   SARSCOV2NAA NEGATIVE 06/28/2020   [place] Not Detected 12/17/2018    CBC: Recent Labs  Lab 07/06/20 0750 07/07/20 0524 07/08/20 0552 07/09/20 0503 07/10/20 0338  WBC 20.1* 18.8* 15.3* 15.1* 13.8*  NEUTROABS  --   --   --   --  11.0*  HGB 10.2* 10.6* 9.7* 9.3* 9.0*  HCT 30.1* 31.4* 28.5* 28.1* 28.2*  MCV 91.8 91.8 92.2 93.0 93.7  PLT 431* 479* 458* 473* 469*   Cardiac Enzymes: No results for input(s): CKTOTAL, CKMB, CKMBINDEX, TROPONINI in the last 168 hours. BNP (last 3 results) No results for input(s): PROBNP in the last 8760 hours. CBG: No results for input(s): GLUCAP in the last 168 hours. D-Dimer: No results for  input(s): DDIMER in the last 72 hours. Hgb A1c: No results for input(s): HGBA1C in the last 72 hours. Lipid Profile: No results for input(s): CHOL, HDL, LDLCALC, TRIG, CHOLHDL, LDLDIRECT in the last 72 hours. Thyroid function studies: No results for input(s): TSH, T4TOTAL, T3FREE, THYROIDAB in the last 72 hours.  Invalid input(s): FREET3 Anemia work up: No results for input(s): VITAMINB12, FOLATE, FERRITIN, TIBC, IRON, RETICCTPCT in the last 72 hours. Sepsis Labs: Recent Labs  Lab 07/07/20 0524 07/08/20 0552 07/09/20 0503 07/10/20 0338  PROCALCITON  --  0.12 0.14  --   WBC 18.8* 15.3* 15.1* 13.8*   Microbiology No results found for  this or any previous visit (from the past 240 hour(s)).   Medications:    calcium carbonate  1 tablet Oral TID WC   guaiFENesin  1,200 mg Oral BID   nicotine  14 mg Transdermal Daily   Continuous Infusions:  ampicillin-sulbactam (UNASYN) IV 3 g (07/10/20 0547)      LOS: 1 day   Charlynne Cousins  [facility]  07/10/2020, 8:02 AM

## 2020-07-11 ENCOUNTER — Inpatient Hospital Stay (HOSPITAL_COMMUNITY): Payer: Medicare HMO

## 2020-07-11 NOTE — Plan of Care (Signed)
Problem: Elimination: Goal: Will not experience complications related to urinary retention Outcome: Completed/Met   Problem: Pain Managment: Goal: General experience of comfort will improve Outcome: Completed/Met   Problem: Safety: Goal: Ability to remain free from injury will improve Outcome: Completed/Met   Problem: Skin Integrity: Goal: Risk for impaired skin integrity will decrease Outcome: Completed/Met   

## 2020-07-11 NOTE — Procedures (Signed)
Pleural Fibrinolytic Administration Procedure Note  KELTIN BAIRD  [id number]  [birth year]  Date:07/11/20  Time:6:03 AM   Provider Performing:Imani Fiebelkorn P Carlis Abbott   Procedure: Pleural Fibrinolysis Subsequent day [phone])  Indication(s) Fibrinolysis of complicated pleural effusion  Consent Risks of the procedure as well as the alternatives and risks of each were explained to the patient and/or caregiver.  Consent for the procedure was obtained.   Anesthesia None   Time Out Verified patient identification, verified procedure, site/side was marked, verified correct patient position, special equipment/implants available, medications/allergies/relevant history reviewed, required imaging and test results available.   Sterile Technique Hand hygiene, gloves   Procedure Description Existing pleural catheter was cleaned and accessed in sterile manner.  10mg  of tPA in 30cc of saline and 5mg  of dornase in 30cc of sterile water were injected into pleural space using existing pleural catheter.  Catheter will be clamped for 1 hour and then placed back to suction.   Complications/Tolerance None; patient tolerated the procedure well.   EBL None   Specimen(s) None  Julian Hy, DO 07/11/20 6:03 AM [facility]

## 2020-07-11 NOTE — Progress Notes (Signed)
[facility] PROGRESS NOTE    Progress Note  Luis Haney  UJW:[id number] DOB: Nov 01, 1950 DOA: 07/09/2020 PCP: [name]     Brief Narrative:   Luis Haney is an 69 y.o. male past medical history significant for tobacco abuse and COPD admitted on 06/28/2020 at [facility] find to have a right-sided empyema chest tube was placed it grew staph intermedius sensitive to Unasyn patient was transferred to [place] might need large chest tube streptokinase versus decortication.  Cardiothoracic surgery reevaluated on 07/08/2020 he was found to spiculated mass 10 mm in the right upper lobe.  Assessment/Plan:   Severe sepsis with pneumonia right-sided empyema status post chest tube: Pulmonary and critical care was consulted chest tube was placed on 07/01/2020 Cultures grew strep intermedius 12-/12/2019 sensitive to Unasyn.  Pulmonary critical care was consulted streptokinase being given through tube, about 500 cc of pus came out in 24-hour period. Chest x-ray shows much improvement compared to previous ones.  Spiculated 14 mm pulmonary nodule on right upper lobe: Pulmonary Dr. Raul Del is aware. Patient scheduled for PET scan once acute issues have resolved. Etiology was negative for malignancy.  Acute respiratory failure with hypoxia: Likely due to pneumonia and empyema now resolved.  Acute kidney injury: Likely prerenal azotemia resolved with IV fluids.  COPD not in exacerbation: Appears stable continue inhalers.  Normocytic anemia: Appears to be stable follow-up with PCP as an outpatient.   DVT prophylaxis: Lovenox Family Communication:none Status is: Inpatient  Remains inpatient appropriate because:Hemodynamically unstable   Dispo: The patient is from: Home              Anticipated d/c is to: Home              Anticipated d/c date is: > 3 days              Patient currently is not medically stable to d/c.        Code Status:      Code Status Orders  (From admission, onward)         Start     Ordered   07/09/20 2333  Full code  Continuous        07/09/20 2333        Code Status History    Date Active Date Inactive Code Status Order ID Comments User Context   06/28/2020 1353 07/09/2020 2200 Full Code [id number]  [facility], DO ED   Advance Care Planning Activity        IV Access:    Peripheral IV   Procedures and diagnostic studies:   DG CHEST PORT 1 VIEW  Result Date: 07/10/2020 CLINICAL DATA:  Pleural effusion. EXAM: PORTABLE CHEST 1 VIEW COMPARISON:  Chest radiograph December 17, 21. FINDINGS: Similar versus slightly increased small right pleural effusion with similar positioning of a right-sided pigtail catheter. Redemonstrated fluid within the right major fissure. Similar scattered opacities in the right lung. Nodules better characterized on prior CT chest. No visible pneumothorax. Similar cardiomediastinal silhouette. IMPRESSION: Similar versus slightly increased right pleural effusion with similar position of the pleural drain. Electronically Signed   By: Margaretha Sheffield MD   On: 07/10/2020 08:05     Medical Consultants:    None.  Anti-Infectives:   unasyn  Subjective:    Luis Haney chest pain-free shortness of breath resolved.  Objective:    Vitals:   07/10/20 1156 07/10/20 1920 07/11/20 0052 07/11/20 0400  BP: 118/73 106/61 114/65 (!) 100/54  Pulse: 82 87 80 88  Resp: 20 18 18 20   Temp: 97.9 F (36.6 C) 98.8 F (37.1 C) 98.2 F (36.8 C) 98.6 F (37 C)  TempSrc: Oral Oral Oral Oral  SpO2: 93% 95% 95% 95%  Weight:    90.5 kg  Height:       SpO2: 95 %   Intake/Output Summary (Last 24 hours) at 07/11/2020 1014 Last data filed at 07/11/2020 0700 Gross per 24 hour  Intake 1020 ml  Output 1725 ml  Net -705 ml   Filed Weights   07/09/20 2211 07/11/20 0400  Weight: 92.2 kg 90.5 kg    Exam: General exam: In no acute distress. Respiratory system: Good air  movement and clear to auscultation. Cardiovascular system: S1 & S2 heard, RRR. No JVD. Gastrointestinal system: Abdomen is nondistended, soft and nontender.  Extremities: No pedal edema. Skin: No rashes, lesions or ulcers  Data Reviewed:    Labs: Basic Metabolic Panel: Recent Labs  Lab 07/06/20 0750 07/07/20 0524 07/08/20 0552 07/09/20 0503 07/10/20 0338  NA 136 138 138 137 137  K 3.7 3.7 3.7 3.7 3.7  CL 100 101 104 104 102  CO2 26 27 25 25 24   GLUCOSE 109* 109* 96 110* 103*  BUN 16 15 15 14 12   CREATININE 0.92 1.00 0.92 0.90 1.01  CALCIUM 7.4* 7.7* 7.5* 7.6* 7.8*   GFR Estimated Creatinine Clearance: 75.8 mL/min (by C-G formula based on SCr of 1.01 mg/dL). Liver Function Tests: Recent Labs  Lab 07/10/20 0338  AST 25  ALT 23  ALKPHOS 37*  BILITOT 0.8  PROT 5.1*  ALBUMIN 1.7*   No results for input(s): LIPASE, AMYLASE in the last 168 hours. No results for input(s): AMMONIA in the last 168 hours. Coagulation profile No results for input(s): INR, PROTIME in the last 168 hours. COVID-19 Labs  No results for input(s): DDIMER, FERRITIN, LDH, CRP in the last 72 hours.  Lab Results  Component Value Date   SARSCOV2NAA NEGATIVE 06/28/2020   [place] Not Detected 12/17/2018    CBC: Recent Labs  Lab 07/06/20 0750 07/07/20 0524 07/08/20 0552 07/09/20 0503 07/10/20 0338  WBC 20.1* 18.8* 15.3* 15.1* 13.8*  NEUTROABS  --   --   --   --  11.0*  HGB 10.2* 10.6* 9.7* 9.3* 9.0*  HCT 30.1* 31.4* 28.5* 28.1* 28.2*  MCV 91.8 91.8 92.2 93.0 93.7  PLT 431* 479* 458* 473* 469*   Cardiac Enzymes: No results for input(s): CKTOTAL, CKMB, CKMBINDEX, TROPONINI in the last 168 hours. BNP (last 3 results) No results for input(s): PROBNP in the last 8760 hours. CBG: No results for input(s): GLUCAP in the last 168 hours. D-Dimer: No results for input(s): DDIMER in the last 72 hours. Hgb A1c: No results for input(s): HGBA1C in the last 72 hours. Lipid Profile: No  results for input(s): CHOL, HDL, LDLCALC, TRIG, CHOLHDL, LDLDIRECT in the last 72 hours. Thyroid function studies: No results for input(s): TSH, T4TOTAL, T3FREE, THYROIDAB in the last 72 hours.  Invalid input(s): FREET3 Anemia work up: No results for input(s): VITAMINB12, FOLATE, FERRITIN, TIBC, IRON, RETICCTPCT in the last 72 hours. Sepsis Labs: Recent Labs  Lab 07/07/20 0524 07/08/20 0552 07/09/20 0503 07/10/20 0338  PROCALCITON  --  0.12 0.14  --   WBC 18.8* 15.3* 15.1* 13.8*   Microbiology No results found for this or any previous visit (from the past 240 hour(s)).   Medications:    alteplase (TPA) for intrapleural administration  10 mg Intrapleural  Q12H   And   pulmozyme (DORNASE) for intrapleural administration  5 mg Intrapleural Q12H   calcium carbonate  1 tablet Oral TID WC   guaiFENesin  1,200 mg Oral BID   nicotine  14 mg Transdermal Daily   Continuous Infusions:  ampicillin-sulbactam (UNASYN) IV 3 g (07/11/20 0535)      LOS: 2 days   Charlynne Cousins  [facility]  07/11/2020, 10:14 AM

## 2020-07-11 NOTE — Progress Notes (Signed)
   NAME:  Luis Haney, MRN:  [id number], DOB:  28-Aug-1950, LOS: 2 ADMISSION DATE:  07/09/2020, CONSULTATION DATE: 07/10/2020 REFERRING MD: Dr. Aileen Fass, CHIEF COMPLAINT: Empyema  Brief History:  Patient with empyema transferred from [facility] for loculated pleural effusion  History of Present Illness:  Was initially admitted for right-sided chest pain, found to have pleural fluid collection for which he had a chest tube placed.  Cultures showing Streptococcus intermedius sensitive to Unasyn  Past Medical History:  Loculated pleural effusion Smoker Probable obstructive lung disease Chronic back pain for which he had surgery in the past  Significant Hospital Events:  Chest tube in place with empyema, loculated effusion Fibrinolytic administration 12/18  Consults:  Cardiothoracic surgery Pulmonary  Procedures:  Chest tube placement Fibrinolytic administration 12/18>>  Significant Diagnostic Tests:  Pleural fluid consistent with empyema with strep intermedius and glucose of 20  Micro Data:  Strep intermedius and pleural fluid 12/6 Blood cultures negative  Antimicrobials:  Day 14 of Unasyn  Interim History / Subjective:  Feeling relatively well Denies pain or discomfort  Objective   Blood pressure (!) 100/54, pulse 88, temperature 98.6 F (37 C), temperature source Oral, resp. rate 20, height 6\' 1"  (1.854 m), weight 90.5 kg, SpO2 95 %.        Intake/Output Summary (Last 24 hours) at 07/11/2020 6811 Last data filed at 07/11/2020 0700 Gross per 24 hour  Intake 1020 ml  Output 1725 ml  Net -705 ml   Filed Weights   07/09/20 2211 07/11/20 0400  Weight: 92.2 kg 90.5 kg    Examination: General: Elderly, does not appear to be in distress HENT: Moist oral mucosa Lungs: Decreased air entry at right base Cardiovascular: S1-S2 appreciated Abdomen: Bowel sounds appreciated Extremities: No clubbing, no edema Neuro: Alert and oriented x3 GU: [place] [facility] Problem list     Assessment & Plan:  Right-sided empyema Loculated pleural effusion Chronic obstructive pulmonary disease/emphysema  Plan for fibrinolytic administration for 3 days -About 445 cc effluent following initial administration  Right upper lobe nodule -Needs follow-up -Pet scan as outpatient  Active smoker -Smoking cessation counseling  Bronchodilators  Ancillary been able to transition to p.o. Augmentin as long as not having fevers and leukocytosis is stable  We will continue to follow  Follow-up chest x-ray today  Best practice (evaluated daily)  Diet: Regular diet Pain/Anxiety/Delirium protocol (if indicated):: Tylenol for pain VAP protocol (if indicated): Not indicated Mobility: As tolerated Disposition: Medical floor  Sherrilyn Rist, MD [place] [facility] Pager: [phone]

## 2020-07-11 NOTE — Procedures (Signed)
Procedure: instillation of pleural fibrinolytics Indication: loculated pleural effusion Description: 10mg  of tPA in 30cc of saline and 5mg  of dornase in 30cc of sterile water were injected into pleural space using existing pleural catheter.  30cc of saline were used to flush out dead space after.  Catheter will be clamped for 1 hour and then back to suction. Complications: none immediate  Tolerated well  Sherrilyn Rist MD

## 2020-07-11 NOTE — Progress Notes (Signed)
[facility] DAILY PROGRESS NOTE                   [address]            ,[place] [id number]          [phone]       Total Length of Stay:  LOS: 2 days   Subjective: Feels well today   Objective: Vital signs in last 24 hours: Temp:  [98.2 F (36.8 C)-98.8 F (37.1 C)] 98.6 F (37 C) (12/19 1045) Pulse Rate:  [80-88] 85 (12/19 1045) Cardiac Rhythm: Supraventricular tachycardia (12/19 0915) Resp:  [16-20] 16 (12/19 1045) BP: (100-114)/(54-65) 109/59 (12/19 1045) SpO2:  [93 %-95 %] 93 % (12/19 1045) Weight:  [90.5 kg] 90.5 kg (12/19 0400)  Filed Weights   07/09/20 2211 07/11/20 0400  Weight: 92.2 kg 90.5 kg    Weight change: -1.633 kg   Hemodynamic parameters for last 24 hours:    Intake/Output from previous day: 12/18 0701 - 12/19 0700 In: 1020 [P.O.:820; IV Piggyback:200] Out: 9924 [Urine:1250; Chest Tube:475]  Intake/Output this shift: Total I/O In: 600 [P.O.:600] Out: -   Current Meds: Scheduled Meds: . alteplase (TPA) for intrapleural administration  10 mg Intrapleural Q12H   And  . pulmozyme (DORNASE) for intrapleural administration  5 mg Intrapleural Q12H  . calcium carbonate  1 tablet Oral TID WC  . guaiFENesin  1,200 mg Oral BID  . nicotine  14 mg Transdermal Daily   Continuous Infusions: . ampicillin-sulbactam (UNASYN) IV 3 g (07/11/20 1254)   PRN Meds:.acetaminophen **OR** acetaminophen  General appearance: alert, cooperative and no distress Neurologic: intact Heart: regular rate and rhythm, S1, S2 normal, no murmur, click, rub or gallop Lungs: diminished breath sounds RLL Abdomen: soft, non-tender; bowel sounds normal; no masses,  no organomegaly Extremities: extremities normal, atraumatic, no cyanosis or edema and Homans sign is negative, no sign of DVT Wound: no air leak , pulrunet materila still drining   Lab Results: CBC: Recent Labs    07/09/20 0503 07/10/20 0338  WBC 15.1* 13.8*  HGB 9.3* 9.0*  HCT 28.1* 28.2*   PLT 473* 469*   BMET:  Recent Labs    07/09/20 0503 07/10/20 0338  NA 137 137  K 3.7 3.7  CL 104 102  CO2 25 24  GLUCOSE 110* 103*  BUN 14 12  CREATININE 0.90 1.01  CALCIUM 7.6* 7.8*    CMET: Lab Results  Component Value Date   WBC 13.8 (H) 07/10/2020   HGB 9.0 (L) 07/10/2020   HCT 28.2 (L) 07/10/2020   PLT 469 (H) 07/10/2020   GLUCOSE 103 (H) 07/10/2020   ALT 23 07/10/2020   AST 25 07/10/2020   NA 137 07/10/2020   K 3.7 07/10/2020   CL 102 07/10/2020   CREATININE 1.01 07/10/2020   BUN 12 07/10/2020   CO2 24 07/10/2020   TSH 2.960 06/28/2020   INR 1.4 (H) 06/28/2020      PT/INR: No results for input(s): LABPROT, INR in the last 72 hours. Radiology: DG CHEST PORT 1 VIEW  Result Date: 07/11/2020 CLINICAL DATA:  Pleural effusion. EXAM: PORTABLE CHEST 1 VIEW COMPARISON:  July 10, 2020 FINDINGS: The right chest tube remains in place. Fluid in the right fissure is less rounded/smaller in the interval. Overall, the right pleural effusion may be slightly smaller. Opacity associated with the effusion may be atelectasis. The cardiomediastinal silhouette is stable. The left lung is clear. No pneumothorax. IMPRESSION: The right  chest tube remains in place. The right pleural effusion appears smaller in the interval. Possible associated atelectasis. Electronically Signed   By: Dorise Bullion III M.D   On: 07/11/2020 12:53   Appearance of chest xray - especially the degree of fluid in the minor fissure is decreased    I have independently reviewed the above  cath films and reviewed the findings with the  patient .    Assessment/Plan: With improvement in chest xray and drainage , mat not need thoracostomy/decortication    Grace Isaac 07/11/2020 1:02 PM

## 2020-07-12 ENCOUNTER — Inpatient Hospital Stay (HOSPITAL_COMMUNITY): Payer: Medicare HMO

## 2020-07-12 MED ORDER — SODIUM CHLORIDE 0.9 % IV SOLN
INTRAVENOUS | Status: DC | PRN
  Administered 2020-07-12: 13:00:00 1000 mL via INTRAVENOUS

## 2020-07-12 NOTE — Procedures (Addendum)
Pleural Fibrinolytic Administration Procedure   Luis Haney [id number] 11/14/1950  07/12/2020 11:15 AM  Procedure-instillation of pleural fibrinolytics Indication- Right sided loculated pleural effusion  Anesthesia- none Time Out- verified patient identification, procedure, site/side, correct patient position, special equipment available, medications/allergies/relevant history reviewed, required imaging and test results available.  Sterile Technique- hand hygiene, gloves  Description- Existing pleural catheter cleaned and accessed in sterile manner. 10mg  tPA in 88ml saline and 5mg  dornase in 58ml sterile water were injected into the right pleural space using the existing right sided pleural catheter. Catheter to remain clamped for 1 hour and then back to suction.  Complications- no immediate complications, the patient tolerated well      Eliseo Gum MSN, AGACNP-BC [place] [phone] If no answer, [phone] 07/12/2020, 11:28 AM

## 2020-07-12 NOTE — Evaluation (Signed)
Physical Therapy Evaluation Patient Details Name: Luis Haney MRN: [id number] DOB: 02/08/1951 Today's Date: 07/12/2020   History of Present Illness  Pt is a 69 y.o. M with significant PMH of tobacco abuse and COPD who was admitted on 06/28/2020 at [facility] to have a right empyema, CT placed 07/01/2020, Cultures showing Streptococcus intermedius sensitive to Unasyn. Transferred to [facility]. Plan for fibrinolytic administration for 3 days.  Clinical Impression  Prior to admission, pt lives with his spouse and is independent. Pt presents with decreased functional mobility secondary to decreased cardiopulmonary endurance and mild balance deficits. Ambulating 350 feet with no assistive device at a supervision level; SpO2 92-96% on RA, HR stable throughout. Will benefit from promoting mobilization acutely. Do not anticipate need for PT follow up.     Follow Up Recommendations No PT follow up;Supervision for mobility/OOB    Equipment Recommendations  None recommended by PT    Recommendations for Other Services       Precautions / Restrictions Precautions Precautions: Fall;Other (comment) Precaution Comments: chest tube Restrictions Weight Bearing Restrictions: No      Mobility  Bed Mobility               General bed mobility comments: OOB in chair    Transfers Overall transfer level: Needs assistance Equipment used: None Transfers: Sit to/from Stand Sit to Stand: Supervision            Ambulation/Gait Ambulation/Gait assistance: Supervision Gait Distance (Feet): 350 Feet Assistive device: None Gait Pattern/deviations: Step-through pattern;Decreased stride length Gait velocity: decreased   General Gait Details: Slower pace, mild instability, supervision for safety  Stairs            Wheelchair Mobility    Modified Rankin (Stroke Patients Only)       Balance Overall balance assessment: Mild deficits observed, not formally tested                                            Pertinent Vitals/Pain Pain Assessment: No/denies pain    Home Living Family/patient expects to be discharged to:: Private residence Living Arrangements: Spouse/significant other Available Help at Discharge: Family Type of Home: House Home Access: Stairs to enter   Technical brewer of Steps: 5 Home Layout: One level        Prior Function Level of Independence: Independent         Comments: Retired, commuunity ambulatory, still drives     Journalist, newspaper        Extremity/Trunk Assessment   Upper Extremity Assessment Upper Extremity Assessment: Overall WFL for tasks assessed    Lower Extremity Assessment Lower Extremity Assessment: Overall WFL for tasks assessed       Communication   Communication: No difficulties  Cognition Arousal/Alertness: Awake/alert Behavior During Therapy: WFL for tasks assessed/performed Overall Cognitive Status: Within Functional Limits for tasks assessed                                        General Comments      Exercises     Assessment/Plan    PT Assessment Patient needs continued PT services  PT Problem List Decreased strength;Decreased activity tolerance;Decreased balance;Decreased mobility       PT Treatment Interventions Gait training;Stair training;Functional mobility training;Therapeutic exercise;Therapeutic activities;Balance training;Patient/family education  PT Goals (Current goals can be found in the Care Plan section)  Acute Rehab PT Goals Patient Stated Goal: go home soon PT Goal Formulation: With patient Time For Goal Achievement: 07/26/20 Potential to Achieve Goals: Good    Frequency Min 3X/week   Barriers to discharge        Co-evaluation               AM-PAC PT "6 Clicks" Mobility  Outcome Measure Help needed turning from your back to your side while in a flat bed without using bedrails?: None Help needed moving from lying on  your back to sitting on the side of a flat bed without using bedrails?: None Help needed moving to and from a bed to a chair (including a wheelchair)?: None Help needed standing up from a chair using your arms (e.g., wheelchair or bedside chair)?: None Help needed to walk in hospital room?: None Help needed climbing 3-5 steps with a railing? : A Little 6 Click Score: 23    End of Session   Activity Tolerance: Patient tolerated treatment well Patient left: in chair;with call bell/phone within reach;with family/visitor present Nurse Communication: Mobility status PT Visit Diagnosis: Unsteadiness on feet (R26.81)    Time: 3748-2707 PT Time Calculation (min) (ACUTE ONLY): 16 min   Charges:   PT Evaluation $PT Eval Moderate Complexity: 1 Mod          Wyona Almas, PT, DPT Acute Rehabilitation Services Pager [phone] Office [phone]   Deno Etienne 07/12/2020, 5:03 PM

## 2020-07-12 NOTE — Progress Notes (Addendum)
PT Cancellation Note  Patient Details Name: Luis Haney MRN: [id number] DOB: [birth year]   Cancelled Treatment:    Reason Eval/Treat Not Completed: Patient not medically ready Discussed with RN, pt s/p pleural fibrinolytic administration procedure. Will await catheter being unclamped prior to PT evaluation.  Wyona Almas, PT, DPT Acute Rehabilitation Services Pager [phone] Office [phone]       Deno Etienne 07/12/2020, 12:08 PM

## 2020-07-12 NOTE — Progress Notes (Signed)
[facility] PROGRESS NOTE    Progress Note  Luis Haney  CZY:[id number] DOB: 1950/09/18 DOA: 07/09/2020 PCP: [name]     Brief Narrative:   Luis Haney is an 69 y.o. male past medical history significant for tobacco abuse and COPD admitted on 06/28/2020 at [facility] regional find to have a right-sided empyema chest tube was placed it grew staph intermedius sensitive to Unasyn patient was transferred to [place] might need large chest tube streptokinase versus decortication.  Cardiothoracic surgery reevaluated on 07/08/2020 he was found to spiculated mass 10 mm in the right upper lobe.  Assessment/Plan:   Severe sepsis with pneumonia right-sided empyema status post chest tube: Pulmonary and critical care was consulted chest tube was placed on 07/01/2020 Cultures grew strep intermedius 12-/12/2019 sensitive to Unasyn.  Pulmonary critical care was consulted streptokinase being given through tube, about 500 cc of pus came out in 24-hour period. Chest x-ray continues to improve, he will need antibiotic regimen for 3 to 4 weeks further management per pulmonary. Wean to room air out of bed to chair continues into spirometry.  Spiculated 14 mm pulmonary nodule on right upper lobe: Pulmonary Dr. Raul Del is aware. Patient scheduled for PET scan once acute issues have resolved. Etiology was negative for malignancy.  Acute respiratory failure with hypoxia: Likely due to pneumonia and empyema now resolved.  Acute kidney injury: Likely prerenal azotemia resolved with IV fluids.  COPD not in exacerbation: Appears stable continue inhalers.  Normocytic anemia: Appears to be stable follow-up with PCP as an outpatient.   DVT prophylaxis: Lovenox Family Communication:none Status is: Inpatient  Remains inpatient appropriate because:Hemodynamically unstable   Dispo: The patient is from: Home              Anticipated d/c is to: Home              Anticipated  d/c date is: 2 days              Patient currently is not medically stable to d/c.    Code Status:     Code Status Orders  (From admission, onward)         Start     Ordered   07/09/20 2333  Full code  Continuous        07/09/20 2333        Code Status History    Date Active Date Inactive Code Status Order ID Comments User Context   06/28/2020 1353 07/09/2020 2200 Full Code [id number]  CoxBriant Cedar, DO ED   Advance Care Planning Activity        IV Access:    Peripheral IV   Procedures and diagnostic studies:   DG Chest Port 1 View  Result Date: 07/12/2020 CLINICAL DATA:  Follow-up right chest tube EXAM: PORTABLE CHEST 1 VIEW COMPARISON:  07/11/2020 FINDINGS: Cardiac shadow is stable left lung remains clear. Right-sided effusion is noted laterally and also within the minor fissure stable from the prior study. No definitive pneumothorax is seen right-sided pigtail catheter remains in satisfactory position. Postsurgical changes in the cervical spine are noted. IMPRESSION: Stable right-sided effusion with chest tube in place. No pneumothorax is seen. Electronically Signed   By: Inez Catalina M.D.   On: 07/12/2020 08:25   DG CHEST PORT 1 VIEW  Result Date: 07/11/2020 CLINICAL DATA:  Pleural effusion. EXAM: PORTABLE CHEST 1 VIEW COMPARISON:  July 10, 2020 FINDINGS: The right chest tube remains in place. Fluid in the right  fissure is less rounded/smaller in the interval. Overall, the right pleural effusion may be slightly smaller. Opacity associated with the effusion may be atelectasis. The cardiomediastinal silhouette is stable. The left lung is clear. No pneumothorax. IMPRESSION: The right chest tube remains in place. The right pleural effusion appears smaller in the interval. Possible associated atelectasis. Electronically Signed   By: Dorise Bullion III M.D   On: 07/11/2020 12:53     Medical Consultants:    None.  Anti-Infectives:   unasyn  Subjective:     Luis Haney no complaints want to go home  Objective:    Vitals:   07/12/20 0002 07/12/20 0445 07/12/20 0510 07/12/20 0728  BP:  (!) 88/47 100/68 108/66  Pulse:  84 85 81  Resp:  18  18  Temp:  98.7 F (37.1 C)  98.2 F (36.8 C)  TempSrc:  Oral  Oral  SpO2:  92%  94%  Weight: 89 kg     Height:       SpO2: 94 %   Intake/Output Summary (Last 24 hours) at 07/12/2020 0947 Last data filed at 07/12/2020 0814 Gross per 24 hour  Intake 1820 ml  Output 3251 ml  Net -1431 ml   Filed Weights   07/09/20 2211 07/11/20 0400 07/12/20 0002  Weight: 92.2 kg 90.5 kg 89 kg    Exam: General exam: In no acute distress. Respiratory system: Good air movement and clear to auscultation, chest tube in place. Cardiovascular system: S1 & S2 heard, RRR. No JVD. Gastrointestinal system: Abdomen is nondistended, soft and nontender.  Extremities: No pedal edema. Skin: No rashes, lesions or ulcers Psychiatry: Judgement and insight appear normal. Mood & affect appropriate.  Data Reviewed:    Labs: Basic Metabolic Panel: Recent Labs  Lab 07/06/20 0750 07/07/20 0524 07/08/20 0552 07/09/20 0503 07/10/20 0338  NA 136 138 138 137 137  K 3.7 3.7 3.7 3.7 3.7  CL 100 101 104 104 102  CO2 26 27 25 25 24   GLUCOSE 109* 109* 96 110* 103*  BUN 16 15 15 14 12   CREATININE 0.92 1.00 0.92 0.90 1.01  CALCIUM 7.4* 7.7* 7.5* 7.6* 7.8*   GFR Estimated Creatinine Clearance: 75.8 mL/min (by C-G formula based on SCr of 1.01 mg/dL). Liver Function Tests: Recent Labs  Lab 07/10/20 0338  AST 25  ALT 23  ALKPHOS 37*  BILITOT 0.8  PROT 5.1*  ALBUMIN 1.7*   No results for input(s): LIPASE, AMYLASE in the last 168 hours. No results for input(s): AMMONIA in the last 168 hours. Coagulation profile No results for input(s): INR, PROTIME in the last 168 hours. COVID-19 Labs  No results for input(s): DDIMER, FERRITIN, LDH, CRP in the last 72 hours.  Lab Results  Component Value Date    SARSCOV2NAA NEGATIVE 06/28/2020   [place] Not Detected 12/17/2018    CBC: Recent Labs  Lab 07/06/20 0750 07/07/20 0524 07/08/20 0552 07/09/20 0503 07/10/20 0338  WBC 20.1* 18.8* 15.3* 15.1* 13.8*  NEUTROABS  --   --   --   --  11.0*  HGB 10.2* 10.6* 9.7* 9.3* 9.0*  HCT 30.1* 31.4* 28.5* 28.1* 28.2*  MCV 91.8 91.8 92.2 93.0 93.7  PLT 431* 479* 458* 473* 469*   Cardiac Enzymes: No results for input(s): CKTOTAL, CKMB, CKMBINDEX, TROPONINI in the last 168 hours. BNP (last 3 results) No results for input(s): PROBNP in the last 8760 hours. CBG: No results for input(s): GLUCAP in the last 168 hours. D-Dimer: No results for  input(s): DDIMER in the last 72 hours. Hgb A1c: No results for input(s): HGBA1C in the last 72 hours. Lipid Profile: No results for input(s): CHOL, HDL, LDLCALC, TRIG, CHOLHDL, LDLDIRECT in the last 72 hours. Thyroid function studies: No results for input(s): TSH, T4TOTAL, T3FREE, THYROIDAB in the last 72 hours.  Invalid input(s): FREET3 Anemia work up: No results for input(s): VITAMINB12, FOLATE, FERRITIN, TIBC, IRON, RETICCTPCT in the last 72 hours. Sepsis Labs: Recent Labs  Lab 07/07/20 0524 07/08/20 0552 07/09/20 0503 07/10/20 0338  PROCALCITON  --  0.12 0.14  --   WBC 18.8* 15.3* 15.1* 13.8*   Microbiology No results found for this or any previous visit (from the past 240 hour(s)).   Medications:   . alteplase (TPA) for intrapleural administration  10 mg Intrapleural Q12H   And  . pulmozyme (DORNASE) for intrapleural administration  5 mg Intrapleural Q12H  . calcium carbonate  1 tablet Oral TID WC  . guaiFENesin  1,200 mg Oral BID  . nicotine  14 mg Transdermal Daily   Continuous Infusions: . ampicillin-sulbactam (UNASYN) IV 3 g (07/12/20 0514)      LOS: 3 days   Charlynne Cousins  [facility]  07/12/2020, 9:47 AM

## 2020-07-12 NOTE — Progress Notes (Signed)
   07/12/20 0728  Vitals  Temp 98.2 F (36.8 C)  Temp Source Oral  BP 108/66  MAP (mmHg) 79  BP Location Right Arm  BP Method Automatic  Patient Position (if appropriate) Lying  Pulse Rate 81  Pulse Rate Source Monitor  Resp 18  MEWS COLOR  MEWS Score Color Green  Oxygen Therapy  SpO2 94 %  O2 Device Room Air  MEWS Score  MEWS Temp 0  MEWS Systolic 0  MEWS Pulse 0  MEWS RR 0  MEWS LOC 0  MEWS Score 0

## 2020-07-12 NOTE — Progress Notes (Signed)
Nurse called critical care NP to let her know that tube was unclamped at 1245 as discussed earlier.

## 2020-07-12 NOTE — Procedures (Signed)
Pleural Fibrinolytic Administration Procedure   Luis Haney [id number] 03/29/1951    Procedure-instillation of pleural fibrinolytics Indication- Right sided loculated pleural effusion  Anesthesia- none Time Out- verified patient identification, procedure, site/side, correct patient position, special equipment available, medications/allergies/relevant history reviewed, required imaging and test results available.  Sterile Technique- hand hygiene, gloves  Description- Existing pleural catheter cleaned and accessed in sterile manner. 10mg  tPA in 64ml saline and 5mg  dornase in 7ml sterile water were injected into the right pleural space using the existing right sided pleural catheter. Catheter to remain clamped for 1 hour and then back to suction, nursing aware to unclamp and return tube to suction at 10:30pm, contact PCCM for questions Complications- no immediate complications, the patient tolerated well     Otilio Carpen Yarielys Beed, PA-C [facility]  Pager# [phone], if no answer [phone]

## 2020-07-12 NOTE — Progress Notes (Signed)
      [address]       [place] [address]             [phone]    chest xray ordered for today and reviewed  1350 from chest tube - none recorded in but know meds were placed in chest tube Chest xray improved  If continues to improve would not plan operative intervention I have seen and examined Luis Haney and agree with the above assessment  and plan.  Grace Isaac MD Beeper [phone] Office [phone] 07/12/2020 1:20 PM

## 2020-07-12 NOTE — Progress Notes (Signed)
   NAME:  Luis Haney, MRN:  [id number], DOB:  21-Dec-1950, LOS: 3 ADMISSION DATE:  07/09/2020, CONSULTATION DATE: 07/10/2020 REFERRING MD: Dr. Aileen Fass, CHIEF COMPLAINT: Empyema  Brief History:  Patient with empyema transferred from [facility] for loculated pleural effusion  History of Present Illness:  Was initially admitted for right-sided chest pain, found to have pleural fluid collection for which he had a chest tube placed.  Cultures showing Streptococcus intermedius sensitive to Unasyn  Past Medical History:  Loculated pleural effusion Smoker Probable obstructive lung disease Chronic back pain for which he had surgery in the past  Significant Hospital Events:  Chest tube in place with empyema, loculated effusion Fibrinolytic administration 12/18  Consults:  Cardiothoracic surgery Pulmonary  Procedures:  Chest tube placement Fibrinolytic administration 12/18>>  Significant Diagnostic Tests:  Pleural fluid consistent with empyema with strep intermedius and glucose of 20  CXR with some interval improvement of R effusion  Micro Data:  Strep intermedius and pleural fluid 12/6 Blood cultures negative  Antimicrobials:  Day 14 of Unasyn  Interim History / Subjective:  naeo States he wants to go home  Objective   Blood pressure 108/66, pulse 81, temperature 98.2 F (36.8 C), temperature source Oral, resp. rate 18, height 6\' 1"  (1.854 m), weight 89 kg, SpO2 94 %.        Intake/Output Summary (Last 24 hours) at 07/12/2020 0949 Last data filed at 07/12/2020 0814 Gross per 24 hour  Intake 1820 ml  Output 3251 ml  Net -1431 ml   Filed Weights   07/09/20 2211 07/11/20 0400 07/12/20 0002  Weight: 92.2 kg 90.5 kg 89 kg    Examination: General: Elderly M, Left side lying in bed in NAD  HENT: NCAT. Trachea midline. Anicteric sclera. Mmm  Lungs: Symmetrical chest expansion. Diminished R basilar sounds. R sided chest tube  Cardiovascular: rrr s1s2 cap refill < 3  sec Abdomen: Soft round normoactive  Extremities: No cyanosis or clubbing. No obvious large joint deformity  Neuro: AAOx3 following commands  GU: defer   Resolved Hospital Problem list   Acute hypoxic respiratory failure   Assessment & Plan:    Complicated R sided loculated pleural effusion, empyema with strep intermedius  P -Plan for fibrinolytic administration for 3 days -trend CXR  -Unasyn  -Will need long course of abx -- suspect about 4wks.   COPD/Emphysema -CT chest with emphysematous changes. Told by PCP he had COPD a few yrs ago -- no further workup had been completed, not on home BDs Right upper lobe nodule -Needs follow-up -Pet scan as outpatient  Tobacco use disorder  -Smoking cessation counseling -nicotine patch    Best practice (evaluated daily)  Diet: Regular diet Pain/Anxiety/Delirium protocol (if indicated):: Tylenol for pain VAP protocol (if indicated): n/a  Mobility: As tolerated Disposition: Medical floor    Eliseo Gum MSN, AGACNP-BC [employer] [phone] If no answer, [phone] 07/12/2020, 9:49 AM

## 2020-07-12 NOTE — Plan of Care (Signed)
Problem: Activity: Goal: Risk for activity intolerance will decrease Outcome: Completed/Met   Problem: Nutrition: Goal: Adequate nutrition will be maintained Outcome: Completed/Met   Problem: Coping: Goal: Level of anxiety will decrease Outcome: Completed/Met   Problem: Elimination: Goal: Will not experience complications related to bowel motility Outcome: Completed/Met   

## 2020-07-12 NOTE — Progress Notes (Addendum)
   07/12/20 1300  Mobility  Activity Ambulated in hall;Transferred to/from [facility];Transferred:  Bed to chair  Range of Motion/Exercises Active;All extremities  Distance Ambulated (ft) 20 ft  Mobility Response Tolerated well  Mobility performed by Nurse   Patient currently sitting in his chair.  Pt denies pain or SOB at this time. Currently at room air.  Call bell within reach, nurse spoke to patient on importance to call if needs assistance getting up. Pt verbalizes understanding.

## 2020-07-13 ENCOUNTER — Inpatient Hospital Stay (HOSPITAL_COMMUNITY): Payer: Medicare HMO

## 2020-07-13 NOTE — Progress Notes (Signed)
[facility] PROGRESS NOTE    Progress Note  Luis Haney  QJF:[id number] DOB: 12/01/1950 DOA: 07/09/2020 PCP: [name]     Brief Narrative:   Luis Haney is an 69 y.o. male past medical history significant for tobacco abuse and COPD admitted on 06/28/2020 at [facility] regional find to have a right-sided empyema chest tube was placed it grew staph intermedius sensitive to Unasyn patient was transferred to [place] might need large chest tube streptokinase versus decortication.  Cardiothoracic surgery reevaluated on 07/08/2020 he was found to spiculated mass 10 mm in the right upper lobe.  Assessment/Plan:   Severe sepsis with pneumonia right-sided empyema status post chest tube: Pulmonary and critical care was consulted chest tube was placed on 07/01/2020 Cultures grew strep intermedius 12-/12/2019 sensitive to Unasyn.  Pulmonary critical care was consulted today will be his  3 days of streptokinase through chest tube, about 800 cc of pus came out in 24-hour period. I have personally reviewed his chest x-ray and it did show improvement. He has completed 3 days of TPA. Continue to wean to room air out of bed to chair continues into spirometry. Further management to remove chest tube by pulmonary and critical care. He will need a repeat chest x-ray/CT of the chest in 4 to 6 weeks to see resolution of possible mass, which is likely fluid in fissure Patient has been weaned to room air.  Spiculated 14 mm pulmonary nodule on right upper lobe: Pulmonary Dr. Raul Del is aware. Patient scheduled for PET scan once acute issues have resolved. Pleural fluid analysis was negative for malignancy.  Acute respiratory failure with hypoxia: Likely due to pneumonia and empyema now resolved.  Acute kidney injury: Likely prerenal azotemia resolved with IV fluids.  COPD not in exacerbation: Appears stable continue inhalers.  Normocytic anemia: Appears to be stable  follow-up with PCP as an outpatient.   DVT prophylaxis: Lovenox Family Communication:none Status is: Inpatient  Remains inpatient appropriate because:Hemodynamically unstable   Dispo: The patient is from: Home              Anticipated d/c is to: Home              Anticipated d/c date is: 2 days              Patient currently is not medically stable to d/c.    Code Status:     Code Status Orders  (From admission, onward)         Start     Ordered   07/09/20 2333  Full code  Continuous        07/09/20 2333        Code Status History    Date Active Date Inactive Code Status Order ID Comments User Context   06/28/2020 1353 07/09/2020 2200 Full Code [id number]  [facility] Cedar, DO ED   Advance Care Planning Activity        IV Access:    Peripheral IV   Procedures and diagnostic studies:   DG Chest Port 1 View  Result Date: 07/13/2020 CLINICAL DATA:  Chest tube. EXAM: PORTABLE CHEST 1 VIEW COMPARISON:  07/12/2020. FINDINGS: Right chest tube in stable position. Mediastinum hilar structures are stable. Heart size stable. Persistent right mid lung atelectasis versus fluid pseudotumor. Unchanged right pleural effusion. No pneumothorax. Prior cervical spine fusion. IMPRESSION: Right chest tube in stable position. Persistent right mid lung atelectasis versus fluid pseudotumor. Unchanged right pleural effusion. No pneumothorax. Electronically Signed  By: Louisa   On: 07/13/2020 06:04   DG Chest Port 1 View  Result Date: 07/12/2020 CLINICAL DATA:  Follow-up right chest tube EXAM: PORTABLE CHEST 1 VIEW COMPARISON:  07/11/2020 FINDINGS: Cardiac shadow is stable left lung remains clear. Right-sided effusion is noted laterally and also within the minor fissure stable from the prior study. No definitive pneumothorax is seen right-sided pigtail catheter remains in satisfactory position. Postsurgical changes in the cervical spine are noted. IMPRESSION: Stable right-sided  effusion with chest tube in place. No pneumothorax is seen. Electronically Signed   By: Inez Catalina M.D.   On: 07/12/2020 08:25   DG CHEST PORT 1 VIEW  Result Date: 07/11/2020 CLINICAL DATA:  Pleural effusion. EXAM: PORTABLE CHEST 1 VIEW COMPARISON:  July 10, 2020 FINDINGS: The right chest tube remains in place. Fluid in the right fissure is less rounded/smaller in the interval. Overall, the right pleural effusion may be slightly smaller. Opacity associated with the effusion may be atelectasis. The cardiomediastinal silhouette is stable. The left lung is clear. No pneumothorax. IMPRESSION: The right chest tube remains in place. The right pleural effusion appears smaller in the interval. Possible associated atelectasis. Electronically Signed   By: Dorise Bullion III M.D   On: 07/11/2020 12:53     Medical Consultants:    None.  Anti-Infectives:   unasyn  Subjective:    Luis Haney no new complaints.  Objective:    Vitals:   07/12/20 1648 07/12/20 2007 07/13/20 0600 07/13/20 0716  BP: 104/62 100/64 104/78 101/63  Pulse: 88 79 79 77  Resp: 18 18 18 17   Temp: 98.3 F (36.8 C) 98.5 F (36.9 C) 98.3 F (36.8 C) 98.3 F (36.8 C)  TempSrc: Oral Oral Oral Oral  SpO2: 98% 93% 95% 94%  Weight:   87 kg   Height:       SpO2: 94 %   Intake/Output Summary (Last 24 hours) at 07/13/2020 0850 Last data filed at 07/13/2020 6659 Gross per 24 hour  Intake 960 ml  Output 1850 ml  Net -890 ml   Filed Weights   07/11/20 0400 07/12/20 0002 07/13/20 0600  Weight: 90.5 kg 89 kg 87 kg    Exam: General exam: In no acute distress. Respiratory system: Good air movement and clear to auscultation, chest tube in place. Cardiovascular system: S1 & S2 heard, RRR. No JVD. Gastrointestinal system: Abdomen is nondistended, soft and nontender.  Skin: No rashes, lesions or ulcers Psychiatry: Judgement and insight appear normal. Mood & affect appropriate.  Data Reviewed:     Labs: Basic Metabolic Panel: Recent Labs  Lab 07/07/20 0524 07/08/20 0552 07/09/20 0503 07/10/20 0338  NA 138 138 137 137  K 3.7 3.7 3.7 3.7  CL 101 104 104 102  CO2 27 25 25 24   GLUCOSE 109* 96 110* 103*  BUN 15 15 14 12   CREATININE 1.00 0.92 0.90 1.01  CALCIUM 7.7* 7.5* 7.6* 7.8*   GFR Estimated Creatinine Clearance: 75.8 mL/min (by C-G formula based on SCr of 1.01 mg/dL). Liver Function Tests: Recent Labs  Lab 07/10/20 0338  AST 25  ALT 23  ALKPHOS 37*  BILITOT 0.8  PROT 5.1*  ALBUMIN 1.7*   No results for input(s): LIPASE, AMYLASE in the last 168 hours. No results for input(s): AMMONIA in the last 168 hours. Coagulation profile No results for input(s): INR, PROTIME in the last 168 hours. COVID-19 Labs  No results for input(s): DDIMER, FERRITIN, LDH, CRP in the last 72  hours.  Lab Results  Component Value Date   [place] NEGATIVE 06/28/2020   [place] Not Detected 12/17/2018    CBC: Recent Labs  Lab 07/07/20 0524 07/08/20 0552 07/09/20 0503 07/10/20 0338  WBC 18.8* 15.3* 15.1* 13.8*  NEUTROABS  --   --   --  11.0*  HGB 10.6* 9.7* 9.3* 9.0*  HCT 31.4* 28.5* 28.1* 28.2*  MCV 91.8 92.2 93.0 93.7  PLT 479* 458* 473* 469*   Cardiac Enzymes: No results for input(s): CKTOTAL, CKMB, CKMBINDEX, TROPONINI in the last 168 hours. BNP (last 3 results) No results for input(s): PROBNP in the last 8760 hours. CBG: No results for input(s): GLUCAP in the last 168 hours. D-Dimer: No results for input(s): DDIMER in the last 72 hours. Hgb A1c: No results for input(s): HGBA1C in the last 72 hours. Lipid Profile: No results for input(s): CHOL, HDL, LDLCALC, TRIG, CHOLHDL, LDLDIRECT in the last 72 hours. Thyroid function studies: No results for input(s): TSH, T4TOTAL, T3FREE, THYROIDAB in the last 72 hours.  Invalid input(s): FREET3 Anemia work up: No results for input(s): VITAMINB12, FOLATE, FERRITIN, TIBC, IRON, RETICCTPCT in the last 72  hours. Sepsis Labs: Recent Labs  Lab 07/07/20 0524 07/08/20 0552 07/09/20 0503 07/10/20 0338  PROCALCITON  --  0.12 0.14  --   WBC 18.8* 15.3* 15.1* 13.8*   Microbiology No results found for this or any previous visit (from the past 240 hour(s)).   Medications:   . alteplase (TPA) for intrapleural administration  10 mg Intrapleural Q12H   And  . pulmozyme (DORNASE) for intrapleural administration  5 mg Intrapleural Q12H  . calcium carbonate  1 tablet Oral TID WC  . guaiFENesin  1,200 mg Oral BID  . nicotine  14 mg Transdermal Daily   Continuous Infusions: . sodium chloride 1,000 mL (07/12/20 1305)  . ampicillin-sulbactam (UNASYN) IV 3 g (07/13/20 0559)      LOS: 4 days   Charlynne Cousins  [facility]  07/13/2020, 8:50 AM

## 2020-07-13 NOTE — Progress Notes (Signed)
   NAME:  Luis Haney, MRN:  [id number], DOB:  January 16, 1951, LOS: 4 ADMISSION DATE:  07/09/2020, CONSULTATION DATE: 07/10/2020 REFERRING MD: Dr. Aileen Fass, CHIEF COMPLAINT: Empyema  Brief History:  Patient with empyema transferred from [facility] for loculated pleural effusion  History of Present Illness:  Was initially admitted for right-sided chest pain, found to have pleural fluid collection for which he had a chest tube placed.  Cultures showing Streptococcus intermedius sensitive to Unasyn  Past Medical History:  Loculated pleural effusion Smoker Probable obstructive lung disease Chronic back pain for which he had surgery in the past  Significant Hospital Events:  Chest tube in place with empyema, loculated effusion Fibrinolytic administration 12/18 PM 12/19 tpa/dornase x 2 12/20 tpa/dornase x2 12/21 >> pending tpa/dornase administration   Consults:  Cardiothoracic surgery Pulmonary  Procedures:  Chest tube placement 12/6 Fibrinolytic administration 12/18>, 12/19 x2, 12/20 x2, 12/21 x1  Significant Diagnostic Tests:  Pleural fluid consistent with empyema with strep intermedius and glucose of 20  CXR with some interval improvement of R effusion  Micro Data:  Strep intermedius and pleural fluid 12/6 Blood cultures negative  Antimicrobials:  Unasyn 12/7>>   Interim History / Subjective:  NAEO  870ml from chest tube overnight   States he wants to go home, is frustrated with length of stay  Objective   Blood pressure 101/63, pulse 77, temperature 98.3 F (36.8 C), temperature source Oral, resp. rate 17, height 6\' 1"  (1.854 m), weight 87 kg, SpO2 94 %.        Intake/Output Summary (Last 24 hours) at 07/13/2020 0910 Last data filed at 07/13/2020 4503 Gross per 24 hour  Intake 960 ml  Output 1850 ml  Net -890 ml   Filed Weights   07/11/20 0400 07/12/20 0002 07/13/20 0600  Weight: 90.5 kg 89 kg 87 kg    Examination: General: wdwn older adult M, reclined  in bed.  HENT: NCAT pink mm anicteric sclera  Lungs: R sided pleural tube collecting purulent appearing fluid. Symmetrical chest expansion. CTA.  Cardiovascular: rrr s1s2 cap refill < 3 sec 2+ radial pulses  Abdomen: soft ndnt + bowel sounds  Extremities: Symmetrical bulk and tone no cyanosis or clubbing  Neuro: AAOx4 following commands  GU: defer   Resolved Hospital Problem list   Acute hypoxic respiratory failure   Assessment & Plan:   Complicated R sided loculated pleural effusion, empyema with strep intermedius  -pleural tube inserted at [facility] 12/6  P -Plan for fibrinolytic administration for 3 days (last dose will be 12/21 AM) -trend CXR  -Unasyn  -Will need long course of abx -- suspect about 4wks.   COPD/Emphysema -CT chest with emphysematous changes. Told by PCP he had COPD a few yrs ago -- no further workup had been completed, not on home BDs Right upper lobe nodule -Needs follow-up -Pet scan as outpatient  Tobacco use disorder  -continue smoking cessation counseling -nicotine patch    Best practice (evaluated daily)  Diet: Regular diet Pain/Anxiety/Delirium protocol (if indicated):: Tylenol for pain VAP protocol (if indicated): n/a  Mobility: As tolerated Disposition: Medical floor    Eliseo Gum MSN, AGACNP-BC [place] [phone] If no answer, [phone] 07/13/2020, 9:11 AM

## 2020-07-13 NOTE — Progress Notes (Signed)
      [address]       [place] [address]             [phone]                     LOS: 4 days   Subjective: Feels fine   Objective: Vital signs in last 24 hours: Patient Vitals for the past 24 hrs:  BP Temp Temp src Pulse Resp SpO2 Weight  07/13/20 0716 101/63 98.3 F (36.8 C) Oral 77 17 94 % -  07/13/20 0600 104/78 98.3 F (36.8 C) Oral 79 18 95 % 87 kg  07/12/20 2007 100/64 98.5 F (36.9 C) Oral 79 18 93 % -  07/12/20 1648 104/62 98.3 F (36.8 C) Oral 88 18 98 % -    Filed Weights   07/11/20 0400 07/12/20 0002 07/13/20 0600  Weight: 90.5 kg 89 kg 87 kg    Hemodynamic parameters for last 24 hours:    Intake/Output from previous day: 12/20 0701 - 12/21 0700 In: 1080 [P.O.:1080] Out: 1700 [Urine:900; Chest Tube:800] Intake/Output this shift: Total I/O In: 540 [P.O.:480] Out: 150 [Urine:150]  Scheduled Meds: . alteplase (TPA) for intrapleural administration  10 mg Intrapleural Q12H  . calcium carbonate  1 tablet Oral TID WC  . guaiFENesin  1,200 mg Oral BID  . nicotine  14 mg Transdermal Daily   Continuous Infusions: . sodium chloride 1,000 mL (07/12/20 1305)  . ampicillin-sulbactam (UNASYN) IV 3 g (07/13/20 1208)   PRN Meds:.sodium chloride, acetaminophen **OR** acetaminophen  General appearance: alert and cooperative Neurologic: intact Heart: regular rate and rhythm, S1, S2 normal, no murmur, click, rub or gallop Lungs: diminished breath sounds bibasilar Wound: no air leak from ct   Lab Results: CBC:No results for input(s): WBC, HGB, HCT, PLT in the last 72 hours. BMET: No results for input(s): NA, K, CL, CO2, GLUCOSE, BUN, CREATININE, CALCIUM in the last 72 hours.  PT/INR: No results for input(s): LABPROT, INR in the last 72 hours.   Radiology DG Chest Port 1 View  Result Date: 07/13/2020 CLINICAL DATA:  Chest tube. EXAM: PORTABLE CHEST 1 VIEW COMPARISON:  07/12/2020. FINDINGS: Right chest tube in stable position.  Mediastinum hilar structures are stable. Heart size stable. Persistent right mid lung atelectasis versus fluid pseudotumor. Unchanged right pleural effusion. No pneumothorax. Prior cervical spine fusion. IMPRESSION: Right chest tube in stable position. Persistent right mid lung atelectasis versus fluid pseudotumor. Unchanged right pleural effusion. No pneumothorax. Electronically Signed   By: Marcello Moores  Register   On: 07/13/2020 06:04   DG Chest Port 1 View  Result Date: 07/12/2020 CLINICAL DATA:  Follow-up right chest tube EXAM: PORTABLE CHEST 1 VIEW COMPARISON:  07/11/2020 FINDINGS: Cardiac shadow is stable left lung remains clear. Right-sided effusion is noted laterally and also within the minor fissure stable from the prior study. No definitive pneumothorax is seen right-sided pigtail catheter remains in satisfactory position. Postsurgical changes in the cervical spine are noted. IMPRESSION: Stable right-sided effusion with chest tube in place. No pneumothorax is seen. Electronically Signed   By: Inez Catalina M.D.   On: 07/12/2020 08:25     Assessment/Plan: Chest xray has improved  Continue drainage and follow up xray If continued drainage can consider [facility] cat # [id number] for drainage  as out patient    Grace Isaac MD 07/13/2020 12:12 PM

## 2020-07-13 NOTE — Progress Notes (Signed)
Physical Therapy Treatment Patient Details Name: Luis Haney MRN: [id number] DOB: 10-Sep-1950 Today's Date: 07/13/2020    History of Present Illness Pt is a 69 y.o. M with significant PMH of tobacco abuse and COPD who was admitted on 06/28/2020 at [facility] to have a right empyema, CT placed 07/01/2020, Cultures showing Streptococcus intermedius sensitive to Unasyn. Transferred to [facility]. Plan for fibrinolytic administration for 3 days.    PT Comments    Pt progressing towards his physical therapy goals; demonstrating improved activity tolerance and ambulation distance. Pt ambulating 400 feet without physical assist. HR stable in 80's. Will continue to follow acutely to promote mobility.     Follow Up Recommendations  No PT follow up;Supervision for mobility/OOB     Equipment Recommendations  None recommended by PT    Recommendations for Other Services       Precautions / Restrictions Precautions Precautions: Fall;Other (comment) Precaution Comments: chest tube Restrictions Weight Bearing Restrictions: No    Mobility  Bed Mobility Overal bed mobility: Independent                Transfers Overall transfer level: Independent Equipment used: None                Ambulation/Gait Ambulation/Gait assistance: Supervision Gait Distance (Feet): 400 Feet Assistive device: None Gait Pattern/deviations: Step-through pattern;Decreased stride length Gait velocity: decreased   General Gait Details: Slower pace, supervision for safety   Stairs             Wheelchair Mobility    Modified Rankin (Stroke Patients Only)       Balance Overall balance assessment: Mild deficits observed, not formally tested                                          Cognition Arousal/Alertness: Awake/alert Behavior During Therapy: WFL for tasks assessed/performed Overall Cognitive Status: Within Functional Limits for tasks assessed                                         Exercises      General Comments        Pertinent Vitals/Pain Pain Assessment: No/denies pain    Home Living                      Prior Function            PT Goals (current goals can now be found in the care plan section) Acute Rehab PT Goals Patient Stated Goal: go home soon PT Goal Formulation: With patient Time For Goal Achievement: 07/26/20 Potential to Achieve Goals: Good Progress towards PT goals: Progressing toward goals    Frequency    Min 3X/week      PT Plan Current plan remains appropriate    Co-evaluation              AM-PAC PT "6 Clicks" Mobility   Outcome Measure  Help needed turning from your back to your side while in a flat bed without using bedrails?: None Help needed moving from lying on your back to sitting on the side of a flat bed without using bedrails?: None Help needed moving to and from a bed to a chair (including a wheelchair)?: None Help needed standing up from a chair using your  arms (e.g., wheelchair or bedside chair)?: None Help needed to walk in hospital room?: None Help needed climbing 3-5 steps with a railing? : A Little 6 Click Score: 23    End of Session   Activity Tolerance: Patient tolerated treatment well Patient left: with call bell/phone within reach;in bed Nurse Communication: Mobility status PT Visit Diagnosis: Unsteadiness on feet (R26.81)     Time: 1610-9604 PT Time Calculation (min) (ACUTE ONLY): 13 min  Charges:  $Therapeutic Activity: 8-22 mins                     Wyona Almas, PT, DPT Acute Rehabilitation Services Pager [phone] Office [phone]    Deno Etienne 07/13/2020, 2:46 PM

## 2020-07-13 NOTE — Procedures (Signed)
Pleural Fibrinolytic Administration Procedure   Luis Haney [id number] [birth year]  07/13/2020 Time : 09:40  Procedure-instillation of pleural fibrinolytics  Indication- Right-sided loculated pleural effusion Anesthesia- none  Time Out- Verified patient identification, procedure, correct site/side, correct patient positioning, special equipment available, medications/allergies/relevant history reviewed, imaging and test results available. Sterile Technique- hand hygiene, gloves  Description- existing right pleural catheter cleaned and accessed in sterile manner. 10mg  tPA in 55ml saline and 5mg  dornase in 22ml sterile water were injected into the right pleural space using the existing right sided pleural catheter. Pleural catheter clamped following injections of tPA and dornase, and is to remain clamped for 1 hour.  In 1 hour, catheter to be unclamped and returned to suction  Complications- no immediate complications pt tolerated well.    Instructions provided to patient to lay on left side x 30 minutes and right side x 30 minutes.  Instructions provided to RN regarding unclamping pleural catheter in one hour, and returning to suction . Please call if questions.  Eliseo Gum MSN, AGACNP-BC [place] [phone] If no answer, [phone] 07/13/2020, 9:44 AM

## 2020-07-14 ENCOUNTER — Encounter (HOSPITAL_COMMUNITY): Payer: Self-pay | Admitting: Internal Medicine

## 2020-07-14 ENCOUNTER — Other Ambulatory Visit: Payer: Self-pay

## 2020-07-14 ENCOUNTER — Inpatient Hospital Stay (HOSPITAL_COMMUNITY): Payer: Medicare HMO

## 2020-07-14 MED ORDER — GUAIFENESIN ER 600 MG PO TB12: 1200.0000 mg | ORAL_TABLET | Freq: Two times a day (BID) | ORAL | Status: DC | PRN

## 2020-07-14 MED ORDER — LEVOFLOXACIN 500 MG PO TABS
500.0000 mg | ORAL_TABLET | Freq: Every day | ORAL | Status: DC
  Administered 2020-07-14 – 2020-07-15 (×2): 500 mg via ORAL
  Filled 2020-07-14 (×2): qty 1

## 2020-07-14 NOTE — Progress Notes (Signed)
Chest tube removed by rapid nurse as order, patient tolerated procedure well. Will continue to monitor breathing.

## 2020-07-14 NOTE — Care Management Important Message (Signed)
Important Message  Patient Details  Name: KERRON SEDANO MRN: [id number] Date of Birth: 12-Feb-1951   Medicare Important Message Given:  Yes     Shelda Altes 07/14/2020, 9:58 AM

## 2020-07-14 NOTE — TOC Progression Note (Signed)
Transition of Care [facility]) - Progression Note    Patient Details  Name: Luis Haney MRN: [id number] Date of Birth: October 27, 1950  Transition of Care [facility]) CM/SW Contact  Zenon Mayo, RN Phone Number: 07/14/2020, 4:06 PM  Clinical Narrative:    From home with , Acute hypoxemic respiratory failure and sepsis due to-Lobar pneumonia (right LL) Right sided Empyema lung , TOC team will continue to follow.        Expected Discharge Plan and Services                                                 Social Determinants of Health (SDOH) Interventions    Readmission Risk Interventions No flowsheet data found.

## 2020-07-14 NOTE — Progress Notes (Addendum)
      [address]       [place] [address]             [phone]           Subjective: Patient sitting in chair this am. He has no specific complaint  Objective: Vital signs in last 24 hours: Temp:  [97.5 F (36.4 C)-98.3 F (36.8 C)] 97.7 F (36.5 C) (12/22 0729) Pulse Rate:  [75-81] 77 (12/22 0729) Cardiac Rhythm: Normal sinus rhythm (12/21 1930) Resp:  [16-20] 20 (12/22 0729) BP: (95-111)/(57-62) 111/59 (12/22 0729) SpO2:  [93 %-96 %] 95 % (12/22 0729) Weight:  [86.8 kg] 86.8 kg (12/22 0500)     Intake/Output from previous day: 12/21 0701 - 12/22 0700 In: 1661.5 [P.O.:720; I.V.:52.6; IV Piggyback:828.9] Out: 1760 [Urine:1300; Chest Tube:460]   Physical Exam:  Cardiovascular: RRR Pulmonary: Clear to auscultation on left and slightly diminished right base Extremities: No ower extremity edema. Wound: Dressing is clean and dry.  Eschar on where pigtail chest tube previously was placed   Lab Results: CBC:No results for input(s): WBC, HGB, HCT, PLT in the last 72 hours. BMET: No results for input(s): NA, K, CL, CO2, GLUCOSE, BUN, CREATININE, CALCIUM in the last 72 hours.  PT/INR: No results for input(s): LABPROT, INR in the last 72 hours. ABG:  INR: Will add last result for INR, ABG once components are confirmed Will add last 4 CBG results once components are confirmed  Assessment/Plan:  1. CV - SR with HR in the 80's. 2.  Pulmonary - Right pigtail chest tube removed yesterday. 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 3. ID-on Levaquin for Strep Intermedius 4. Management per primary  Ulanda Tackett M ZimmermanPA-C 07/14/2020,8:21 AM [phone]

## 2020-07-14 NOTE — Progress Notes (Addendum)
   NAME:  Luis Haney, MRN:  [id number], DOB:  [birth year], LOS: 5 ADMISSION DATE:  07/09/2020, CONSULTATION DATE: 07/10/2020 REFERRING MD: Dr. Aileen Fass, CHIEF COMPLAINT: Empyema  Brief History:  Patient with empyema transferred from [facility] for loculated pleural effusion  History of Present Illness:  Was initially admitted for right-sided chest pain, found to have pleural fluid collection for which he had a chest tube placed.  Cultures showing Streptococcus intermedius sensitive to Unasyn  Past Medical History:  Loculated pleural effusion Smoker Probable obstructive lung disease Chronic back pain for which he had surgery in the past  Significant Hospital Events:  Chest tube in place with empyema, loculated effusion Fibrinolytic administration 12/18 PM 12/19 tpa/dornase x 2 12/20 tpa/dornase x2 12/21 >> x3  Consults:  Cardiothoracic surgery Pulmonary  Procedures:  Chest tube placement 12/6 Fibrinolytic administration 12/18>, 12/19 x2, 12/20 x2, 12/21 x1  Significant Diagnostic Tests:  Pleural fluid consistent with empyema with strep intermedius and glucose of 20  CXR with some interval improvement of R effusion  Micro Data:  Strep intermedius and pleural fluid 12/6 Blood cultures negative  Antimicrobials:  Unasyn 12/7>>   Interim History / Subjective:  Chest tube without underwater seal in place Plan to remove chest tube Follow-up chest x-ray Objective   Blood pressure (!) 111/59, pulse 77, temperature 97.7 F (36.5 C), temperature source Oral, resp. rate 20, height 6\' 1"  (1.854 m), weight 86.8 kg, SpO2 95 %.        Intake/Output Summary (Last 24 hours) at 07/14/2020 1038 Last data filed at 07/14/2020 0836 Gross per 24 hour  Intake 1481.52 ml  Output 1610 ml  Net -128.48 ml   Filed Weights   07/12/20 0002 07/13/20 0600 07/14/20 0500  Weight: 89 kg 87 kg 86.8 kg    Examination: General: No acute distress at rest HEENT: MM pink/moist no JVD or  lymphadenopathy is appreciated Neuro: Grossly intact focal defect CV:  PULM: Distant throughout Right-sided chest tube connected to Pleur-evac without underwater seal, underwater seal establish per nurse practitioner. o drainage GI: soft, bsx4 active  Extremities: warm/dry,  edema  Skin: no rashes or lesions  Resolved Hospital Problem list   Acute hypoxic respiratory failure   Assessment & Plan:   Complicated R sided loculated pleural effusion, empyema with strep intermedius  pleural tube inserted at [facility] 12/6  P He has completed 3days lytic therapy Chest tube has been without waterseal for 24 hours No further drainage from chest tube No pneumothorax on chest x-ray 07/12/2020 Therefore we will remove chest tube continue to follow serial chest x-ray Most likely need approximately 4 weeks antimicrobial therapy and may need follow-up with CVTS.     COPD/Emphysema CT chest with emphysematous changes. Told by PCP he had COPD a few yrs ago .no further workup had been completed, not on home BDs Right upper lobe nodule Follow-up as an outpatient    Tobacco use disorder  Smoking cessation   Best practice (evaluated daily)  Diet: Regular diet Pain/Anxiety/Delirium protocol (if indicated):: Tylenol for pain VAP protocol (if indicated): n/a  Mobility: As tolerated Disposition: Medical floor    Richardson Landry Alex Mcmanigal ACNP Acute Care Nurse Practitioner [place] Please consult [place] 07/14/2020, 10:38 AM

## 2020-07-14 NOTE — Progress Notes (Signed)
Removed Right CT.  Patient tolerated well.  Per orders PCXR scheduled for am.  RN to call if patient has increased shortness of breath.

## 2020-07-14 NOTE — Progress Notes (Signed)
PROGRESS NOTE    Luis Haney   SJG:[id number]  DOB: May 14, 1951  DOA: 07/09/2020 PCP: [name]   Brief Narrative:  Luis Haney is an 69 y.o. male past medical history significant for tobacco abuse and COPD admitted on 06/28/2020 at [facility] regional find to have a right-sided empyema-  chest tube was placed which grew staph intermedius sensitive to Unasyn- patient was transferred to [facility].  Cardiothoracic surgery reevaluated on 07/08/2020  - He was also found to spiculated mass 10 mm in the right upper lobe.  Subjective: He feels well. No complaints.     Assessment & Plan:   Principal Problem:   Acute hypoxemic respiratory failure and sepsis due to:   Lobar pneumonia (right LL)   Right sided Empyema lung  - rare staph intermedius on pleural fluid culture- blood cultures negatve -  he is currently on Levaquin 500 mg daily for his pneumonia - multiple days of streptokinase given to open up the pigtail cath with good results- per pulmonary notes, the chest tube is to be removed today and a repeat CXR is planned for tomorrow   Active Problems: RUL nodule - outpatient follow up needed for this    Tobacco use disorder, moderate, dependence   COPD - cont to encourage tobacco cessation   Time spent in minutes: 35 DVT prophylaxis: SCDs Start: 07/09/20 2332  Code Status: Full code Family Communication:  Disposition Plan:  Status is: Inpatient  Remains inpatient appropriate because:f/u to see if repeat CXR stable tomorrow.    Dispo: The patient is from: Home              Anticipated d/c is to: Home              Anticipated d/c date is: 2 days              Patient currently is not medically stable to d/c.      Consultants:   PCCM  CT surger Procedures:   Right chest tube/ pigtail cath Antimicrobials:  Anti-infectives (From admission, onward)   Start     Dose/Rate Route Frequency Ordered Stop   07/14/20 1645  levofloxacin (LEVAQUIN)  tablet 500 mg        500 mg Oral Daily 07/14/20 1554 07/21/20 0959   07/10/20 0000  Ampicillin-Sulbactam (UNASYN) 3 g in sodium chloride 0.9 % 100 mL IVPB  Status:  Discontinued        3 g 200 mL/hr over 30 Minutes Intravenous Every 6 hours 07/09/20 2339 07/14/20 1554       Objective: Vitals:   07/13/20 1224 07/13/20 2010 07/14/20 0500 07/14/20 0729  BP: (!) 109/57 99/62 95/61  (!) 111/59  Pulse: 76 81 75 77  Resp: 18 18 16 20   Temp: (!) 97.5 F (36.4 C) 97.7 F (36.5 C) 98.3 F (36.8 C) 97.7 F (36.5 C)  TempSrc: Oral Oral Oral Oral  SpO2: 95% 93% 96% 95%  Weight:   86.8 kg   Height:        Intake/Output Summary (Last 24 hours) at 07/14/2020 1556 Last data filed at 07/14/2020 1316 Gross per 24 hour  Intake 1481.52 ml  Output 1018 ml  Net 463.52 ml   Filed Weights   07/12/20 0002 07/13/20 0600 07/14/20 0500  Weight: 89 kg 87 kg 86.8 kg    Examination: General exam: Appears comfortable  HEENT: PERRLA, oral mucosa moist, no sclera icterus or thrush Respiratory system: Clear to auscultation. Respiratory effort normal. Cardiovascular  system: S1 & S2 heard, RRR.   Gastrointestinal system: Abdomen soft, non-tender, nondistended. Normal bowel sounds. Central nervous system: Alert and oriented. No focal neurological deficits. Extremities: No cyanosis, clubbing or edema Skin: No rashes or ulcers Psychiatry:  Mood & affect appropriate.     Data Reviewed: I have personally reviewed following labs and imaging studies  CBC: Recent Labs  Lab 07/08/20 0552 07/09/20 0503 07/10/20 0338  WBC 15.3* 15.1* 13.8*  NEUTROABS  --   --  11.0*  HGB 9.7* 9.3* 9.0*  HCT 28.5* 28.1* 28.2*  MCV 92.2 93.0 93.7  PLT 458* 473* 342*   Basic Metabolic Panel: Recent Labs  Lab 07/08/20 0552 07/09/20 0503 07/10/20 0338  NA 138 137 137  K 3.7 3.7 3.7  CL 104 104 102  CO2 25 25 24   GLUCOSE 96 110* 103*  BUN 15 14 12   CREATININE 0.92 0.90 1.01  CALCIUM 7.5* 7.6* 7.8*    GFR: Estimated Creatinine Clearance: 75.8 mL/min (by C-G formula based on SCr of 1.01 mg/dL). Liver Function Tests: Recent Labs  Lab 07/10/20 0338  AST 25  ALT 23  ALKPHOS 37*  BILITOT 0.8  PROT 5.1*  ALBUMIN 1.7*   No results for input(s): LIPASE, AMYLASE in the last 168 hours. No results for input(s): AMMONIA in the last 168 hours. Coagulation Profile: No results for input(s): INR, PROTIME in the last 168 hours. Cardiac Enzymes: No results for input(s): CKTOTAL, CKMB, CKMBINDEX, TROPONINI in the last 168 hours. BNP (last 3 results) No results for input(s): PROBNP in the last 8760 hours. HbA1C: No results for input(s): HGBA1C in the last 72 hours. CBG: No results for input(s): GLUCAP in the last 168 hours. Lipid Profile: No results for input(s): CHOL, HDL, LDLCALC, TRIG, CHOLHDL, LDLDIRECT in the last 72 hours. Thyroid Function Tests: No results for input(s): TSH, T4TOTAL, FREET4, T3FREE, THYROIDAB in the last 72 hours. Anemia Panel: No results for input(s): VITAMINB12, FOLATE, FERRITIN, TIBC, IRON, RETICCTPCT in the last 72 hours. Urine analysis:    Component Value Date/Time   COLORURINE YELLOW (A) 06/28/2020 0936   APPEARANCEUR HAZY (A) 06/28/2020 0936   LABSPEC 1.017 06/28/2020 0936   PHURINE 5.0 06/28/2020 0936   GLUCOSEU NEGATIVE 06/28/2020 0936   HGBUR MODERATE (A) 06/28/2020 0936   BILIRUBINUR NEGATIVE 06/28/2020 0936   KETONESUR NEGATIVE 06/28/2020 0936   PROTEINUR NEGATIVE 06/28/2020 0936   NITRITE NEGATIVE 06/28/2020 0936   LEUKOCYTESUR NEGATIVE 06/28/2020 0936   Sepsis Labs: @LABRCNTIP (procalcitonin:4,lacticidven:4) )No results found for this or any previous visit (from the past 240 hour(s)).       Radiology Studies: DG CHEST PORT 1 VIEW  Result Date: 07/14/2020 CLINICAL DATA:  Follow-up pleural effusion EXAM: PORTABLE CHEST 1 VIEW COMPARISON:  07/13/2020 FINDINGS: Pigtail catheter is again noted on the right. Right-sided pleural fluid is  again noted laterally and as well as within the minor fissure. The overall appearance is stable. Mild increased density is noted consistent with fluid in the major fissure. No pneumothorax is seen. Left lung remains clear. Mild vascular congestion is noted. IMPRESSION: Persistent right-sided pleural effusion.  No pneumothorax is noted. Mild vascular congestion. Electronically Signed   By: Inez Catalina M.D.   On: 07/14/2020 08:12   DG Chest Port 1 View  Result Date: 07/13/2020 CLINICAL DATA:  Chest tube. EXAM: PORTABLE CHEST 1 VIEW COMPARISON:  07/12/2020. FINDINGS: Right chest tube in stable position. Mediastinum hilar structures are stable. Heart size stable. Persistent right mid lung atelectasis versus fluid pseudotumor. Unchanged  right pleural effusion. No pneumothorax. Prior cervical spine fusion. IMPRESSION: Right chest tube in stable position. Persistent right mid lung atelectasis versus fluid pseudotumor. Unchanged right pleural effusion. No pneumothorax. Electronically Signed   By: Marcello Moores  Register   On: 07/13/2020 06:04      Scheduled Meds: . calcium carbonate  1 tablet Oral TID WC  . guaiFENesin  1,200 mg Oral BID  . levofloxacin  500 mg Oral Daily  . nicotine  14 mg Transdermal Daily   Continuous Infusions: . sodium chloride 1,000 mL (07/12/20 1305)     LOS: 5 days      Debbe Odea, MD [facility] Pager: [email or web address] 07/14/2020, 3:56 PM

## 2020-07-14 NOTE — Progress Notes (Signed)
Received pt on bed, alert and oriented,no complaints on any chest discomfort, no short of breath noted. Pt verbalized wanting to go home.

## 2020-07-15 ENCOUNTER — Inpatient Hospital Stay (HOSPITAL_COMMUNITY): Payer: Medicare HMO

## 2020-07-15 DIAGNOSIS — Z23 Encounter for immunization: Secondary | ICD-10-CM | POA: Diagnosis not present

## 2020-07-15 DIAGNOSIS — J181 Lobar pneumonia, unspecified organism: Secondary | ICD-10-CM

## 2020-07-15 LAB — BASIC METABOLIC PANEL
Anion gap: 8 (ref 5–15)
BUN: 8 mg/dL (ref 8–23)
CO2: 27 mmol/L (ref 22–32)
Calcium: 8.2 mg/dL — ABNORMAL LOW (ref 8.9–10.3)
Chloride: 102 mmol/L (ref 98–111)
Creatinine, Ser: 1 mg/dL (ref 0.61–1.24)
GFR, Estimated: >60 mL/min (ref >60)
Glucose, Bld: 99 mg/dL (ref 70–99)
Potassium: 3.7 mmol/L (ref 3.5–5.1)
Sodium: 137 mmol/L (ref 135–145)

## 2020-07-15 LAB — CBC
HCT: 26.6 % — ABNORMAL LOW (ref 39.0–52.0)
Hemoglobin: 8.6 g/dL — ABNORMAL LOW (ref 13.0–17.0)
MCH: 30.5 pg (ref 26.0–34.0)
MCHC: 32.3 g/dL (ref 30.0–36.0)
MCV: 94.3 fL (ref 80.0–100.0)
Platelets: 399 10*3/uL (ref 150–400)
RBC: 2.82 MIL/uL — ABNORMAL LOW (ref 4.22–5.81)
RDW: 13.8 % (ref 11.5–15.5)
WBC: 7.4 10*3/uL (ref 4.0–10.5)
nRBC: 0 % (ref 0.0–0.2)

## 2020-07-15 MED ORDER — INFLUENZA VAC A&B SA ADJ QUAD 0.5 ML IM PRSY
0.5000 mL | PREFILLED_SYRINGE | INTRAMUSCULAR | Status: AC
  Administered 2020-07-15: 15:00:00 0.5 mL via INTRAMUSCULAR
  Filled 2020-07-15: qty 0.5

## 2020-07-15 MED ORDER — LEVOFLOXACIN 500 MG PO TABS: 500.0000 mg | ORAL_TABLET | Freq: Every day | ORAL | 0 refills | Status: DC

## 2020-07-15 MED ORDER — GUAIFENESIN ER 600 MG PO TB12: 1200.0000 mg | ORAL_TABLET | Freq: Two times a day (BID) | ORAL | Status: DC | PRN

## 2020-07-15 NOTE — Progress Notes (Signed)
Physical Therapy Treatment and Discharge Patient Details Name: Luis Haney MRN: [id number] DOB: 10-14-1950 Today's Date: 07/15/2020    History of Present Illness Pt is a 69 y.o. M with significant PMH of tobacco abuse and COPD who was admitted on 06/28/2020 at [facility] to have a right empyema, CT placed 07/01/2020, Cultures showing Streptococcus intermedius sensitive to Unasyn. Transferred to [facility]. Plan for fibrinolytic administration for 3 days.    PT Comments    Pt met his goals during his inpatient stay. Ambulating hallway distances with no AD and negotiated 10 steps with a railing without physical difficulty. HR peak 109 bpm. Pt eager to go home. No further acute PT needs. Thank you for this consult.     Follow Up Recommendations  No PT follow up;Supervision for mobility/OOB     Equipment Recommendations  None recommended by PT    Recommendations for Other Services       Precautions / Restrictions Precautions Precautions: Fall Restrictions Weight Bearing Restrictions: No    Mobility  Bed Mobility Overal bed mobility: Independent                Transfers Overall transfer level: Independent Equipment used: None                Ambulation/Gait Ambulation/Gait assistance: Modified independent (Device/Increase time) Gait Distance (Feet): 200 Feet Assistive device: None Gait Pattern/deviations: Step-through pattern;Decreased stride length;Trunk flexed Gait velocity: decreased   General Gait Details: Slower pace, kyphotic and forward head posture, no gross instability noted   Stairs Stairs: Yes Stairs assistance: Modified independent (Device/Increase time) Stair Management: One rail Right Number of Stairs: 10 General stair comments: Step over step pattern   Wheelchair Mobility    Modified Rankin (Stroke Patients Only)       Balance Overall balance assessment: Mild deficits observed, not formally tested                                           Cognition Arousal/Alertness: Awake/alert Behavior During Therapy: WFL for tasks assessed/performed Overall Cognitive Status: Within Functional Limits for tasks assessed                                        Exercises      General Comments        Pertinent Vitals/Pain Pain Assessment: No/denies pain    Home Living                      Prior Function            PT Goals (current goals can now be found in the care plan section) Acute Rehab PT Goals Patient Stated Goal: go home soon PT Goal Formulation: With patient Time For Goal Achievement: 07/26/20 Potential to Achieve Goals: Good Progress towards PT goals: Goals met/education completed, patient discharged from PT    Frequency    Min 3X/week      PT Plan Other (comment) (d/c therapies)    Co-evaluation              AM-PAC PT "6 Clicks" Mobility   Outcome Measure  Help needed turning from your back to your side while in a flat bed without using bedrails?: None Help needed moving from lying on your back to sitting  on the side of a flat bed without using bedrails?: None Help needed moving to and from a bed to a chair (including a wheelchair)?: None Help needed standing up from a chair using your arms (e.g., wheelchair or bedside chair)?: None Help needed to walk in hospital room?: None Help needed climbing 3-5 steps with a railing? : None 6 Click Score: 24    End of Session   Activity Tolerance: Patient tolerated treatment well Patient left: with call bell/phone within reach;in bed Nurse Communication: Mobility status PT Visit Diagnosis: Unsteadiness on feet (R26.81)     Time: 5465-6812 PT Time Calculation (min) (ACUTE ONLY): 15 min  Charges:  $Therapeutic Activity: 8-22 mins                     Luis Haney, PT, DPT Acute Rehabilitation Services Pager [phone] Office [phone]    Luis Haney 07/15/2020, 12:31 PM

## 2020-07-15 NOTE — Discharge Summary (Signed)
Physician Discharge Summary  Luis Haney HBZ:[id number] DOB: Apr 02, 1951 DOA: 07/09/2020  PCP: [name]  Admit date: 07/09/2020 Discharge date: 07/15/2020  Admitted From: home  Disposition:  home   Recommendations for Outpatient Follow-up:  1. Continue to encourage tobacco cessation   Discharge Condition:  stable   CODE STATUS:  Full code   Diet recommendation:  Heart healthy Consultations:  PCCM  CT surgery  Procedures/Studies: . Right sided chest tube   Discharge Diagnoses:  Principal Problem:   Acute hypoxemic respiratory failure ([place]) Active Problems:   Empyema right lung (HCC)   Lung nodule   Tobacco use disorder, moderate, dependence   Brief Summary: Luis Haney is an 69 y.o.malepast medical history significant for tobacco abuse and COPD admitted on 06/28/2020 at [facility] and found to have a right-sided empyema with loculations. A  chest tube was placed which grew staph intermedius sensitive to Unasyn. The patient was transferred to [facility] on 12/17 for a CT surgery eval as the chest tube was not draining sufficiently. Cardiothoracic surgery reevaluated on 07/08/2020  - He was also found to spiculated mass 10 mm in the right upper lobe.  Hospital Course:  Principal Problem:   Acute hypoxemic respiratory failure and sepsis due to:   Right sided Empyema lung  - chest tube placed 12/6 - required multiple days of streptokinase due to loculated effusion and inadequate drainage - rare staph intermedius on pleural fluid culture- blood cultures negatve -  he is currently on Levaquin 500 mg daily for his pneumonia and is recommended by pulmonary to complete 5 more days  - chest tube removed on 12/22- he remains stable and is looking forward to going home- PCCM plans to follow him as outpatient with a CXR in 7-10 days and then an MD visit in 6-8 wks.   Active Problems: RUL nodule - spiculated 10 mm nodule - outpatient follow up  planned for this    Tobacco use disorder    possible COPD - cont to encourage tobacco cessation   Discharge Exam: Vitals:   07/15/20 0834 07/15/20 1124  BP: 108/61 113/72  Pulse: 87 87  Resp: 18 18  Temp:  98.3 F (36.8 C)  SpO2: 95% 99%   Vitals:   07/14/20 2000 07/15/20 0449 07/15/20 0834 07/15/20 1124  BP: 93/72 (!) 99/51 108/61 113/72  Pulse: 98 76 87 87  Resp: 18 18 18 18   Temp: 98.1 F (36.7 C) 98.2 F (36.8 C)  98.3 F (36.8 C)  TempSrc: Oral Oral  Oral  SpO2: 97% 96% 95% 99%  Weight:  86.3 kg    Height:        General: Pt is alert, awake, not in acute distress Cardiovascular: RRR, S1/S2 +, no rubs, no gallops Respiratory: CTA bilaterally, no wheezing, no rhonchi Abdominal: Soft, NT, ND, bowel sounds + Extremities: no edema, no cyanosis   Discharge Instructions  Discharge Instructions    Diet - low sodium heart healthy   Complete by: As directed      Allergies as of 07/15/2020   No Known Allergies     Medication List    STOP taking these medications   Ampicillin-Sulbactam 3 g in sodium chloride 0.9 % 100 mL   nicotine 14 mg/24hr patch Commonly known as: NICODERM CQ - dosed in mg/24 hours     TAKE these medications   calcium carbonate 1250 (500 Ca) MG tablet Commonly known as: OS-CAL - dosed in mg of elemental calcium Take  1 tablet by mouth.   guaiFENesin 600 MG 12 hr tablet Commonly known as: MUCINEX Take 2 tablets (1,200 mg total) by mouth 2 (two) times daily as needed. What changed:   when to take this  reasons to take this   levofloxacin 500 MG tablet Commonly known as: LEVAQUIN Take 1 tablet (500 mg total) by mouth daily. Start taking on: July 16, 2020   MULTI VITAMIN MENS PO Take by mouth.   Potassium 99 MG Tabs Take 99 mg by mouth daily.       No Known Allergies    CT CHEST WO CONTRAST  Result Date: 07/04/2020 CLINICAL DATA:  Chest tube placement 06/28/2020, empyema, persistent cough EXAM: CT CHEST WITHOUT  CONTRAST TECHNIQUE: Multidetector CT imaging of the chest was performed following the standard protocol without IV contrast. COMPARISON:  CT 07/01/2020 FINDINGS: Cardiovascular: Limited assessment in the absence of contrast media. Cardiac size within normal limits. Three-vessel coronary artery atherosclerosis is present. Trace pericardial effusion. Atherosclerotic plaque within the normal caliber aorta. No periaortic stranding or hemorrhage. No hyperdense mural thickening or plaque displacement. Shared origin of the brachiocephalic and left common carotid arteries. Minimal plaque in the proximal great vessels. Central pulmonary arteries are normal caliber. No major venous abnormalities. Mediastinum/Nodes: Persistent scattered subcentimeter low-attenuation mediastinal and hilar nodes, the latter of which are difficult to fully assess in the absence of contrast media. No new enlarged or enlarging nodes. No axillary adenopathy. Scattered secretions again seen in the trachea. Some mild central airways thickening is noted as well. No acute abnormality of the esophagus. Thyroid gland and thoracic inlet are unremarkable. Lungs/Pleura: A right pigtail pleural drain is in place, entering along the anterior axillary line at the third interspace, terminating within a heterogeneous, thick-walled hydropneumothorax. Loculated components are again seen extending into the minor fissure and to a lesser extent the major fissure as well. Redemonstration of a spiculated nodule towards the right lung apex measuring up to 14 mm in size (3/26). An additional cavitary nodule is seen in the medial right lower lobe (3/73) measuring approximately 1.8 x 1.4 cm, increased from 1.6 x 0.0 cm on prior. Background of emphysematous changes. Small bland left pleural effusion. No left pneumothorax. Upper Abdomen: Stable mild symmetric bilateral perinephric stranding, a nonspecific finding which may correlate with advanced age or decreased renal  function. No acute abnormalities present in the visualized portions of the upper abdomen. Musculoskeletal: Exaggerated thoracic kyphosis and mild levocurvature of the thoracic spine. Cervical fusion, incompletely assessed. No acute osseous abnormality or suspicious osseous lesion. No suspicious chest wall masses or lesions. IMPRESSION: 1. Right pigtail pleural drain in place, terminating within a complex, thick-walled hydropneumothorax. Loculated components are again seen extending into the minor fissure and to a lesser extent the major fissure as well. Overall size and extent of this collection is not significantly changed from the comparison. 2. Scattered secretions seen within the airways. 3. Slight interval increase in size of the cavitary nodule with layering air-fluid level in the medial right lower lobe. Could reflect intrapulmonary abscess or cavitary nodule. 4. Stable spiculated nodule towards the right lung apex measuring up to 14 mm in size. 5. Small left pleural effusion and atelectasis. 6. Aortic Atherosclerosis (ICD10-I70.0) 7. Emphysema (ICD10-J43.9). Electronically Signed   By: Lovena Le M.D.   On: 07/04/2020 19:51   CT CHEST WO CONTRAST  Result Date: 07/01/2020 CLINICAL DATA:  Empyema EXAM: CT CHEST WITHOUT CONTRAST TECHNIQUE: Multidetector CT imaging of the chest was performed following the standard  protocol without IV contrast. COMPARISON:  CT dated June 28, 2020 FINDINGS: Cardiovascular: The heart size is stable. Aortic calcifications are noted without evidence for an aneurysm. There are coronary artery calcifications. There is a trace pericardial effusion. Mediastinum/Nodes: --mildly enlarged mediastinal lymph nodes are noted --there are enlarged right hilar lymph nodes -- No axillary lymphadenopathy. -- No supraclavicular lymphadenopathy. -- Normal thyroid gland where visualized. -  Unremarkable esophagus. Lungs/Pleura: The previously demonstrated right-sided loculated pleural  effusion has significantly improved status post right-sided chest tube placement. There remains a moderate-sized loculated right-sided hydropneumothorax. There is a spiculated pulmonary nodule at the right lung apex measuring approximately 1.4 cm (axial series 3, image 32). Emphysematous changes are noted. there is some nodularity along the fissures on the right which is felt to represent trapped fluid as opposed to true pulmonary nodules. There is debris within the trachea. The left lung field is mostly clear aside from a small left-sided pleural effusion with adjacent atelectasis. Upper Abdomen: No acute abnormality identified in the upper abdomen. Musculoskeletal: No chest wall abnormality. No bony spinal canal stenosis. IMPRESSION: 1. Significantly improved appearance of the previously demonstrated right-sided loculated pleural effusion status post right-sided chest tube placement. There remains a moderate-sized loculated right-sided hydropneumothorax. 2. Spiculated 1.4 cm pulmonary nodule at the right lung apex concerning for bronchogenic carcinoma. Pulmonary medicine follow-up is recommended. 3. Enlarged mediastinal and hilar lymph nodes. While these may be reactive, in the setting of a spiculated right upper lobe pulmonary nodule, nodal metastatic disease is not excluded. 4. Small left-sided pleural effusion with adjacent atelectasis. Aortic Atherosclerosis (ICD10-I70.0) and Emphysema (ICD10-J43.9). Electronically Signed   By: Constance Holster M.D.   On: 07/01/2020 19:39   CT CHEST WO CONTRAST  Result Date: 06/28/2020 CLINICAL DATA:  Abnormal chest x-ray, large effusion EXAM: CT CHEST WITHOUT CONTRAST TECHNIQUE: Multidetector CT imaging of the chest was performed following the standard protocol without IV contrast. COMPARISON:  Chest x-ray earlier today FINDINGS: Cardiovascular: Calcifications in the aorta and coronary arteries. Heart is normal size. Aorta is normal caliber. Mediastinum/Nodes: No  mediastinal, hilar, or axillary adenopathy. Trachea and esophagus are unremarkable. Thyroid unremarkable. Lungs/Pleura: Large right pleural effusion loculated laterally. Airspace disease throughout the right lung could reflect compressive atelectasis or pneumonia. This is most confluent in the right lower lobe. Spiculated appearing nodule in the aerated right upper lobe on image 30 measures 10 mm. No confluent opacity or effusion on the left. Upper Abdomen: Imaging into the upper abdomen demonstrates no acute findings. Musculoskeletal: Chest wall soft tissues are unremarkable. No acute bony abnormality. IMPRESSION: Large loculated right pleural effusion with compressive atelectasis versus infiltrate/pneumonia in the right lung, particularly right lower lobe. Spiculated 10 mm right upper lobe nodule. This warrants further investigation. Consider PET CT after clearance of acute symptoms for further evaluation. Coronary artery disease. Aortic Atherosclerosis (ICD10-I70.0). Electronically Signed   By: Rolm Baptise M.D.   On: 06/28/2020 11:47   DG Chest Port 1 View  Result Date: 07/15/2020 CLINICAL DATA:  Follow-up pleural effusion EXAM: PORTABLE CHEST 1 VIEW COMPARISON:  07/14/2020 FINDINGS: Pigtail catheter has been removed on the right. No pneumothorax is noted. Small pseudotumor is again seen in the right middle lobe. Pleural thickening is noted as well as small effusion stable from the prior study. Left lung remains clear. No bony abnormality is noted. IMPRESSION: Status post chest tube removal without pneumothorax. Small right-sided effusion stable in appearance. Electronically Signed   By: Inez Catalina M.D.   On: 07/15/2020 08:05  DG CHEST PORT 1 VIEW  Result Date: 07/14/2020 CLINICAL DATA:  Follow-up pleural effusion EXAM: PORTABLE CHEST 1 VIEW COMPARISON:  07/13/2020 FINDINGS: Pigtail catheter is again noted on the right. Right-sided pleural fluid is again noted laterally and as well as within the  minor fissure. The overall appearance is stable. Mild increased density is noted consistent with fluid in the major fissure. No pneumothorax is seen. Left lung remains clear. Mild vascular congestion is noted. IMPRESSION: Persistent right-sided pleural effusion.  No pneumothorax is noted. Mild vascular congestion. Electronically Signed   By: Inez Catalina M.D.   On: 07/14/2020 08:12   DG Chest Port 1 View  Result Date: 07/13/2020 CLINICAL DATA:  Chest tube. EXAM: PORTABLE CHEST 1 VIEW COMPARISON:  07/12/2020. FINDINGS: Right chest tube in stable position. Mediastinum hilar structures are stable. Heart size stable. Persistent right mid lung atelectasis versus fluid pseudotumor. Unchanged right pleural effusion. No pneumothorax. Prior cervical spine fusion. IMPRESSION: Right chest tube in stable position. Persistent right mid lung atelectasis versus fluid pseudotumor. Unchanged right pleural effusion. No pneumothorax. Electronically Signed   By: Marcello Moores  Register   On: 07/13/2020 06:04   DG Chest Port 1 View  Result Date: 07/12/2020 CLINICAL DATA:  Follow-up right chest tube EXAM: PORTABLE CHEST 1 VIEW COMPARISON:  07/11/2020 FINDINGS: Cardiac shadow is stable left lung remains clear. Right-sided effusion is noted laterally and also within the minor fissure stable from the prior study. No definitive pneumothorax is seen right-sided pigtail catheter remains in satisfactory position. Postsurgical changes in the cervical spine are noted. IMPRESSION: Stable right-sided effusion with chest tube in place. No pneumothorax is seen. Electronically Signed   By: Inez Catalina M.D.   On: 07/12/2020 08:25   DG CHEST PORT 1 VIEW  Result Date: 07/11/2020 CLINICAL DATA:  Pleural effusion. EXAM: PORTABLE CHEST 1 VIEW COMPARISON:  July 10, 2020 FINDINGS: The right chest tube remains in place. Fluid in the right fissure is less rounded/smaller in the interval. Overall, the right pleural effusion may be slightly smaller.  Opacity associated with the effusion may be atelectasis. The cardiomediastinal silhouette is stable. The left lung is clear. No pneumothorax. IMPRESSION: The right chest tube remains in place. The right pleural effusion appears smaller in the interval. Possible associated atelectasis. Electronically Signed   By: Dorise Bullion III M.D   On: 07/11/2020 12:53   DG CHEST PORT 1 VIEW  Result Date: 07/10/2020 CLINICAL DATA:  Pleural effusion. EXAM: PORTABLE CHEST 1 VIEW COMPARISON:  Chest radiograph December 17, 21. FINDINGS: Similar versus slightly increased small right pleural effusion with similar positioning of a right-sided pigtail catheter. Redemonstrated fluid within the right major fissure. Similar scattered opacities in the right lung. Nodules better characterized on prior CT chest. No visible pneumothorax. Similar cardiomediastinal silhouette. IMPRESSION: Similar versus slightly increased right pleural effusion with similar position of the pleural drain. Electronically Signed   By: Margaretha Sheffield MD   On: 07/10/2020 08:05   DG Chest Port 1 View  Result Date: 07/09/2020 CLINICAL DATA:  Follow-up right-sided empyema EXAM: PORTABLE CHEST 1 VIEW COMPARISON:  06/29/2020, 07/04/2020 CT of the chest FINDINGS: Persistent small right pleural collection is noted consistent with the given clinical history of empyema. Pigtail catheter remains in place on the right. Fluid is noted within the minor fissure as well. No pneumothorax is seen. Left lung remains clear. Cardiac shadow is stable. IMPRESSION: Stable right pleural collection with drain in place. No new focal abnormality is seen. Electronically Signed   By:  Inez Catalina M.D.   On: 07/09/2020 08:08   DG Chest Port 1 View  Result Date: 06/29/2020 CLINICAL DATA:  Shortness of breath.  Right chest tube. EXAM: PORTABLE CHEST 1 VIEW COMPARISON:  Chest radiograph and chest CT 06/28/2020. Chest radiograph 07/30/2018 FINDINGS: Pigtail drain has been placed in  the right chest. Markedly improved aeration in the right lung compatible with removal of pleural fluid. There is a rounded opacity in the right lower chest that measures up to 5.0 cm. Residual densities along the periphery of the right lower chest most likely represents pleural-based densities. Negative for right pneumothorax. Left lung is clear without pneumothorax. Heart size is within normal limits and stable. The trachea is midline. Surgical hardware in the lower cervical spine. IMPRESSION: 1. Markedly improved aeration in the right lung following placement of the right chest tube. Negative for pneumothorax. 2. Large rounded opacity in the right lower chest measuring at least 5.0 cm in diameter. This rounded opacity may represent a focus of loculated pleural fluid but a mass lesion cannot be excluded although there was no gross abnormality in this area on the study from January 2020. Recommend continued follow-up and patient will likely need follow-up chest CT. Follow-up chest CT with IV contrast would be ideal but probably not feasible due to patient's renal function. Electronically Signed   By: Markus Daft M.D.   On: 06/29/2020 09:33   DG Chest Port 1 View  Result Date: 06/28/2020 CLINICAL DATA:  Right pleural effusion. EXAM: PORTABLE CHEST 1 VIEW COMPARISON:  Same day. FINDINGS: Stable cardiomediastinal silhouette. Interval placement of pigtail drainage catheter into right hemithorax. Right pleural effusion is significantly decreased as a result. Moderate right hydropneumothorax remains. Left lung is clear. Bony thorax is unremarkable. IMPRESSION: Interval placement of pigtail drainage catheter into right hemithorax. Right pleural effusion is significantly decreased as a result. Moderate right hydropneumothorax remains. Electronically Signed   By: Marijo Conception M.D.   On: 06/28/2020 17:59   DG Chest Port 1 View  Result Date: 06/28/2020 CLINICAL DATA:  Cough, right-sided back pain EXAM: PORTABLE CHEST  1 VIEW COMPARISON:  January 2020 FINDINGS: New large right pleural effusion with associated atelectasis. Left lung is clear. No pneumothorax. Heart size is normal. Mild leftward mediastinal shift. Cervicothoracic fusion. IMPRESSION: New large right pleural effusion with associated atelectasis. Underlying pneumonia is not excluded. Electronically Signed   By: Macy Mis M.D.   On: 06/28/2020 10:09   CT IMAGE GUIDED DRAINAGE BY PERCUTANEOUS CATHETER  Result Date: 06/28/2020 INDICATION: 69 year old male with large loculated right-sided pleural effusion and evidence of bacteremia. Findings are concerning for parapneumonic effusion. EXAM: CT-guided chest tube placement MEDICATIONS: The patient is currently admitted to the hospital and receiving intravenous antibiotics. The antibiotics were administered within an appropriate time frame prior to the initiation of the procedure. ANESTHESIA/SEDATION: Fentanyl 50 mcg IV; Versed 1 mg IV Moderate Sedation Time:  10 minutes The patient was continuously monitored during the procedure by the interventional radiology nurse under my direct supervision. COMPLICATIONS: None immediate. PROCEDURE: Informed written consent was obtained from the patient after a thorough discussion of the procedural risks, benefits and alternatives. All questions were addressed. Maximal Sterile Barrier Technique was utilized including caps, mask, sterile gowns, sterile gloves, sterile drape, hand hygiene and skin antiseptic. A timeout was performed prior to the initiation of the procedure. A planning axial CT scan was performed. The large loculated right-sided pleural effusion was identified. A suitable skin entry site and an anterior intercostal  space was identified. The skin was marked. After sterile prep and drape using chlorhexidine skin prep, local anesthesia was attained by infiltration with 1% lidocaine. A small dermatotomy was made. Using trocar technique, a Cook 14 [place] all-purpose  drainage catheter was advanced over the rib and into the pleural space. The catheter was connected to low wall suction via a Serra pleura vac device. Nearly 2 L of pleural fluid was successfully aspirated in the first few minutes. Follow-up CT imaging demonstrates a well-positioned chest tube. No immediate complication. Tube was secured to the skin with 0 Prolene suture. An air tight sterile dressing was applied. IMPRESSION: Successful placement of a 16 French chest tube into the loculated right pleural effusion. Evacuation yields cloudy yellow fluid. Samples were sent for Gram stain and culture. Electronically Signed   By: Jacqulynn Cadet M.D.   On: 06/28/2020 15:14      The results of significant diagnostics from this hospitalization (including imaging, microbiology, ancillary and laboratory) are listed below for reference.     Microbiology: No results found for this or any previous visit (from the past 240 hour(s)).   Labs: BNP (last 3 results) No results for input(s): BNP in the last 8760 hours. Basic Metabolic Panel: Recent Labs  Lab 07/09/20 0503 07/10/20 0338 07/15/20 0333  NA 137 137 137  K 3.7 3.7 3.7  CL 104 102 102  CO2 25 24 27   GLUCOSE 110* 103* 99  BUN 14 12 8   CREATININE 0.90 1.01 1.00  CALCIUM 7.6* 7.8* 8.2*   Liver Function Tests: Recent Labs  Lab 07/10/20 0338  AST 25  ALT 23  ALKPHOS 37*  BILITOT 0.8  PROT 5.1*  ALBUMIN 1.7*   No results for input(s): LIPASE, AMYLASE in the last 168 hours. No results for input(s): AMMONIA in the last 168 hours. CBC: Recent Labs  Lab 07/09/20 0503 07/10/20 0338 07/15/20 0333  WBC 15.1* 13.8* 7.4  NEUTROABS  --  11.0*  --   HGB 9.3* 9.0* 8.6*  HCT 28.1* 28.2* 26.6*  MCV 93.0 93.7 94.3  PLT 473* 469* 399   Cardiac Enzymes: No results for input(s): CKTOTAL, CKMB, CKMBINDEX, TROPONINI in the last 168 hours. BNP: Invalid input(s): POCBNP CBG: No results for input(s): GLUCAP in the last 168 hours. D-Dimer No  results for input(s): DDIMER in the last 72 hours. Hgb A1c No results for input(s): HGBA1C in the last 72 hours. Lipid Profile No results for input(s): CHOL, HDL, LDLCALC, TRIG, CHOLHDL, LDLDIRECT in the last 72 hours. Thyroid function studies No results for input(s): TSH, T4TOTAL, T3FREE, THYROIDAB in the last 72 hours.  Invalid input(s): FREET3 Anemia work up No results for input(s): VITAMINB12, FOLATE, FERRITIN, TIBC, IRON, RETICCTPCT in the last 72 hours. Urinalysis    Component Value Date/Time   COLORURINE YELLOW (A) 06/28/2020 0936   APPEARANCEUR HAZY (A) 06/28/2020 0936   LABSPEC 1.017 06/28/2020 0936   PHURINE 5.0 06/28/2020 0936   GLUCOSEU NEGATIVE 06/28/2020 0936   HGBUR MODERATE (A) 06/28/2020 0936   BILIRUBINUR NEGATIVE 06/28/2020 0936   KETONESUR NEGATIVE 06/28/2020 0936   PROTEINUR NEGATIVE 06/28/2020 0936   NITRITE NEGATIVE 06/28/2020 0936   LEUKOCYTESUR NEGATIVE 06/28/2020 0936   Sepsis Labs Invalid input(s): PROCALCITONIN,  WBC,  LACTICIDVEN Microbiology No results found for this or any previous visit (from the past 240 hour(s)).   Time coordinating discharge in minutes: 60  SIGNED:   Debbe Odea, MD  [facility] 07/15/2020, 2:32 PM

## 2020-07-15 NOTE — Progress Notes (Signed)
   07/15/20 0941  Mobility  Activity Ambulated in hall (in chair before and after)  Range of Motion/Exercises Active;All extremities  Level of Assistance Independent  Minutes Stood 5 minutes  Minutes Ambulated 5 minutes  Distance Ambulated (ft) 500 ft  Mobility Response Tolerated well  Mobility performed by Mobility specialist  Bed Position Chair  $Mobility charge 1 Mobility

## 2020-07-15 NOTE — Progress Notes (Signed)
   Folllwup cxr today ok EKG with Qtc < 453msec  No change to recommendations from  [place] will sign off    SIGNATURE    Dr. Brand Males, M.D., F.C.C.P,  Pulmonary and Critical Care Medicine Staff Physician, [place] Director - Interstitial Lung Disease  Program  P[facility] [place] [facility] [place], [place], [address]  Pager: [phone], If no answer  OR between  19:00-7:00h: page [phone] Telephone (clinical office): [phone] Telephone (research): [phone]  2:18 PM 07/15/2020

## 2020-07-15 NOTE — Progress Notes (Signed)
Pt received discharge instructions and does not have any additional questions or concerns at this time. Pt encouraged to take his medications as prescribed and follow up scheduled appointments.

## 2020-07-22 DIAGNOSIS — J869 Pyothorax without fistula: Secondary | ICD-10-CM | POA: Diagnosis not present

## 2020-07-22 DIAGNOSIS — Z720 Tobacco use: Secondary | ICD-10-CM | POA: Diagnosis not present

## 2020-07-22 DIAGNOSIS — Z09 Encounter for follow-up examination after completed treatment for conditions other than malignant neoplasm: Secondary | ICD-10-CM | POA: Diagnosis not present

## 2020-07-22 DIAGNOSIS — R918 Other nonspecific abnormal finding of lung field: Secondary | ICD-10-CM | POA: Diagnosis not present

## 2020-07-26 ENCOUNTER — Inpatient Hospital Stay: Payer: Medicare HMO | Admitting: Primary Care

## 2020-07-28 ENCOUNTER — Encounter: Payer: Self-pay | Admitting: Primary Care

## 2020-07-28 ENCOUNTER — Ambulatory Visit: Payer: Medicare HMO | Admitting: Primary Care

## 2020-07-28 ENCOUNTER — Ambulatory Visit
Admission: RE | Admit: 2020-07-28 | Discharge: 2020-07-28 | Disposition: A | Discharge status: Home / Self Care | Payer: Medicare HMO | Source: Ambulatory Visit | Attending: Primary Care | Admitting: Primary Care

## 2020-07-28 ENCOUNTER — Other Ambulatory Visit: Payer: Self-pay | Admitting: Internal Medicine

## 2020-07-28 ENCOUNTER — Other Ambulatory Visit: Payer: Self-pay

## 2020-07-28 VITALS — BP 98/68 | HR 78 | Temp 97.5°F | Ht 73.0 in | Wt 191.4 lb

## 2020-07-28 DIAGNOSIS — J869 Pyothorax without fistula: Secondary | ICD-10-CM | POA: Insufficient documentation

## 2020-07-28 DIAGNOSIS — J9601 Acute respiratory failure with hypoxia: Secondary | ICD-10-CM | POA: Diagnosis not present

## 2020-07-28 DIAGNOSIS — R911 Solitary pulmonary nodule: Secondary | ICD-10-CM

## 2020-07-28 DIAGNOSIS — J438 Other emphysema: Secondary | ICD-10-CM

## 2020-07-28 DIAGNOSIS — F17200 Nicotine dependence, unspecified, uncomplicated: Secondary | ICD-10-CM

## 2020-07-28 DIAGNOSIS — R918 Other nonspecific abnormal finding of lung field: Secondary | ICD-10-CM | POA: Diagnosis not present

## 2020-07-28 DIAGNOSIS — R69 Illness, unspecified: Secondary | ICD-10-CM | POA: Diagnosis not present

## 2020-07-28 DIAGNOSIS — J90 Pleural effusion, not elsewhere classified: Secondary | ICD-10-CM | POA: Diagnosis not present

## 2020-07-28 NOTE — Progress Notes (Signed)
Let patient know he still has partially loculated pleural effusion on right, slightly less than previous. Otherwise lungs are clear.

## 2020-07-28 NOTE — Patient Instructions (Addendum)
Orders: - CXR today re: pleural effusion/emphysema (ordered) - PET scan in 3-4 weeks re: solitary lung nodule > 60mm (ordered) - Pulmonary function testing (ordered)  Follow-up: - February 18th at 11:30am with Dr. Chase Caller

## 2020-07-28 NOTE — Progress Notes (Signed)
@Patient  ID: Luis Haney, male    DOB: January 23, 1951, 70 y.o.   MRN: [id number]  Chief Complaint  Patient presents with  . Hospitalization Follow-up    Discharged 07/15/2020--breathing has improved since discharge. C/o occ dry cough.     Referring provider: Care, [name]  HPI: 70 year old male, current every day smoker.  Past medical history significant for COPD, empyema lung, lobular pneumonia, pleural effusion, acute hypoxic respiratory failure, malnutrition, neutrophilic leukocytosis.  Recently hospitalized in December for empyema with loculated pleural effusion.  He was consulted inpatient by PCCM team/Dr. Ander Slade as well as CT surgery.  Hospital course 06/28/20- 07/15/20 Patient was admitted to [facility] for right-sided chest pain and found to have right sided empyema and loculated pleural fluid collection.  Chest tube placed 06/28/20, cultures grew Streptococcus intermedius sensitive to Unasyn.  He was transferred to [place] on 07/09/2020 for CT surgery eval d/t adequate drainage.  He received fibrinolytics. He was treated with IV unasyn and oral Levaquin x 7 days for his pneumonia.  Chest tube was removed on 07/14/2020. He was also found to have spiculated mass 10 mm right upper lobe. Needs outpatient follow-up CXR and CT chest outpatient as well.  07/28/2020- Interim hx  Patient presents today for hospital follow-up right sided emphysema. He is feeling better. He denies shortness of breath, fever, chest pain/discomfort. He has a dry cough without mucus production. He finished Levaquin course. He is not on home bronchodilators. No formal PFTs on file. CT imaging showed emphysematous changes. He was also found to have spiculated mass 10 mm right upper lobe that needs outpatient follow-up once acute symptoms have cleared. He has a follow-up appointment with Dr. Chase Caller on February 18th   No Known Allergies  Immunization History  Administered Date(s) Administered  .  Fluad Quad(high Dose 65+) 07/15/2020  . Tdap 02/26/2020    Past Medical History:  Diagnosis Date  . COPD (chronic obstructive pulmonary disease) ([place])   . Pleural effusion 06/2020    Tobacco History: Social History   Tobacco Use  Smoking Status Former Smoker  . Packs/day: 2.00  . Years: 30.00  . Pack years: 60.00  . Types: Cigarettes  . Quit date: 06/2020  . Years since quitting: 0.0  Smokeless Tobacco Never Used   Counseling given: Not Answered   Outpatient Medications Prior to Visit  Medication Sig Dispense Refill  . calcium carbonate (OS-CAL - DOSED IN MG OF ELEMENTAL CALCIUM) 1250 (500 Ca) MG tablet Take 1 tablet by mouth.    . Multiple Vitamin (MULTI VITAMIN MENS PO) Take by mouth.    . Potassium 99 MG TABS Take 99 mg by mouth daily.    Marland Kitchen guaiFENesin (MUCINEX) 600 MG 12 hr tablet Take 2 tablets (1,200 mg total) by mouth 2 (two) times daily as needed.    Marland Kitchen levofloxacin (LEVAQUIN) 500 MG tablet Take 1 tablet (500 mg total) by mouth daily. 5 tablet 0   No facility-administered medications prior to visit.   Review of Systems  Review of Systems  Constitutional: Negative for fatigue and fever.  Respiratory: Positive for cough. Negative for chest tightness, shortness of breath and wheezing.        Dry cough  Cardiovascular: Negative for chest pain.   Physical Exam  BP 98/68 (BP Location: Left Arm, Cuff Size: Normal)   Pulse 78   Temp (!) 97.5 F (36.4 C) (Temporal)   Ht 6\' 1"  (1.854 m)   Wt 191 lb 6.4 oz (  86.8 kg)   SpO2 96%   BMI 25.25 kg/m  Physical Exam Constitutional:      Appearance: Normal appearance.  HENT:     Head: Normocephalic and atraumatic.     Mouth/Throat:     Mouth: Mucous membranes are moist.     Pharynx: Oropharynx is clear.  Cardiovascular:     Rate and Rhythm: Normal rate and regular rhythm.  Pulmonary:     Comments: Slightly diminished right base, otherwise clear Musculoskeletal:        General: Normal range of motion.  Skin:     General: Skin is warm and dry.  Neurological:     General: No focal deficit present.     Mental Status: He is alert and oriented to person, place, and time. Mental status is at baseline.  Psychiatric:        Mood and Affect: Mood normal.        Behavior: Behavior normal.        Thought Content: Thought content normal.        Judgment: Judgment normal.      Lab Results:  CBC    Component Value Date/Time   WBC 7.4 07/15/2020 0333   RBC 2.82 (L) 07/15/2020 0333   HGB 8.6 (L) 07/15/2020 0333   HCT 26.6 (L) 07/15/2020 0333   PLT 399 07/15/2020 0333   MCV 94.3 07/15/2020 0333   MCH 30.5 07/15/2020 0333   MCHC 32.3 07/15/2020 0333   RDW 13.8 07/15/2020 0333   LYMPHSABS 1.6 07/10/2020 0338   MONOABS 0.9 07/10/2020 0338   EOSABS 0.1 07/10/2020 0338   BASOSABS 0.1 07/10/2020 0338    BMET    Component Value Date/Time   NA 137 07/15/2020 0333   K 3.7 07/15/2020 0333   CL 102 07/15/2020 0333   CO2 27 07/15/2020 0333   GLUCOSE 99 07/15/2020 0333   BUN 8 07/15/2020 0333   CREATININE 1.00 07/15/2020 0333   CALCIUM 8.2 (L) 07/15/2020 0333   GFRNONAA >60 07/15/2020 0333   GFRAA >60 07/30/2018 0956    BNP No results found for: BNP  ProBNP No results found for: PROBNP  Imaging: DG Chest 2 View  Result Date: 07/28/2020 CLINICAL DATA:  Recent pleural effusion EXAM: CHEST - 2 VIEW COMPARISON:  July 15, 2020 FINDINGS: No pneumothorax. There is a partially loculated pleural effusion at the right base. Fluid tracks into the right minor fissure, less than on previous study. Lungs elsewhere are clear. The heart size and pulmonary vascularity are normal. No adenopathy. Aorta is mildly tortuous but stable. No adenopathy. There is postoperative change in the lower cervical region. IMPRESSION: Partially loculated right pleural effusion. Pleural effusion tracks into the right minor fissure with slightly less fluid in this area compared to previous study. Lungs elsewhere clear. Stable  cardiac silhouette. Electronically Signed   By: Lowella Grip III M.D.   On: 07/28/2020 16:28   CT CHEST WO CONTRAST  Result Date: 07/04/2020 CLINICAL DATA:  Chest tube placement 06/28/2020, empyema, persistent cough EXAM: CT CHEST WITHOUT CONTRAST TECHNIQUE: Multidetector CT imaging of the chest was performed following the standard protocol without IV contrast. COMPARISON:  CT 07/01/2020 FINDINGS: Cardiovascular: Limited assessment in the absence of contrast media. Cardiac size within normal limits. Three-vessel coronary artery atherosclerosis is present. Trace pericardial effusion. Atherosclerotic plaque within the normal caliber aorta. No periaortic stranding or hemorrhage. No hyperdense mural thickening or plaque displacement. Shared origin of the brachiocephalic and left common carotid arteries. Minimal plaque in the  proximal great vessels. Central pulmonary arteries are normal caliber. No major venous abnormalities. Mediastinum/Nodes: Persistent scattered subcentimeter low-attenuation mediastinal and hilar nodes, the latter of which are difficult to fully assess in the absence of contrast media. No new enlarged or enlarging nodes. No axillary adenopathy. Scattered secretions again seen in the trachea. Some mild central airways thickening is noted as well. No acute abnormality of the esophagus. Thyroid gland and thoracic inlet are unremarkable. Lungs/Pleura: A right pigtail pleural drain is in place, entering along the anterior axillary line at the third interspace, terminating within a heterogeneous, thick-walled hydropneumothorax. Loculated components are again seen extending into the minor fissure and to a lesser extent the major fissure as well. Redemonstration of a spiculated nodule towards the right lung apex measuring up to 14 mm in size (3/26). An additional cavitary nodule is seen in the medial right lower lobe (3/73) measuring approximately 1.8 x 1.4 cm, increased from 1.6 x 0.0 cm on prior.  Background of emphysematous changes. Small bland left pleural effusion. No left pneumothorax. Upper Abdomen: Stable mild symmetric bilateral perinephric stranding, a nonspecific finding which may correlate with advanced age or decreased renal function. No acute abnormalities present in the visualized portions of the upper abdomen. Musculoskeletal: Exaggerated thoracic kyphosis and mild levocurvature of the thoracic spine. Cervical fusion, incompletely assessed. No acute osseous abnormality or suspicious osseous lesion. No suspicious chest wall masses or lesions. IMPRESSION: 1. Right pigtail pleural drain in place, terminating within a complex, thick-walled hydropneumothorax. Loculated components are again seen extending into the minor fissure and to a lesser extent the major fissure as well. Overall size and extent of this collection is not significantly changed from the comparison. 2. Scattered secretions seen within the airways. 3. Slight interval increase in size of the cavitary nodule with layering air-fluid level in the medial right lower lobe. Could reflect intrapulmonary abscess or cavitary nodule. 4. Stable spiculated nodule towards the right lung apex measuring up to 14 mm in size. 5. Small left pleural effusion and atelectasis. 6. Aortic Atherosclerosis (ICD10-I70.0) 7. Emphysema (ICD10-J43.9). Electronically Signed   By: Lovena Le M.D.   On: 07/04/2020 19:51   CT CHEST WO CONTRAST  Result Date: 07/01/2020 CLINICAL DATA:  Empyema EXAM: CT CHEST WITHOUT CONTRAST TECHNIQUE: Multidetector CT imaging of the chest was performed following the standard protocol without IV contrast. COMPARISON:  CT dated June 28, 2020 FINDINGS: Cardiovascular: The heart size is stable. Aortic calcifications are noted without evidence for an aneurysm. There are coronary artery calcifications. There is a trace pericardial effusion. Mediastinum/Nodes: --mildly enlarged mediastinal lymph nodes are noted --there are enlarged  right hilar lymph nodes -- No axillary lymphadenopathy. -- No supraclavicular lymphadenopathy. -- Normal thyroid gland where visualized. -  Unremarkable esophagus. Lungs/Pleura: The previously demonstrated right-sided loculated pleural effusion has significantly improved status post right-sided chest tube placement. There remains a moderate-sized loculated right-sided hydropneumothorax. There is a spiculated pulmonary nodule at the right lung apex measuring approximately 1.4 cm (axial series 3, image 32). Emphysematous changes are noted. there is some nodularity along the fissures on the right which is felt to represent trapped fluid as opposed to true pulmonary nodules. There is debris within the trachea. The left lung field is mostly clear aside from a small left-sided pleural effusion with adjacent atelectasis. Upper Abdomen: No acute abnormality identified in the upper abdomen. Musculoskeletal: No chest wall abnormality. No bony spinal canal stenosis. IMPRESSION: 1. Significantly improved appearance of the previously demonstrated right-sided loculated pleural effusion status post right-sided  chest tube placement. There remains a moderate-sized loculated right-sided hydropneumothorax. 2. Spiculated 1.4 cm pulmonary nodule at the right lung apex concerning for bronchogenic carcinoma. Pulmonary medicine follow-up is recommended. 3. Enlarged mediastinal and hilar lymph nodes. While these may be reactive, in the setting of a spiculated right upper lobe pulmonary nodule, nodal metastatic disease is not excluded. 4. Small left-sided pleural effusion with adjacent atelectasis. Aortic Atherosclerosis (ICD10-I70.0) and Emphysema (ICD10-J43.9). Electronically Signed   By: Constance Holster M.D.   On: 07/01/2020 19:39   DG Chest Port 1 View  Result Date: 07/15/2020 CLINICAL DATA:  Follow-up pleural effusion EXAM: PORTABLE CHEST 1 VIEW COMPARISON:  07/14/2020 FINDINGS: Pigtail catheter has been removed on the right. No  pneumothorax is noted. Small pseudotumor is again seen in the right middle lobe. Pleural thickening is noted as well as small effusion stable from the prior study. Left lung remains clear. No bony abnormality is noted. IMPRESSION: Status post chest tube removal without pneumothorax. Small right-sided effusion stable in appearance. Electronically Signed   By: Inez Catalina M.D.   On: 07/15/2020 08:05   DG CHEST PORT 1 VIEW  Result Date: 07/14/2020 CLINICAL DATA:  Follow-up pleural effusion EXAM: PORTABLE CHEST 1 VIEW COMPARISON:  07/13/2020 FINDINGS: Pigtail catheter is again noted on the right. Right-sided pleural fluid is again noted laterally and as well as within the minor fissure. The overall appearance is stable. Mild increased density is noted consistent with fluid in the major fissure. No pneumothorax is seen. Left lung remains clear. Mild vascular congestion is noted. IMPRESSION: Persistent right-sided pleural effusion.  No pneumothorax is noted. Mild vascular congestion. Electronically Signed   By: Inez Catalina M.D.   On: 07/14/2020 08:12   DG Chest Port 1 View  Result Date: 07/13/2020 CLINICAL DATA:  Chest tube. EXAM: PORTABLE CHEST 1 VIEW COMPARISON:  07/12/2020. FINDINGS: Right chest tube in stable position. Mediastinum hilar structures are stable. Heart size stable. Persistent right mid lung atelectasis versus fluid pseudotumor. Unchanged right pleural effusion. No pneumothorax. Prior cervical spine fusion. IMPRESSION: Right chest tube in stable position. Persistent right mid lung atelectasis versus fluid pseudotumor. Unchanged right pleural effusion. No pneumothorax. Electronically Signed   By: Marcello Moores  Register   On: 07/13/2020 06:04   DG Chest Port 1 View  Result Date: 07/12/2020 CLINICAL DATA:  Follow-up right chest tube EXAM: PORTABLE CHEST 1 VIEW COMPARISON:  07/11/2020 FINDINGS: Cardiac shadow is stable left lung remains clear. Right-sided effusion is noted laterally and also within  the minor fissure stable from the prior study. No definitive pneumothorax is seen right-sided pigtail catheter remains in satisfactory position. Postsurgical changes in the cervical spine are noted. IMPRESSION: Stable right-sided effusion with chest tube in place. No pneumothorax is seen. Electronically Signed   By: Inez Catalina M.D.   On: 07/12/2020 08:25   DG CHEST PORT 1 VIEW  Result Date: 07/11/2020 CLINICAL DATA:  Pleural effusion. EXAM: PORTABLE CHEST 1 VIEW COMPARISON:  July 10, 2020 FINDINGS: The right chest tube remains in place. Fluid in the right fissure is less rounded/smaller in the interval. Overall, the right pleural effusion may be slightly smaller. Opacity associated with the effusion may be atelectasis. The cardiomediastinal silhouette is stable. The left lung is clear. No pneumothorax. IMPRESSION: The right chest tube remains in place. The right pleural effusion appears smaller in the interval. Possible associated atelectasis. Electronically Signed   By: Dorise Bullion III M.D   On: 07/11/2020 12:53   DG CHEST PORT 1 VIEW  Result Date: 07/10/2020 CLINICAL DATA:  Pleural effusion. EXAM: PORTABLE CHEST 1 VIEW COMPARISON:  Chest radiograph December 17, 21. FINDINGS: Similar versus slightly increased small right pleural effusion with similar positioning of a right-sided pigtail catheter. Redemonstrated fluid within the right major fissure. Similar scattered opacities in the right lung. Nodules better characterized on prior CT chest. No visible pneumothorax. Similar cardiomediastinal silhouette. IMPRESSION: Similar versus slightly increased right pleural effusion with similar position of the pleural drain. Electronically Signed   By: Margaretha Sheffield MD   On: 07/10/2020 08:05   DG Chest Port 1 View  Result Date: 07/09/2020 CLINICAL DATA:  Follow-up right-sided empyema EXAM: PORTABLE CHEST 1 VIEW COMPARISON:  06/29/2020, 07/04/2020 CT of the chest FINDINGS: Persistent small right  pleural collection is noted consistent with the given clinical history of empyema. Pigtail catheter remains in place on the right. Fluid is noted within the minor fissure as well. No pneumothorax is seen. Left lung remains clear. Cardiac shadow is stable. IMPRESSION: Stable right pleural collection with drain in place. No new focal abnormality is seen. Electronically Signed   By: Inez Catalina M.D.   On: 07/09/2020 08:08     Assessment & Plan:   Empyema lung ([place]) - Hospitalized 06/28/20-07/15/20 for right sided empyema with loculated pleural effusion. S/p chest tube placement which was discontinued on 07/14/20. Treated with IV unasyn and oral levaquin x 7 days. - Patient is feeling well, no complaints other than dry cough without mucus production. Denies fever, chest pain or sob. Repeat CXR today showed partially loculated right pleural effusion, slightly decreased compared to previous. Otherwise lungs clear.  - Fu with Dr. Chase Caller for Feb 18th   Mass of upper lobe of right lung - Spiculated mass 28mm right upper lobe, needs outpatient follow-up once acute symptoms have cleared - Ordered for PET scan in 3-4 weeks   Tobacco use disorder, moderate, dependence - Patient has not smoked since he was hospitalized in December 2021 - Continue to encourage patient abstain from tobacco   Other emphysema ([place]) - Not on bronchodilators, no acute COPD symptoms - Needs baseline PFTs   Martyn Ehrich, NP 07/29/2020

## 2020-07-29 DIAGNOSIS — J438 Other emphysema: Secondary | ICD-10-CM | POA: Insufficient documentation

## 2020-07-29 NOTE — Assessment & Plan Note (Addendum)
-   Hospitalized 06/28/20-07/15/20 for right sided empyema with loculated pleural effusion. S/p chest tube placement which was discontinued on 07/14/20. Treated with IV unasyn and oral levaquin x 7 days. - Patient is feeling well, no complaints other than dry cough without mucus production. Denies fever, chest pain or sob. Repeat CXR today showed partially loculated right pleural effusion, slightly decreased compared to previous. Otherwise lungs clear.  - Fu with Dr. Chase Caller for Feb 18th

## 2020-07-29 NOTE — Assessment & Plan Note (Signed)
-   Patient has not smoked since he was hospitalized in December 2021 - Continue to encourage patient abstain from tobacco

## 2020-07-29 NOTE — Assessment & Plan Note (Signed)
-   Spiculated mass 43mm right upper lobe, needs outpatient follow-up once acute symptoms have cleared - Ordered for PET scan in 3-4 weeks

## 2020-07-29 NOTE — Assessment & Plan Note (Signed)
-   Not on bronchodilators, no acute COPD symptoms - Needs baseline PFTs

## 2020-08-16 ENCOUNTER — Telehealth: Payer: Self-pay

## 2020-08-16 NOTE — Telephone Encounter (Signed)
Patient is aware date/time of covid test.

## 2020-08-19 ENCOUNTER — Other Ambulatory Visit
Admission: RE | Admit: 2020-08-19 | Discharge: 2020-08-19 | Disposition: A | Discharge status: Home / Self Care | Payer: Medicare HMO | Source: Ambulatory Visit | Attending: Primary Care | Admitting: Primary Care

## 2020-08-19 ENCOUNTER — Other Ambulatory Visit: Payer: Self-pay

## 2020-08-19 DIAGNOSIS — Z01812 Encounter for preprocedural laboratory examination: Secondary | ICD-10-CM | POA: Diagnosis not present

## 2020-08-19 DIAGNOSIS — Z20822 Contact with and (suspected) exposure to covid-19: Secondary | ICD-10-CM | POA: Diagnosis not present

## 2020-08-19 LAB — SARS CORONAVIRUS 2 (TAT 6-24 HRS): SARS Coronavirus 2: NEGATIVE

## 2020-08-20 ENCOUNTER — Ambulatory Visit: Payer: Medicare HMO

## 2020-08-23 ENCOUNTER — Ambulatory Visit: Payer: Medicare HMO | Attending: Primary Care

## 2020-08-23 ENCOUNTER — Other Ambulatory Visit: Payer: Self-pay

## 2020-08-23 DIAGNOSIS — J439 Emphysema, unspecified: Secondary | ICD-10-CM | POA: Diagnosis present

## 2020-08-23 DIAGNOSIS — J438 Other emphysema: Secondary | ICD-10-CM

## 2020-08-23 MED ORDER — ALBUTEROL SULFATE (2.5 MG/3ML) 0.083% IN NEBU
2.5000 mg | INHALATION_SOLUTION | Freq: Once | RESPIRATORY_TRACT | Status: AC
  Administered 2020-08-23: 2.5 mg via RESPIRATORY_TRACT
  Filled 2020-08-23: qty 3

## 2020-08-23 MED ORDER — ALBUTEROL SULFATE (2.5 MG/3ML) 0.083% IN NEBU
2.5000 mg | INHALATION_SOLUTION | Freq: Once | RESPIRATORY_TRACT | Status: AC
  Filled 2020-08-23: qty 3

## 2020-08-24 LAB — PULMONARY FUNCTION TEST ARMC ONLY
DL/VA % pred: 77 %
DL/VA: 3.11 ml/min/mmHg/L
DLCO unc % pred: 57 %
DLCO unc: 15.21 ml/min/mmHg
FEF 25-75 Post: 1.76 L/sec
FEF 25-75 Pre: 1.18 L/sec
FEF2575-%Change-Post: 49 %
FEF2575-%Pred-Post: 69 %
FEF2575-%Pred-Pre: 46 %
FEV1-%Change-Post: 10 %
FEV1-%Pred-Post: 65 %
FEV1-%Pred-Pre: 59 %
FEV1-Post: 2.2 L
FEV1-Pre: 1.99 L
FEV1FVC-%Change-Post: 0 %
FEV1FVC-%Pred-Pre: 93 %
FEV6-%Change-Post: 9 %
FEV6-%Pred-Post: 74 %
FEV6-%Pred-Pre: 67 %
FEV6-Post: 3.19 L
FEV6-Pre: 2.91 L
FEV6FVC-%Change-Post: 0 %
FEV6FVC-%Pred-Post: 105 %
FEV6FVC-%Pred-Pre: 105 %
FVC-%Change-Post: 9 %
FVC-%Pred-Post: 70 %
FVC-%Pred-Pre: 63 %
FVC-Post: 3.21 L
Post FEV1/FVC ratio: 69 %
Post FEV6/FVC ratio: 100 %
Pre FEV1/FVC ratio: 68 %
Pre FEV6/FVC Ratio: 100 %
RV % pred: 106 %
RV: 2.69 L
TLC % pred: 89 %
TLC: 6.47 L

## 2020-08-26 ENCOUNTER — Other Ambulatory Visit: Payer: Self-pay

## 2020-08-26 ENCOUNTER — Ambulatory Visit
Admission: RE | Admit: 2020-08-26 | Discharge: 2020-08-26 | Disposition: A | Discharge status: Home / Self Care | Payer: Medicare HMO | Source: Ambulatory Visit | Attending: Primary Care | Admitting: Primary Care

## 2020-08-26 DIAGNOSIS — R911 Solitary pulmonary nodule: Secondary | ICD-10-CM | POA: Insufficient documentation

## 2020-08-26 LAB — GLUCOSE, CAPILLARY: Glucose-Capillary: 93 mg/dL (ref 70–99)

## 2020-08-26 MED ORDER — FLUDEOXYGLUCOSE F - 18 (FDG) INJECTION
10.4000 | Freq: Once | INTRAVENOUS | Status: AC | PRN
  Administered 2020-08-26: 10.97 via INTRAVENOUS

## 2020-08-30 ENCOUNTER — Telehealth: Payer: Self-pay | Admitting: Primary Care

## 2020-08-30 NOTE — Telephone Encounter (Signed)
Beth, please advise.   Patient can not travel to [place] due to transportation issues. You wanted patient to see BI for possible bronch.  Patient is scheduled to see MR on 09/10/2020.

## 2020-08-30 NOTE — Telephone Encounter (Signed)
I'll CC I[facility], may be able to do virtual visit

## 2020-08-30 NOTE — Telephone Encounter (Signed)
I have called and spoke with pt and he stated that his wife has her parent that has dementia and she is not able to leave them alone.  He is not able to come to the [place] office due to transportation.  We spoke about the procedure and that he would need someone to bring him and drive him home from the [place]. I advised him to talk to his wife and to call us back.  Will forward to BI as an [place].

## 2020-08-30 NOTE — Telephone Encounter (Signed)
I spoke with patient and went over PET results which showed hypermetabolic nodule at the RIGHT lung apex, most consistent primary bronchogenic carcinoma. We will be setting him up an apt with Dr. Valeta Harms to discuss next steps including potential  Bronchoscopy for tissue sampling.

## 2020-08-31 ENCOUNTER — Telehealth: Payer: Self-pay | Admitting: Internal Medicine

## 2020-08-31 NOTE — Telephone Encounter (Signed)
Called and spoke with pt and he is aware of appt scheduled with BI on 2/14 at 1:30.  Asked that he arrive around 1:15 to complete the paper work.  Pt voiced his understanding.

## 2020-09-06 ENCOUNTER — Encounter: Payer: Self-pay | Admitting: Pulmonary Disease

## 2020-09-06 ENCOUNTER — Ambulatory Visit: Payer: Medicare HMO | Admitting: Pulmonary Disease

## 2020-09-06 ENCOUNTER — Other Ambulatory Visit: Payer: Self-pay

## 2020-09-06 VITALS — BP 136/70 | HR 55 | Temp 97.4°F | Ht 73.0 in | Wt 208.0 lb

## 2020-09-06 DIAGNOSIS — Z8709 Personal history of other diseases of the respiratory system: Secondary | ICD-10-CM

## 2020-09-06 DIAGNOSIS — R942 Abnormal results of pulmonary function studies: Secondary | ICD-10-CM | POA: Diagnosis not present

## 2020-09-06 DIAGNOSIS — J449 Chronic obstructive pulmonary disease, unspecified: Secondary | ICD-10-CM

## 2020-09-06 DIAGNOSIS — Z87891 Personal history of nicotine dependence: Secondary | ICD-10-CM

## 2020-09-06 DIAGNOSIS — R911 Solitary pulmonary nodule: Secondary | ICD-10-CM | POA: Diagnosis not present

## 2020-09-06 MED ORDER — TRELEGY ELLIPTA 100-62.5-25 MCG/INH IN AEPB: 1.0000 | INHALATION_SPRAY | Freq: Every day | RESPIRATORY_TRACT | 6 refills | Status: DC

## 2020-09-06 MED ORDER — TRELEGY ELLIPTA 100-62.5-25 MCG/INH IN AEPB: 1.0000 | INHALATION_SPRAY | Freq: Every day | RESPIRATORY_TRACT | 0 refills | Status: DC

## 2020-09-06 MED ORDER — ALBUTEROL SULFATE HFA 108 (90 BASE) MCG/ACT IN AERS: 2.0000 | INHALATION_SPRAY | Freq: Four times a day (QID) | RESPIRATORY_TRACT | 6 refills | Status: DC | PRN

## 2020-09-06 NOTE — Progress Notes (Signed)
Synopsis: Referred in February 2022 for abnormal pet imaging by [name]  Subjective:   PATIENT ID: Luis Haney GENDER: male DOB: [birth year], MRN: [id number]  Chief Complaint  Patient presents with  . Consult    Lung nodule    70 year old gentleman, history of COPD, former smoker quit prior to hospitalization.  Patient was admitted to the hospital on 07/09/2020 with a loculated pleural effusion.  He had a pigtail catheter placed with lytics placed.  Cardiothoracic surgery also saw and evaluated the patient.  He had a right-sided empyema.  He completed a course of antimicrobials.  He had a right upper lobe spiculated lung nodule that was found on imaging at that time.  Subsequent hospital follow-up imaging patient was seen by one of our nurse practitioners from [facility], NP.  A nuclear medicine PET scan was ordered which revealed a hypermetabolic right upper lobe lung nodule with an SUV of 6 concerning for a primary bronchogenic carcinoma.  Patient was referred to me for evaluation and discussion for next steps of abnormal pet imaging.   Past Medical History:  Diagnosis Date  . COPD (chronic obstructive pulmonary disease) ([place])   . Pleural effusion 06/2020     Family History  Problem Relation Age of Onset  . Heart disease Mother   . Other Father        "old age"     Past Surgical History:  Procedure Laterality Date  . BACK SURGERY  2000  . CHEST TUBE INSERTION Right 06/2020  . NECK SURGERY  2012    Social History   Socioeconomic History  . Marital status: Married    Spouse name: Not on file  . Number of children: Not on file  . Years of education: Not on file  . Highest education level: Not on file  Occupational History  . Not on file  Tobacco Use  . Smoking status: Former Smoker    Packs/day: 2.00    Years: 30.00    Pack years: 60.00    Types: Cigarettes    Quit date: 06/2020    Years since quitting: 0.2  . Smokeless tobacco: Never Used   Vaping Use  . Vaping Use: Never used  Substance and Sexual Activity  . Alcohol use: Yes    Comment: beer occasionally  . Drug use: Not Currently  . Sexual activity: Not on file  Other Topics Concern  . Not on file  Social History Narrative  . Not on file   Social Determinants of Health   Financial Resource Strain: Not on file  Food Insecurity: Not on file  Transportation Needs: Not on file  Physical Activity: Not on file  Stress: Not on file  Social Connections: Not on file  Intimate Partner Violence: Not on file     No Known Allergies   Outpatient Medications Prior to Visit  Medication Sig Dispense Refill  . calcium carbonate (OS-CAL - DOSED IN MG OF ELEMENTAL CALCIUM) 1250 (500 Ca) MG tablet Take 1 tablet by mouth.    . Multiple Vitamin (MULTI VITAMIN MENS PO) Take by mouth.    . Potassium 99 MG TABS Take 99 mg by mouth daily.     Facility-Administered Medications Prior to Visit  Medication Dose Route Frequency Provider Last Rate Last Admin  . albuterol (PROVENTIL) (2.5 MG/3ML) 0.083% nebulizer solution 2.5 mg  2.5 mg Nebulization Once Martyn Ehrich, NP        Review of Systems  Constitutional: Negative for chills,  fever, malaise/fatigue and weight loss.  HENT: Negative for hearing loss, sore throat and tinnitus.   Eyes: Negative for blurred vision and double vision.  Respiratory: Negative for cough, hemoptysis, sputum production, shortness of breath, wheezing and stridor.   Cardiovascular: Negative for chest pain, palpitations, orthopnea, leg swelling and PND.  Gastrointestinal: Negative for abdominal pain, constipation, diarrhea, heartburn, nausea and vomiting.  Genitourinary: Negative for dysuria, hematuria and urgency.  Musculoskeletal: Negative for joint pain and myalgias.  Skin: Negative for itching and rash.  Neurological: Negative for dizziness, tingling, weakness and headaches.  Endo/Heme/Allergies: Negative for environmental allergies. Does not  bruise/bleed easily.  Psychiatric/Behavioral: Negative for depression. The patient is not nervous/anxious and does not have insomnia.   All other systems reviewed and are negative.    Objective:  Physical Exam Vitals reviewed.  Constitutional:      General: He is not in acute distress.    Appearance: He is well-developed and well-nourished.  HENT:     Head: Normocephalic and atraumatic.     Mouth/Throat:     Mouth: Oropharynx is clear and moist.  Eyes:     General: No scleral icterus.    Conjunctiva/sclera: Conjunctivae normal.     Pupils: Pupils are equal, round, and reactive to light.  Neck:     Vascular: No JVD.     Trachea: No tracheal deviation.  Cardiovascular:     Rate and Rhythm: Normal rate and regular rhythm.     Pulses: Intact distal pulses.     Heart sounds: Normal heart sounds. No murmur heard.   Pulmonary:     Effort: Pulmonary effort is normal. No tachypnea, accessory muscle usage or respiratory distress.     Breath sounds: No stridor. No wheezing, rhonchi or rales.  Abdominal:     General: Bowel sounds are normal. There is no distension.     Palpations: Abdomen is soft.     Tenderness: There is no abdominal tenderness.  Musculoskeletal:        General: No tenderness or edema.     Cervical back: Neck supple.  Lymphadenopathy:     Cervical: No cervical adenopathy.  Skin:    General: Skin is warm and dry.     Capillary Refill: Capillary refill takes less than 2 seconds.     Findings: No rash.  Neurological:     Mental Status: He is alert and oriented to person, place, and time.  Psychiatric:        Mood and Affect: Mood and affect normal.        Behavior: Behavior normal.      Vitals:   09/06/20 1333  BP: 136/70  Pulse: (!) 55  Temp: (!) 97.4 F (36.3 C)  TempSrc: Tympanic  SpO2: 97%  Weight: 208 lb (94.3 kg)  Height: 6\' 1"  (1.854 m)   97% on RA BMI Readings from Last 3 Encounters:  09/06/20 27.44 kg/m  08/26/20 26.39 kg/m  07/28/20  25.25 kg/m   Wt Readings from Last 3 Encounters:  09/06/20 208 lb (94.3 kg)  08/26/20 200 lb (90.7 kg)  07/28/20 191 lb 6.4 oz (86.8 kg)     CBC    Component Value Date/Time   WBC 7.4 07/15/2020 0333   RBC 2.82 (L) 07/15/2020 0333   HGB 8.6 (L) 07/15/2020 0333   HCT 26.6 (L) 07/15/2020 0333   PLT 399 07/15/2020 0333   MCV 94.3 07/15/2020 0333   MCH 30.5 07/15/2020 0333   MCHC 32.3 07/15/2020 0333   RDW  13.8 07/15/2020 0333   LYMPHSABS 1.6 07/10/2020 0338   MONOABS 0.9 07/10/2020 0338   EOSABS 0.1 07/10/2020 0338   BASOSABS 0.1 07/10/2020 0338    Chest Imaging: Nuclear medicine PET scan 08/26/2020: Right upper lobe PET avid lung nodule concerning for primary bronchogenic carcinoma.  No evidence of metastatic disease or adenopathy. The patient's images have been independently reviewed by me.    Pulmonary Functions Testing Results: PFT Results Latest Ref Rng & Units 08/23/2020  FVC-Predicted Pre % 63  FVC-Post L 3.21  FVC-Predicted Post % 70  Pre FEV1/FVC % % 68  Post FEV1/FCV % % 69  FEV1-Pre L 1.99  FEV1-Predicted Pre % 59  FEV1-Post L 2.20  DLCO uncorrected ml/min/mmHg 15.21  DLCO UNC% % 57  DLVA Predicted % 77  TLC L 6.47  TLC % Predicted % 89  RV % Predicted % 106    FeNO:   Pathology:   Echocardiogram:   Heart Catheterization:     Assessment & Plan:     ICD-10-CM   1. Lung nodule  R91.1 Ambulatory referral to Cardiothoracic Surgery  2. Abnormal PET of right lung  R94.2   3. History of pleural effusion  Z87.09   4. Former smoker  Z87.891   5. 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  Patient is a former smoker quit at the beginning of December at the time of his hospital admission.  He has recovered from this.  Time spent reviewing recent hospitalization and discharge summary Dr.  Wynelle Cleveland.  Plan: Recommend referral for direct resection of abnormal lesion due to high probability of malignancy. Referral placed to cardiothoracic surgery. Started new inhaler regimen Trelegy 100 As needed albuterol for shortness of breath and wheezing. See me in 3 months for follow-up.   Current Outpatient Medications:  .  calcium carbonate (OS-CAL - DOSED IN MG OF ELEMENTAL CALCIUM) 1250 (500 Ca) MG tablet, Take 1 tablet by mouth., Disp: , Rfl:  .  Multiple Vitamin (MULTI VITAMIN MENS PO), Take by mouth., Disp: , Rfl:  .  Potassium 99 MG TABS, Take 99 mg by mouth daily., Disp: , Rfl:  No current facility-administered medications for this visit.  Facility-Administered Medications Ordered in Other Visits:  .  albuterol (PROVENTIL) (2.5 MG/3ML) 0.083% nebulizer solution 2.5 mg, 2.5 mg, Nebulization, Once, Martyn Ehrich, NP   I spent 41 minutes dedicated to the care of this patient on the date of this encounter to include pre-visit review of records, face-to-face time with the patient discussing conditions above, post visit ordering of testing, clinical documentation with the electronic health record, making appropriate referrals as documented, and communicating necessary findings to members of the patients care team.   Garner Nash, DO [facility] 09/06/2020 2:13 PM

## 2020-09-06 NOTE — Patient Instructions (Signed)
Thank you for visiting Dr. Valeta Harms at [facility]. Today we recommend the following:  Orders Placed This Encounter  Procedures  . Ambulatory referral to Cardiothoracic Surgery   Samples of Trelegy 100 plus prescription  Return in about 3 months (around 12/04/2020) for with APP or Dr. Valeta Harms.    Please do your part to reduce the spread of COVID-19.

## 2020-09-10 ENCOUNTER — Ambulatory Visit: Payer: Medicare HMO | Admitting: Internal Medicine

## 2020-09-13 ENCOUNTER — Institutional Professional Consult (permissible substitution): Payer: Self-pay | Admitting: Pulmonary Disease

## 2020-09-17 ENCOUNTER — Other Ambulatory Visit: Payer: Self-pay

## 2020-09-17 ENCOUNTER — Institutional Professional Consult (permissible substitution): Payer: Medicare HMO | Admitting: Thoracic Surgery (Cardiothoracic Vascular Surgery)

## 2020-09-17 ENCOUNTER — Encounter: Payer: Self-pay | Admitting: Internal Medicine

## 2020-09-17 ENCOUNTER — Encounter: Payer: Self-pay | Admitting: Thoracic Surgery (Cardiothoracic Vascular Surgery)

## 2020-09-17 VITALS — BP 150/77 | HR 66 | Resp 20 | Ht 73.0 in | Wt 208.0 lb

## 2020-09-17 DIAGNOSIS — R911 Solitary pulmonary nodule: Secondary | ICD-10-CM | POA: Diagnosis not present

## 2020-09-17 LAB — PULMONARY FUNCTION TEST

## 2020-09-17 NOTE — Progress Notes (Signed)
[address]       [place] [address]             [phone]                    Luis Haney  Medical Record #[id number] Date of Birth: 1951/06/19  Referring: Garner Nash, DO Primary Care: [facility] Cardiologist: No primary care provider on file.  Chief Complaint:    Chief Complaint  Patient presents with  . Lung Lesion    Surgical consult, PET Scan  08/26/20, PFT 08/23/20, Chest CT 07/04/20    History of Present Illness:    Luis Haney 70 y.o. male referred by Dr. Valeta Harms for surgical evaluation of a right upper lobe PET avid pulmonary nodule. The patient initially presented to [facility] with a right-sided pneumonia which required transfer to [facility]. He subsequently underwent chest tube placement with thrombolytic therapy, and on cross-sectional imaging this right upper lobe nodule was noted. This all occurred in December and since that point he is recovered well from his pneumonia. His most recent PET scan showed resolution of the pneumonia but increased avidity in the upper lobe nodule.  From a respiratory standpoint, his functional status is improved. He is ready to return to work but wants to get this addressed first. He is remained tobacco free since being discharged from the hospital. He denies any neurologic symptoms. He is able to walk up 2 flights of stairs without any difficulty.   Zubrod Score: At the time of surgery this patient's most appropriate activity status/level should be described as: []     0    Normal activity, no symptoms []     1    Restricted in physical strenuous activity but ambulatory, able to do out light work []     2    Ambulatory and capable of self care, unable to do work activities, up and about               >50 % of waking hours                              []     3    Only limited self care, in bed greater than 50% of waking hours []     4    Completely disabled, no  self care, confined to bed or chair []     5    Moribund   Past Medical History:  Diagnosis Date  . COPD (chronic obstructive pulmonary disease) ([place])   . Pleural effusion 06/2020    Past Surgical History:  Procedure Laterality Date  . BACK SURGERY  2000  . CHEST TUBE INSERTION Right 06/2020  . NECK SURGERY  2012    Family History  Problem Relation Age of Onset  . Heart disease Mother   . Other Father        "old age"     Social History   Tobacco Use  Smoking Status Former Smoker  . Packs/day: 2.00  . Years: 30.00  . Pack years: 60.00  . Types: Cigarettes  . Quit date: 06/2020  . Years since quitting: 0.2  Smokeless Tobacco Never Used    Social History   Substance and Sexual Activity  Alcohol Use Yes   Comment: beer occasionally     No Known Allergies  Current Outpatient Medications  Medication Sig  Dispense Refill  . albuterol (VENTOLIN HFA) 108 (90 Base) MCG/ACT inhaler Inhale 2 puffs into the lungs every 6 (six) hours as needed for wheezing or shortness of breath. 8 g 6  . calcium carbonate (OS-CAL - DOSED IN MG OF ELEMENTAL CALCIUM) 1250 (500 Ca) MG tablet Take 1 tablet by mouth.    . Fluticasone-Umeclidin-Vilant (TRELEGY ELLIPTA) 100-62.5-25 MCG/INH AEPB Inhale 1 puff into the lungs daily. 60 each 6  . Multiple Vitamin (MULTI VITAMIN MENS PO) Take by mouth.    . Potassium 99 MG TABS Take 99 mg by mouth daily.     No current facility-administered medications for this visit.   Facility-Administered Medications Ordered in Other Visits  Medication Dose Route Frequency Provider Last Rate Last Admin  . albuterol (PROVENTIL) (2.5 MG/3ML) 0.083% nebulizer solution 2.5 mg  2.5 mg Nebulization Once Martyn Ehrich, NP        Review of Systems  Constitutional: Negative for malaise/fatigue and weight loss.  Respiratory: Positive for shortness of breath. Negative for cough.   Cardiovascular: Positive for chest pain.  Neurological: Negative.    Psychiatric/Behavioral: Negative.      PHYSICAL EXAMINATION: BP (!) 150/77   Pulse 66   Resp 20   Ht 6\' 1"  (1.854 m)   Wt 208 lb (94.3 kg)   SpO2 97% Comment: RA  BMI 27.44 kg/m  Physical Exam Constitutional:      General: He is not in acute distress.    Appearance: Normal appearance. He is normal weight. He is not ill-appearing or toxic-appearing.  HENT:     Head: Normocephalic and atraumatic.  Eyes:     Extraocular Movements: Extraocular movements intact.  Cardiovascular:     Rate and Rhythm: Normal rate.  Pulmonary:     Effort: Pulmonary effort is normal. No respiratory distress.  Abdominal:     General: There is no distension.     Palpations: Abdomen is soft.  Musculoskeletal:     Cervical back: Normal range of motion.  Skin:    General: Skin is dry.  Neurological:     General: No focal deficit present.     Mental Status: He is alert and oriented to person, place, and time.     Diagnostic Studies & Laboratory data:     Recent Radiology Findings:   NM PET Image Initial (PI) Skull Base To Thigh  Result Date: 08/26/2020 CLINICAL DATA:  Initial treatment strategy for pulmonary nodule. Recent empyema EXAM: NUCLEAR MEDICINE PET SKULL BASE TO THIGH TECHNIQUE: 10.9 mCi F-18 FDG was injected intravenously. Full-ring PET imaging was performed from the skull base to thigh after the radiotracer. CT data was obtained and used for attenuation correction and anatomic localization. Fasting blood glucose: 93 mg/dl COMPARISON:  CT 07/13/2020 FINDINGS: Mediastinal blood pool activity: SUV max 1.9 Liver activity: SUV max NA NECK: No hypermetabolic lymph nodes in the neck. Incidental CT findings: none CHEST: Spiculated nodule in the RIGHT upper lobe measures 12 mm (image 82) SUV max equal 6.1. No additional hypermetabolic pulmonary nodules. There is pleural thickening in the RIGHT hemithorax related to recent empyema. There is a focus of metabolic activity along the anterior margin of the  diaphragm adjacent to the liver with SUV max equal 5.4 on image 35. This is soft tissue thickening is favored residual infection/inflammation from recent empyema No hypermetabolic mediastinal lymph nodes. Incidental CT findings: none ABDOMEN/PELVIS: No abnormal hypermetabolic activity within the liver, pancreas, adrenal glands, or spleen. No hypermetabolic lymph nodes in the abdomen or  pelvis. Incidental CT findings: Atherosclerotic calcification of the aorta. SKELETON: No focal hypermetabolic activity to suggest skeletal metastasis. Incidental CT findings: none IMPRESSION: 1. Hypermetabolic nodule at the RIGHT lung apex is most consistent primary bronchogenic carcinoma. 2. No evidence of mediastinal metastatic adenopathy or distant metastatic disease. FDG PET staging 1A. 3. Hypermetabolic thickening in the anteromedial margin of the LEFT diaphragm is favored benign residual infection/inflammation from recent empyema. Electronically Signed   By: Suzy Bouchard M.D.   On: 08/26/2020 15:13       I have independently reviewed the above radiology studies  and reviewed the findings with the patient.   Recent Lab Findings: Lab Results  Component Value Date   WBC 7.4 07/15/2020   HGB 8.6 (L) 07/15/2020   HCT 26.6 (L) 07/15/2020   PLT 399 07/15/2020   GLUCOSE 99 07/15/2020   ALT 23 07/10/2020   AST 25 07/10/2020   NA 137 07/15/2020   K 3.7 07/15/2020   CL 102 07/15/2020   CREATININE 1.00 07/15/2020   BUN 8 07/15/2020   CO2 27 07/15/2020   TSH 2.960 06/28/2020   INR 1.4 (H) 06/28/2020     PFTs: - FVC: 59% - FEV1: 67% -DLCO: 57%  Problem List: 1.2 cm right upper lobe pulmonary nodule with SUV max of 6.1 Recent history of parapneumonic effusion treated with thrombolytics Marginal pulmonary function   Assessment / Plan:   70 year old male with a 1.2 cm right upper lobe pulmonary nodule is PET avid. Given his smoking history this is concerning for primary lung cancer. There does not  appear to be any mediastinal or hilar lymphadenopathy that is avid on PET/CT this would be very early stage. His pulmonary function testing is marginal but this was performed 1 month after being treated for severe pneumonia complicated by parapneumonic effusion. From a functional standpoint I think that he would be able to tolerate, lobectomy. Given the fact that he had a pretty large parapneumonic effusion there is strong likelihood that that he will multiple adhesions at the time of surgery. He would like some time to think about this, but will let us know within the week. Of spoke with Dr. Valeta Harms and he would be an ideal candidate for a combo procedure.     I  spent 40 minutes with  the patient face to face in counseling and coordination of care.    [name] 09/17/2020 4:09 PM

## 2020-09-17 NOTE — H&P (View-Only) (Signed)
[address]       [place], [address]             [phone]                    Luis Haney  Medical Record #[id number] Date of Birth: [birth year]  Referring: Garner Nash, DO Primary Care: [facility] [place] [facility] Primary Cardiologist: No primary care provider on file.  Chief Complaint:    Chief Complaint  Patient presents with  . Lung Lesion    Surgical consult, PET Scan  08/26/20, PFT 08/23/20, Chest CT 07/04/20    History of Present Illness:    Luis Haney 70 y.o. male referred by Dr. Valeta Harms for surgical evaluation of a right upper lobe PET avid pulmonary nodule. The patient initially presented to [facility] with a right-sided pneumonia which required transfer to [facility]. He subsequently underwent chest tube placement with thrombolytic therapy, and on cross-sectional imaging this right upper lobe nodule was noted. This all occurred in December and since that point he is recovered well from his pneumonia. His most recent PET scan showed resolution of the pneumonia but increased avidity in the upper lobe nodule.  From a respiratory standpoint, his functional status is improved. He is ready to return to work but wants to get this addressed first. He is remained tobacco free since being discharged from the hospital. He denies any neurologic symptoms. He is able to walk up 2 flights of stairs without any difficulty.   Zubrod Score: At the time of surgery this patient's most appropriate activity status/level should be described as: []     0    Normal activity, no symptoms []     1    Restricted in physical strenuous activity but ambulatory, able to do out light work []     2    Ambulatory and capable of self care, unable to do work activities, up and about               >50 % of waking hours                              []     3    Only limited self care, in bed greater than 50% of waking hours []     4    Completely disabled, no  self care, confined to bed or chair []     5    Moribund   Past Medical History:  Diagnosis Date  . COPD (chronic obstructive pulmonary disease) ([place])   . Pleural effusion 06/2020    Past Surgical History:  Procedure Laterality Date  . BACK SURGERY  2000  . CHEST TUBE INSERTION Right 06/2020  . NECK SURGERY  2012    Family History  Problem Relation Age of Onset  . Heart disease Mother   . Other Father        "old age"     Social History   Tobacco Use  Smoking Status Former Smoker  . Packs/day: 2.00  . Years: 30.00  . Pack years: 60.00  . Types: Cigarettes  . Quit date: 06/2020  . Years since quitting: 0.2  Smokeless Tobacco Never Used    Social History   Substance and Sexual Activity  Alcohol Use Yes   Comment: beer occasionally     No Known Allergies  Current Outpatient Medications  Medication Sig  Dispense Refill  . albuterol (VENTOLIN HFA) 108 (90 Base) MCG/ACT inhaler Inhale 2 puffs into the lungs every 6 (six) hours as needed for wheezing or shortness of breath. 8 g 6  . calcium carbonate (OS-CAL - DOSED IN MG OF ELEMENTAL CALCIUM) 1250 (500 Ca) MG tablet Take 1 tablet by mouth.    . Fluticasone-Umeclidin-Vilant (TRELEGY ELLIPTA) 100-62.5-25 MCG/INH AEPB Inhale 1 puff into the lungs daily. 60 each 6  . Multiple Vitamin (MULTI VITAMIN MENS PO) Take by mouth.    . Potassium 99 MG TABS Take 99 mg by mouth daily.     No current facility-administered medications for this visit.   Facility-Administered Medications Ordered in Other Visits  Medication Dose Route Frequency Provider Last Rate Last Admin  . albuterol (PROVENTIL) (2.5 MG/3ML) 0.083% nebulizer solution 2.5 mg  2.5 mg Nebulization Once Martyn Ehrich, NP        Review of Systems  Constitutional: Negative for malaise/fatigue and weight loss.  Respiratory: Positive for shortness of breath. Negative for cough.   Cardiovascular: Positive for chest pain.  Neurological: Negative.    Psychiatric/Behavioral: Negative.      PHYSICAL EXAMINATION: BP (!) 150/77   Pulse 66   Resp 20   Ht 6\' 1"  (1.854 m)   Wt 208 lb (94.3 kg)   SpO2 97% Comment: RA  BMI 27.44 kg/m  Physical Exam Constitutional:      General: He is not in acute distress.    Appearance: Normal appearance. He is normal weight. He is not ill-appearing or toxic-appearing.  HENT:     Head: Normocephalic and atraumatic.  Eyes:     Extraocular Movements: Extraocular movements intact.  Cardiovascular:     Rate and Rhythm: Normal rate.  Pulmonary:     Effort: Pulmonary effort is normal. No respiratory distress.  Abdominal:     General: There is no distension.     Palpations: Abdomen is soft.  Musculoskeletal:     Cervical back: Normal range of motion.  Skin:    General: Skin is dry.  Neurological:     General: No focal deficit present.     Mental Status: He is alert and oriented to person, place, and time.     Diagnostic Studies & Laboratory data:     Recent Radiology Findings:   NM PET Image Initial (PI) Skull Base To Thigh  Result Date: 08/26/2020 CLINICAL DATA:  Initial treatment strategy for pulmonary nodule. Recent empyema EXAM: NUCLEAR MEDICINE PET SKULL BASE TO THIGH TECHNIQUE: 10.9 mCi F-18 FDG was injected intravenously. Full-ring PET imaging was performed from the skull base to thigh after the radiotracer. CT data was obtained and used for attenuation correction and anatomic localization. Fasting blood glucose: 93 mg/dl COMPARISON:  CT 07/13/2020 FINDINGS: Mediastinal blood pool activity: SUV max 1.9 Liver activity: SUV max NA NECK: No hypermetabolic lymph nodes in the neck. Incidental CT findings: none CHEST: Spiculated nodule in the RIGHT upper lobe measures 12 mm (image 82) SUV max equal 6.1. No additional hypermetabolic pulmonary nodules. There is pleural thickening in the RIGHT hemithorax related to recent empyema. There is a focus of metabolic activity along the anterior margin of the  diaphragm adjacent to the liver with SUV max equal 5.4 on image 35. This is soft tissue thickening is favored residual infection/inflammation from recent empyema No hypermetabolic mediastinal lymph nodes. Incidental CT findings: none ABDOMEN/PELVIS: No abnormal hypermetabolic activity within the liver, pancreas, adrenal glands, or spleen. No hypermetabolic lymph nodes in the abdomen or  pelvis. Incidental CT findings: Atherosclerotic calcification of the aorta. SKELETON: No focal hypermetabolic activity to suggest skeletal metastasis. Incidental CT findings: none IMPRESSION: 1. Hypermetabolic nodule at the RIGHT lung apex is most consistent primary bronchogenic carcinoma. 2. No evidence of mediastinal metastatic adenopathy or distant metastatic disease. FDG PET staging 1A. 3. Hypermetabolic thickening in the anteromedial margin of the LEFT diaphragm is favored benign residual infection/inflammation from recent empyema. Electronically Signed   By: Suzy Bouchard M.D.   On: 08/26/2020 15:13       I have independently reviewed the above radiology studies  and reviewed the findings with the patient.   Recent Lab Findings: Lab Results  Component Value Date   WBC 7.4 07/15/2020   HGB 8.6 (L) 07/15/2020   HCT 26.6 (L) 07/15/2020   PLT 399 07/15/2020   GLUCOSE 99 07/15/2020   ALT 23 07/10/2020   AST 25 07/10/2020   NA 137 07/15/2020   K 3.7 07/15/2020   CL 102 07/15/2020   CREATININE 1.00 07/15/2020   BUN 8 07/15/2020   CO2 27 07/15/2020   TSH 2.960 06/28/2020   INR 1.4 (H) 06/28/2020     PFTs: - FVC: 59% - FEV1: 67% -DLCO: 57%  Problem List: 1.2 cm right upper lobe pulmonary nodule with SUV max of 6.1 Recent history of parapneumonic effusion treated with thrombolytics Marginal pulmonary function   Assessment / Plan:   70 year old male with a 1.2 cm right upper lobe pulmonary nodule is PET avid. Given his smoking history this is concerning for primary lung cancer. There does not  appear to be any mediastinal or hilar lymphadenopathy that is avid on PET/CT this would be very early stage. His pulmonary function testing is marginal but this was performed 1 month after being treated for severe pneumonia complicated by parapneumonic effusion. From a functional standpoint I think that he would be able to tolerate, lobectomy. Given the fact that he had a pretty large parapneumonic effusion there is strong likelihood that that he will multiple adhesions at the time of surgery. He would like some time to think about this, but will let us know within the week. Of spoke with Dr. Valeta Harms and he would be an ideal candidate for a combo procedure.     I  spent 40 minutes with  the patient face to face in counseling and coordination of care.    [name] 09/17/2020 4:09 PM

## 2020-09-21 ENCOUNTER — Other Ambulatory Visit: Payer: Self-pay | Admitting: *Deleted

## 2020-09-21 DIAGNOSIS — R911 Solitary pulmonary nodule: Secondary | ICD-10-CM

## 2020-09-21 DIAGNOSIS — C3490 Malignant neoplasm of unspecified part of unspecified bronchus or lung: Secondary | ICD-10-CM

## 2020-09-21 HISTORY — DX: Malignant neoplasm of unspecified part of unspecified bronchus or lung: C34.90

## 2020-09-21 HISTORY — DX: Solitary pulmonary nodule: R91.1

## 2020-09-22 ENCOUNTER — Telehealth: Payer: Self-pay | Admitting: Pulmonary Disease

## 2020-09-22 DIAGNOSIS — R911 Solitary pulmonary nodule: Secondary | ICD-10-CM

## 2020-09-22 NOTE — Telephone Encounter (Signed)
PCCM:  Planned combination case ENB + RATS 10/07/2020  Orders placed  Super D Order placed   I need to start my clinic at 9:30AM that morning. Please push my 9AM consult to the 930 slot.   Thanks   Garner Nash, DO [facility] 09/22/2020 10:26 AM

## 2020-09-23 NOTE — Telephone Encounter (Addendum)
CT has been scheduled for 3/10 @ 3:30 COVID was scheduled by someone else for 3/15 @ 9:06 @ [facility] ENB is set for 3/17 @ 7:30.    I have left the patient 2 messages to provide appt info.   UPDATE:  Pt returned my call & is aware of all upcoming appointments.

## 2020-09-24 ENCOUNTER — Inpatient Hospital Stay (HOSPITAL_COMMUNITY): Admission: RE | Admit: 2020-09-24 | Payer: Medicare HMO | Source: Ambulatory Visit

## 2020-09-30 ENCOUNTER — Other Ambulatory Visit: Payer: Self-pay

## 2020-09-30 ENCOUNTER — Ambulatory Visit (HOSPITAL_COMMUNITY)
Admission: RE | Admit: 2020-09-30 | Discharge: 2020-09-30 | Disposition: A | Discharge status: Home / Self Care | Payer: Medicare HMO | Source: Ambulatory Visit | Attending: Pulmonary Disease | Admitting: Pulmonary Disease

## 2020-09-30 DIAGNOSIS — R911 Solitary pulmonary nodule: Secondary | ICD-10-CM | POA: Diagnosis not present

## 2020-09-30 DIAGNOSIS — Z8701 Personal history of pneumonia (recurrent): Secondary | ICD-10-CM | POA: Diagnosis not present

## 2020-09-30 DIAGNOSIS — J90 Pleural effusion, not elsewhere classified: Secondary | ICD-10-CM | POA: Diagnosis not present

## 2020-09-30 DIAGNOSIS — J929 Pleural plaque without asbestos: Secondary | ICD-10-CM | POA: Diagnosis not present

## 2020-09-30 DIAGNOSIS — J984 Other disorders of lung: Secondary | ICD-10-CM | POA: Diagnosis not present

## 2020-10-04 NOTE — Progress Notes (Signed)
Your procedure is scheduled on Thursday, October 07, 2020.  Report to [facility] Main Entrance "A" at 05:30 A.M., and check in at the Admitting office.  Call this number if you have problems the morning of surgery: [phone]  Call [phone] if you have any questions prior to your surgery date Monday-Friday 8am-4pm   Remember: Do not eat or drink after midnight the night before your surgery  Take these medicines the morning of surgery with A SIP OF WATER: none    albuterol (VENTOLIN HFA) ----- Please bring all inhalers with you the day of surgery.  Fluticasone-Umeclidin-Vilant (TRELEGY ELLIPTA)   As of today, STOP taking any Aspirin (unless otherwise instructed by your surgeon), Aleve, Naproxen, Ibuprofen, Motrin, Advil, Goody's, BC's, all herbal medications, fish oil, and all vitamins.    The Morning of Surgery  Do not wear jewelry  Do not wear lotions, powders, colognes, or deodorant.   Men may shave face and neck.  Do not bring valuables to the hospital.  [facility] is not responsible for any belongings or valuables.  If you are a smoker, DO NOT Smoke 24 hours prior to surgery  If you wear a CPAP at night please bring your mask the morning of surgery   Remember that you must have someone to transport you home after your surgery, and remain with you for 24 hours if you are discharged the same day.   Please bring cases for contacts, glasses, hearing aids, dentures or bridgework because it cannot be worn into surgery.    Leave your suitcase in the car.  After surgery it may be brought to your room.  For patients admitted to the hospital, discharge time will be determined by your treatment team.  Patients discharged the day of surgery will not be allowed to drive home.    Special instructions:   [facility]- Preparing For Surgery  Before surgery, you can play an important role. Because skin is not sterile, your skin needs to be as free of germs as possible. You  can reduce the number of germs on your skin by washing with CHG (chlorahexidine gluconate) Soap before surgery.  CHG is an antiseptic cleaner which kills germs and bonds with the skin to continue killing germs even after washing.    Oral Hygiene is also important to reduce your risk of infection.  Remember - BRUSH YOUR TEETH THE MORNING OF SURGERY WITH YOUR REGULAR TOOTHPASTE  Please do not use if you have an allergy to CHG or antibacterial soaps. If your skin becomes reddened/irritated stop using the CHG.  Do not shave (including legs and underarms) for at least 48 hours prior to first CHG shower. It is OK to shave your face.  Please follow these instructions carefully.   1. Shower the NIGHT BEFORE SURGERY and the MORNING OF SURGERY with CHG Soap.   2. If you chose to wash your hair and body, wash as usual with your normal shampoo and body-wash/soap.  3. Rinse your hair and body thoroughly to remove the shampoo and soap.  4. Apply CHG directly to the skin (ONLY FROM THE NECK DOWN) and wash gently with a scrungie or a clean washcloth.   5. Do not use on open wounds or open sores. Avoid contact with your eyes, ears, mouth and genitals (private parts). Wash Face and genitals (private parts)  with your normal soap.   6. Wash thoroughly, paying special attention to the area where your surgery will be performed.  7.  Thoroughly rinse your body with warm water from the neck down.  8. DO NOT shower/wash with your normal soap after using and rinsing off the CHG Soap.  9. Pat yourself dry with a CLEAN TOWEL.  10. Wear CLEAN PAJAMAS to bed the night before surgery  11. Place CLEAN SHEETS on your bed the night of your first shower and DO NOT SLEEP WITH PETS.  12. Wear comfortable clothes the morning of surgery.     Day of Surgery:  Please shower the morning of surgery with the CHG soap Do not apply any deodorants/lotions. Please wear clean clothes to the hospital/surgery center.    Remember to brush your teeth WITH YOUR REGULAR TOOTHPASTE.   Please read over the following fact sheets that you were given.

## 2020-10-05 ENCOUNTER — Encounter (HOSPITAL_COMMUNITY)
Admission: RE | Admit: 2020-10-05 | Discharge: 2020-10-05 | Disposition: A | Discharge status: Home / Self Care | Payer: Medicare HMO | Source: Ambulatory Visit | Attending: Pulmonary Disease | Admitting: Pulmonary Disease

## 2020-10-05 ENCOUNTER — Other Ambulatory Visit: Payer: Self-pay

## 2020-10-05 ENCOUNTER — Other Ambulatory Visit
Admission: RE | Admit: 2020-10-05 | Discharge: 2020-10-05 | Disposition: A | Discharge status: Home / Self Care | Payer: Medicare HMO | Source: Ambulatory Visit | Attending: Thoracic Surgery (Cardiothoracic Vascular Surgery) | Admitting: Thoracic Surgery (Cardiothoracic Vascular Surgery)

## 2020-10-05 ENCOUNTER — Encounter (HOSPITAL_COMMUNITY)
Admission: RE | Admit: 2020-10-05 | Discharge: 2020-10-05 | Disposition: A | Discharge status: Home / Self Care | Payer: Medicare HMO | Source: Ambulatory Visit | Attending: Thoracic Surgery (Cardiothoracic Vascular Surgery) | Admitting: Thoracic Surgery (Cardiothoracic Vascular Surgery)

## 2020-10-05 ENCOUNTER — Encounter (HOSPITAL_COMMUNITY): Payer: Self-pay

## 2020-10-05 DIAGNOSIS — R911 Solitary pulmonary nodule: Secondary | ICD-10-CM

## 2020-10-05 DIAGNOSIS — Z01812 Encounter for preprocedural laboratory examination: Secondary | ICD-10-CM | POA: Diagnosis not present

## 2020-10-05 DIAGNOSIS — Z5309 Procedure and treatment not carried out because of other contraindication: Secondary | ICD-10-CM | POA: Diagnosis not present

## 2020-10-05 DIAGNOSIS — J449 Chronic obstructive pulmonary disease, unspecified: Secondary | ICD-10-CM | POA: Diagnosis not present

## 2020-10-05 DIAGNOSIS — Z01818 Encounter for other preprocedural examination: Secondary | ICD-10-CM

## 2020-10-05 DIAGNOSIS — C3411 Malignant neoplasm of upper lobe, right bronchus or lung: Secondary | ICD-10-CM | POA: Diagnosis not present

## 2020-10-05 DIAGNOSIS — Z7951 Long term (current) use of inhaled steroids: Secondary | ICD-10-CM | POA: Diagnosis not present

## 2020-10-05 DIAGNOSIS — Z20822 Contact with and (suspected) exposure to covid-19: Secondary | ICD-10-CM | POA: Diagnosis not present

## 2020-10-05 DIAGNOSIS — Z8249 Family history of ischemic heart disease and other diseases of the circulatory system: Secondary | ICD-10-CM | POA: Diagnosis not present

## 2020-10-05 DIAGNOSIS — I10 Essential (primary) hypertension: Secondary | ICD-10-CM | POA: Diagnosis not present

## 2020-10-05 DIAGNOSIS — Z79899 Other long term (current) drug therapy: Secondary | ICD-10-CM | POA: Diagnosis not present

## 2020-10-05 DIAGNOSIS — J941 Fibrothorax: Secondary | ICD-10-CM | POA: Diagnosis not present

## 2020-10-05 DIAGNOSIS — Z87891 Personal history of nicotine dependence: Secondary | ICD-10-CM | POA: Diagnosis not present

## 2020-10-05 DIAGNOSIS — D62 Acute posthemorrhagic anemia: Secondary | ICD-10-CM | POA: Diagnosis not present

## 2020-10-05 HISTORY — DX: Pneumonia, unspecified organism: J18.9

## 2020-10-05 LAB — BLOOD GAS, ARTERIAL
Acid-base deficit: 0.1 mmol/L (ref 0.0–2.0)
Bicarbonate: 24.2 mmol/L (ref 20.0–28.0)
FIO2: 21
O2 Saturation: 96.5 %
Patient temperature: 37
pCO2 arterial: 40.4 mmHg (ref 32.0–48.0)
pH, Arterial: 7.395 (ref 7.350–7.450)
pO2, Arterial: 83.2 mmHg (ref 83.0–108.0)

## 2020-10-05 LAB — COMPREHENSIVE METABOLIC PANEL
ALT: 15 U/L (ref 0–44)
AST: 23 U/L (ref 15–41)
Albumin: 4.1 g/dL (ref 3.5–5.0)
Alkaline Phosphatase: 50 U/L (ref 38–126)
Anion gap: 9 (ref 5–15)
BUN: 23 mg/dL (ref 8–23)
CO2: 21 mmol/L — ABNORMAL LOW (ref 22–32)
Calcium: 9.3 mg/dL (ref 8.9–10.3)
Chloride: 107 mmol/L (ref 98–111)
Creatinine, Ser: 1.16 mg/dL (ref 0.61–1.24)
GFR, Estimated: >60 mL/min (ref >60)
Glucose, Bld: 107 mg/dL — ABNORMAL HIGH (ref 70–99)
Potassium: 4.4 mmol/L (ref 3.5–5.1)
Sodium: 137 mmol/L (ref 135–145)
Total Bilirubin: 1 mg/dL (ref 0.3–1.2)
Total Protein: 7.1 g/dL (ref 6.5–8.1)

## 2020-10-05 LAB — CBC
HCT: 41.1 % (ref 39.0–52.0)
Hemoglobin: 12.8 g/dL — ABNORMAL LOW (ref 13.0–17.0)
MCH: 31.1 pg (ref 26.0–34.0)
MCHC: 31.1 g/dL (ref 30.0–36.0)
MCV: 100 fL (ref 80.0–100.0)
Platelets: 211 10*3/uL (ref 150–400)
RBC: 4.11 MIL/uL — ABNORMAL LOW (ref 4.22–5.81)
RDW: 13.7 % (ref 11.5–15.5)
WBC: 7.1 10*3/uL (ref 4.0–10.5)
nRBC: 0 % (ref 0.0–0.2)

## 2020-10-05 LAB — URINALYSIS, ROUTINE W REFLEX MICROSCOPIC
Bilirubin Urine: NEGATIVE
Glucose, UA: NEGATIVE mg/dL
Hgb urine dipstick: NEGATIVE
Ketones, ur: NEGATIVE mg/dL
Leukocytes,Ua: NEGATIVE
Nitrite: NEGATIVE
Protein, ur: NEGATIVE mg/dL
Specific Gravity, Urine: 1.013 (ref 1.005–1.030)
pH: 7 (ref 5.0–8.0)

## 2020-10-05 LAB — PROTIME-INR
INR: 1 (ref 0.8–1.2)
Prothrombin Time: 12.8 seconds (ref 11.4–15.2)

## 2020-10-05 LAB — SARS CORONAVIRUS 2 (TAT 6-24 HRS): SARS Coronavirus 2: NEGATIVE

## 2020-10-05 LAB — APTT: aPTT: 28 seconds (ref 24–36)

## 2020-10-05 LAB — SURGICAL PCR SCREEN
MRSA, PCR: NEGATIVE
Staphylococcus aureus: NEGATIVE

## 2020-10-05 NOTE — Progress Notes (Signed)
PCP - Primary Care in [place]:  Valera Castle, MD, MBA Cardiologist - denies  PPM/ICD - denies   Chest x-ray - 10/05/20 EKG - 10/05/20 Stress Test - denies ECHO - denies Cardiac Cath - denies  Sleep Study - denies  Patient instructed to hold all Aspirin, NSAID's, herbal medications, fish oil and vitamins 7 days prior to surgery.   ERAS Protcol -no PRE-SURGERY Ensure or G2-   COVID TEST- 10/05/20   Anesthesia review: yes, COPD  Patient denies shortness of breath, fever, cough and chest pain at PAT appointment   All instructions explained to the patient, with a verbal understanding of the material. Patient agrees to go over the instructions while at home for a better understanding. Patient also instructed to self quarantine after being tested for COVID-19. The opportunity to ask questions was provided.

## 2020-10-07 ENCOUNTER — Encounter (HOSPITAL_COMMUNITY)
Admission: RE | Disposition: A | Discharge status: Home / Self Care | Payer: Self-pay | Source: Ambulatory Visit | Attending: Thoracic Surgery (Cardiothoracic Vascular Surgery)

## 2020-10-07 ENCOUNTER — Encounter (HOSPITAL_COMMUNITY): Payer: Self-pay | Admitting: Pulmonary Disease

## 2020-10-07 ENCOUNTER — Inpatient Hospital Stay (HOSPITAL_COMMUNITY): Payer: Medicare HMO

## 2020-10-07 ENCOUNTER — Ambulatory Visit (HOSPITAL_COMMUNITY): Payer: Medicare HMO | Admitting: Physician Assistant

## 2020-10-07 ENCOUNTER — Ambulatory Visit (HOSPITAL_COMMUNITY): Payer: Medicare HMO

## 2020-10-07 ENCOUNTER — Ambulatory Visit (HOSPITAL_COMMUNITY): Payer: Medicare HMO | Admitting: Certified Registered Nurse Anesthetist

## 2020-10-07 ENCOUNTER — Other Ambulatory Visit: Payer: Self-pay

## 2020-10-07 ENCOUNTER — Inpatient Hospital Stay (HOSPITAL_COMMUNITY)
Admission: RE | Admit: 2020-10-07 | Discharge: 2020-10-09 | DRG: 168 | Disposition: A | Discharge status: Home / Self Care | Payer: Medicare HMO | Source: Ambulatory Visit | Attending: Thoracic Surgery (Cardiothoracic Vascular Surgery) | Admitting: Thoracic Surgery (Cardiothoracic Vascular Surgery)

## 2020-10-07 DIAGNOSIS — Z7951 Long term (current) use of inhaled steroids: Secondary | ICD-10-CM | POA: Diagnosis not present

## 2020-10-07 DIAGNOSIS — Z5309 Procedure and treatment not carried out because of other contraindication: Secondary | ICD-10-CM | POA: Diagnosis not present

## 2020-10-07 DIAGNOSIS — Z79899 Other long term (current) drug therapy: Secondary | ICD-10-CM | POA: Diagnosis not present

## 2020-10-07 DIAGNOSIS — J449 Chronic obstructive pulmonary disease, unspecified: Secondary | ICD-10-CM | POA: Diagnosis not present

## 2020-10-07 DIAGNOSIS — D62 Acute posthemorrhagic anemia: Secondary | ICD-10-CM | POA: Diagnosis not present

## 2020-10-07 DIAGNOSIS — I10 Essential (primary) hypertension: Secondary | ICD-10-CM | POA: Diagnosis present

## 2020-10-07 DIAGNOSIS — C3411 Malignant neoplasm of upper lobe, right bronchus or lung: Secondary | ICD-10-CM | POA: Diagnosis not present

## 2020-10-07 DIAGNOSIS — J9601 Acute respiratory failure with hypoxia: Secondary | ICD-10-CM | POA: Diagnosis not present

## 2020-10-07 DIAGNOSIS — Z87891 Personal history of nicotine dependence: Secondary | ICD-10-CM | POA: Diagnosis not present

## 2020-10-07 DIAGNOSIS — J941 Fibrothorax: Secondary | ICD-10-CM | POA: Diagnosis present

## 2020-10-07 DIAGNOSIS — J9811 Atelectasis: Secondary | ICD-10-CM | POA: Diagnosis not present

## 2020-10-07 DIAGNOSIS — J90 Pleural effusion, not elsewhere classified: Secondary | ICD-10-CM | POA: Diagnosis not present

## 2020-10-07 DIAGNOSIS — R918 Other nonspecific abnormal finding of lung field: Secondary | ICD-10-CM | POA: Diagnosis not present

## 2020-10-07 DIAGNOSIS — R911 Solitary pulmonary nodule: Secondary | ICD-10-CM | POA: Diagnosis not present

## 2020-10-07 DIAGNOSIS — Z8249 Family history of ischemic heart disease and other diseases of the circulatory system: Secondary | ICD-10-CM

## 2020-10-07 DIAGNOSIS — E440 Moderate protein-calorie malnutrition: Secondary | ICD-10-CM | POA: Diagnosis not present

## 2020-10-07 DIAGNOSIS — Z09 Encounter for follow-up examination after completed treatment for conditions other than malignant neoplasm: Secondary | ICD-10-CM

## 2020-10-07 HISTORY — PX: BRONCHIAL NEEDLE ASPIRATION BIOPSY: SHX5106

## 2020-10-07 HISTORY — PX: BRONCHIAL BRUSHINGS: SHX5108

## 2020-10-07 HISTORY — PX: CHEST TUBE INSERTION: SHX231

## 2020-10-07 HISTORY — PX: SUBMUCOSAL INJECTION: SHX5543

## 2020-10-07 HISTORY — PX: VIDEO BRONCHOSCOPY WITH ENDOBRONCHIAL NAVIGATION: SHX6175

## 2020-10-07 HISTORY — DX: Dyspnea, unspecified: R06.00

## 2020-10-07 HISTORY — PX: BRONCHIAL BIOPSY: SHX5109

## 2020-10-07 LAB — ABO/RH: ABO/RH(D): A POS

## 2020-10-07 LAB — PREPARE RBC (CROSSMATCH)

## 2020-10-07 SURGERY — VIDEO BRONCHOSCOPY WITH ENDOBRONCHIAL NAVIGATION
Anesthesia: General

## 2020-10-07 SURGERY — LOBECTOMY, LUNG, ROBOT-ASSISTED, USING VATS
Anesthesia: General | Site: Chest | Laterality: Right

## 2020-10-07 MED ORDER — LIDOCAINE 2% (20 MG/ML) 5 ML SYRINGE
INTRAMUSCULAR | Status: AC
  Filled 2020-10-07: qty 5

## 2020-10-07 MED ORDER — POTASSIUM 99 MG PO TABS: 99.0000 mg | ORAL_TABLET | Freq: Every day | ORAL | Status: DC

## 2020-10-07 MED ORDER — BISACODYL 5 MG PO TBEC
10.0000 mg | DELAYED_RELEASE_TABLET | Freq: Every day | ORAL | Status: DC
  Administered 2020-10-08: 10 mg via ORAL
  Filled 2020-10-07 (×2): qty 2

## 2020-10-07 MED ORDER — FENTANYL CITRATE (PF) 100 MCG/2ML IJ SOLN
INTRAMUSCULAR | Status: DC | PRN
  Administered 2020-10-07 (×2): 50 ug via INTRAVENOUS
  Administered 2020-10-07: 100 ug via INTRAVENOUS
  Administered 2020-10-07 (×2): 50 ug via INTRAVENOUS

## 2020-10-07 MED ORDER — EPHEDRINE 5 MG/ML INJ
INTRAVENOUS | Status: AC
  Filled 2020-10-07: qty 10

## 2020-10-07 MED ORDER — SODIUM CHLORIDE (PF) 0.9 % IJ SOLN
INTRAMUSCULAR | Status: DC | PRN
  Administered 2020-10-07: 50 mL

## 2020-10-07 MED ORDER — ACETAMINOPHEN 160 MG/5ML PO SOLN: 1000.0000 mg | Freq: Four times a day (QID) | ORAL | Status: DC

## 2020-10-07 MED ORDER — FENTANYL CITRATE (PF) 250 MCG/5ML IJ SOLN
INTRAMUSCULAR | Status: AC
  Filled 2020-10-07: qty 5

## 2020-10-07 MED ORDER — FLUTICASONE-UMECLIDIN-VILANT 100-62.5-25 MCG/INH IN AEPB: 1.0000 | INHALATION_SPRAY | Freq: Every day | RESPIRATORY_TRACT | Status: DC

## 2020-10-07 MED ORDER — ONDANSETRON HCL 4 MG/2ML IJ SOLN: 4.0000 mg | Freq: Four times a day (QID) | INTRAMUSCULAR | Status: DC | PRN

## 2020-10-07 MED ORDER — ROCURONIUM BROMIDE 10 MG/ML (PF) SYRINGE
PREFILLED_SYRINGE | INTRAVENOUS | Status: AC
  Filled 2020-10-07: qty 10

## 2020-10-07 MED ORDER — PHENYLEPHRINE HCL-NACL 10-0.9 MG/250ML-% IV SOLN
INTRAVENOUS | Status: DC | PRN
  Administered 2020-10-07: 50 ug/min via INTRAVENOUS

## 2020-10-07 MED ORDER — ACETAMINOPHEN 325 MG PO TABS: 325.0000 mg | ORAL_TABLET | Freq: Once | ORAL | Status: DC | PRN

## 2020-10-07 MED ORDER — CEFAZOLIN SODIUM-DEXTROSE 2-4 GM/100ML-% IV SOLN
INTRAVENOUS | Status: AC
  Filled 2020-10-07: qty 100

## 2020-10-07 MED ORDER — PROPOFOL 10 MG/ML IV BOLUS
INTRAVENOUS | Status: AC
  Filled 2020-10-07: qty 20

## 2020-10-07 MED ORDER — DEXAMETHASONE SODIUM PHOSPHATE 10 MG/ML IJ SOLN
INTRAMUSCULAR | Status: DC | PRN
  Administered 2020-10-07: 10 mg via INTRAVENOUS

## 2020-10-07 MED ORDER — CALCIUM CARBONATE 1250 (500 CA) MG PO TABS
1.0000 | ORAL_TABLET | Freq: Three times a day (TID) | ORAL | Status: DC
  Administered 2020-10-07 – 2020-10-09 (×5): 500 mg via ORAL
  Filled 2020-10-07 (×7): qty 1

## 2020-10-07 MED ORDER — ALBUTEROL SULFATE (2.5 MG/3ML) 0.083% IN NEBU: 2.5000 mg | INHALATION_SOLUTION | Freq: Four times a day (QID) | RESPIRATORY_TRACT | Status: DC | PRN

## 2020-10-07 MED ORDER — BUPIVACAINE HCL (PF) 0.5 % IJ SOLN
INTRAMUSCULAR | Status: AC
  Filled 2020-10-07: qty 30

## 2020-10-07 MED ORDER — AMISULPRIDE (ANTIEMETIC) 5 MG/2ML IV SOLN: 10.0000 mg | Freq: Once | INTRAVENOUS | Status: DC | PRN

## 2020-10-07 MED ORDER — SUGAMMADEX SODIUM 200 MG/2ML IV SOLN
INTRAVENOUS | Status: DC | PRN
  Administered 2020-10-07: 500 mg via INTRAVENOUS

## 2020-10-07 MED ORDER — ALBUTEROL SULFATE HFA 108 (90 BASE) MCG/ACT IN AERS: 2.0000 | INHALATION_SPRAY | Freq: Four times a day (QID) | RESPIRATORY_TRACT | Status: DC | PRN

## 2020-10-07 MED ORDER — KETOROLAC TROMETHAMINE 15 MG/ML IJ SOLN
15.0000 mg | Freq: Four times a day (QID) | INTRAMUSCULAR | Status: DC | PRN
  Administered 2020-10-07: 15 mg via INTRAVENOUS
  Filled 2020-10-07: qty 1

## 2020-10-07 MED ORDER — LACTATED RINGERS IV SOLN: INTRAVENOUS | Status: DC

## 2020-10-07 MED ORDER — TRAMADOL HCL 50 MG PO TABS
50.0000 mg | ORAL_TABLET | Freq: Four times a day (QID) | ORAL | Status: DC | PRN
  Administered 2020-10-07: 50 mg via ORAL

## 2020-10-07 MED ORDER — PROPOFOL 10 MG/ML IV BOLUS
INTRAVENOUS | Status: DC | PRN
  Administered 2020-10-07: 100 mg via INTRAVENOUS
  Administered 2020-10-07: 30 mg via INTRAVENOUS

## 2020-10-07 MED ORDER — ENOXAPARIN SODIUM 40 MG/0.4ML ~~LOC~~ SOLN
40.0000 mg | SUBCUTANEOUS | Status: DC
  Administered 2020-10-07 – 2020-10-08 (×2): 40 mg via SUBCUTANEOUS
  Filled 2020-10-07 (×2): qty 0.4

## 2020-10-07 MED ORDER — UMECLIDINIUM BROMIDE 62.5 MCG/INH IN AEPB
1.0000 | INHALATION_SPRAY | Freq: Every day | RESPIRATORY_TRACT | Status: DC
  Administered 2020-10-08 – 2020-10-09 (×2): 1 via RESPIRATORY_TRACT
  Filled 2020-10-07: qty 7

## 2020-10-07 MED ORDER — ACETAMINOPHEN 10 MG/ML IV SOLN: 1000.0000 mg | Freq: Once | INTRAVENOUS | Status: DC | PRN

## 2020-10-07 MED ORDER — EPHEDRINE SULFATE-NACL 50-0.9 MG/10ML-% IV SOSY
PREFILLED_SYRINGE | INTRAVENOUS | Status: DC | PRN
  Administered 2020-10-07 (×2): 5 mg via INTRAVENOUS
  Administered 2020-10-07: 10 mg via INTRAVENOUS

## 2020-10-07 MED ORDER — TRAMADOL HCL 50 MG PO TABS: 50.0000 mg | ORAL_TABLET | Freq: Four times a day (QID) | ORAL | Status: DC | PRN

## 2020-10-07 MED ORDER — ACETAMINOPHEN 160 MG/5ML PO SOLN: 325.0000 mg | Freq: Once | ORAL | Status: DC | PRN

## 2020-10-07 MED ORDER — BUPIVACAINE LIPOSOME 1.3 % IJ SUSP
INTRAMUSCULAR | Status: DC | PRN
  Administered 2020-10-07: 65 mL

## 2020-10-07 MED ORDER — LACTATED RINGERS IV SOLN: INTRAVENOUS | Status: DC | PRN

## 2020-10-07 MED ORDER — FLUTICASONE FUROATE-VILANTEROL 100-25 MCG/INH IN AEPB
1.0000 | INHALATION_SPRAY | Freq: Every day | RESPIRATORY_TRACT | Status: DC
  Administered 2020-10-08 – 2020-10-09 (×2): 1 via RESPIRATORY_TRACT
  Filled 2020-10-07: qty 28

## 2020-10-07 MED ORDER — FENTANYL CITRATE (PF) 100 MCG/2ML IJ SOLN
INTRAMUSCULAR | Status: AC
  Filled 2020-10-07: qty 2

## 2020-10-07 MED ORDER — ACETAMINOPHEN 500 MG PO TABS
1000.0000 mg | ORAL_TABLET | Freq: Four times a day (QID) | ORAL | Status: DC | PRN
  Administered 2020-10-07: 1000 mg via ORAL

## 2020-10-07 MED ORDER — FENTANYL CITRATE (PF) 100 MCG/2ML IJ SOLN: 25.0000 ug | INTRAMUSCULAR | Status: DC | PRN

## 2020-10-07 MED ORDER — CEFAZOLIN SODIUM-DEXTROSE 2-4 GM/100ML-% IV SOLN: 2.0000 g | INTRAVENOUS | Status: DC

## 2020-10-07 MED ORDER — ORAL CARE MOUTH RINSE: 15.0000 mL | Freq: Once | OROMUCOSAL | Status: AC

## 2020-10-07 MED ORDER — FENTANYL CITRATE (PF) 100 MCG/2ML IJ SOLN
25.0000 ug | INTRAMUSCULAR | Status: DC | PRN
  Administered 2020-10-07: 25 ug via INTRAVENOUS

## 2020-10-07 MED ORDER — ROCURONIUM BROMIDE 10 MG/ML (PF) SYRINGE
PREFILLED_SYRINGE | INTRAVENOUS | Status: DC | PRN
  Administered 2020-10-07: 20 mg via INTRAVENOUS
  Administered 2020-10-07: 60 mg via INTRAVENOUS
  Administered 2020-10-07: 50 mg via INTRAVENOUS
  Administered 2020-10-07: 20 mg via INTRAVENOUS

## 2020-10-07 MED ORDER — INDOCYANINE GREEN 25 MG IV SOLR
INTRAVENOUS | Status: DC | PRN
  Administered 2020-10-07: 1.25 mg

## 2020-10-07 MED ORDER — TRAMADOL HCL 50 MG PO TABS
ORAL_TABLET | ORAL | Status: AC
  Filled 2020-10-07: qty 1

## 2020-10-07 MED ORDER — 0.9 % SODIUM CHLORIDE (POUR BTL) OPTIME
TOPICAL | Status: DC | PRN
  Administered 2020-10-07: 2000 mL

## 2020-10-07 MED ORDER — ONDANSETRON HCL 4 MG/2ML IJ SOLN
INTRAMUSCULAR | Status: AC
  Filled 2020-10-07: qty 2

## 2020-10-07 MED ORDER — CHLORHEXIDINE GLUCONATE 0.12 % MT SOLN: 15.0000 mL | Freq: Once | OROMUCOSAL | Status: AC

## 2020-10-07 MED ORDER — ACETAMINOPHEN 500 MG PO TABS
1000.0000 mg | ORAL_TABLET | Freq: Four times a day (QID) | ORAL | Status: DC
  Administered 2020-10-08 – 2020-10-09 (×6): 1000 mg via ORAL
  Filled 2020-10-07 (×7): qty 2

## 2020-10-07 MED ORDER — SENNOSIDES-DOCUSATE SODIUM 8.6-50 MG PO TABS
1.0000 | ORAL_TABLET | Freq: Every day | ORAL | Status: DC
  Filled 2020-10-07: qty 1

## 2020-10-07 MED ORDER — MEPERIDINE HCL 25 MG/ML IJ SOLN: 6.2500 mg | INTRAMUSCULAR | Status: DC | PRN

## 2020-10-07 MED ORDER — BUPIVACAINE LIPOSOME 1.3 % IJ SUSP
INTRAMUSCULAR | Status: AC
  Filled 2020-10-07: qty 20

## 2020-10-07 MED ORDER — DEXAMETHASONE SODIUM PHOSPHATE 10 MG/ML IJ SOLN
INTRAMUSCULAR | Status: AC
  Filled 2020-10-07: qty 1

## 2020-10-07 MED ORDER — PHENYLEPHRINE 40 MCG/ML (10ML) SYRINGE FOR IV PUSH (FOR BLOOD PRESSURE SUPPORT)
PREFILLED_SYRINGE | INTRAVENOUS | Status: AC
  Filled 2020-10-07: qty 10

## 2020-10-07 MED ORDER — METHYLENE BLUE 0.5 % INJ SOLN
INTRAVENOUS | Status: DC | PRN
  Administered 2020-10-07: 1.25 mL via SUBMUCOSAL

## 2020-10-07 MED ORDER — SODIUM CHLORIDE 0.9% IV SOLUTION: Freq: Once | INTRAVENOUS | Status: DC

## 2020-10-07 MED ORDER — CEFAZOLIN SODIUM-DEXTROSE 2-4 GM/100ML-% IV SOLN
2.0000 g | Freq: Three times a day (TID) | INTRAVENOUS | Status: AC
  Administered 2020-10-07 – 2020-10-08 (×2): 2 g via INTRAVENOUS
  Filled 2020-10-07 (×2): qty 100

## 2020-10-07 MED ORDER — ONDANSETRON HCL 4 MG/2ML IJ SOLN
INTRAMUSCULAR | Status: DC | PRN
  Administered 2020-10-07: 4 mg via INTRAVENOUS

## 2020-10-07 MED ORDER — CHLORHEXIDINE GLUCONATE 0.12 % MT SOLN
OROMUCOSAL | Status: AC
  Administered 2020-10-07: 15 mL via OROMUCOSAL
  Filled 2020-10-07: qty 15

## 2020-10-07 SURGICAL SUPPLY — 106 items
BLADE CLIPPER SURG (BLADE) ×2 IMPLANT
BLADE SURG 11 STRL SS (BLADE) ×2 IMPLANT
BNDG COHESIVE 6X5 TAN STRL LF (GAUZE/BANDAGES/DRESSINGS) ×2 IMPLANT
CANISTER SUCT 3000ML PPV (MISCELLANEOUS) ×4 IMPLANT
CANNULA REDUC XI 12-8 STAPL (CANNULA) ×2
CANNULA REDUCER 12-8 DVNC XI (CANNULA) ×2 IMPLANT
CATH THORACIC 28FR (CATHETERS) IMPLANT
CATH THORACIC 28FR RT ANG (CATHETERS) IMPLANT
CATH THORACIC 36FR (CATHETERS) IMPLANT
CATH THORACIC 36FR RT ANG (CATHETERS) IMPLANT
CATH TROCAR 20FR (CATHETERS) IMPLANT
CHLORAPREP W/TINT 26 (MISCELLANEOUS) ×2 IMPLANT
CLIP VESOCCLUDE MED 6/CT (CLIP) IMPLANT
CNTNR URN SCR LID CUP LEK RST (MISCELLANEOUS) ×5 IMPLANT
CONN ST 1/4X3/8  BEN (MISCELLANEOUS)
CONN ST 1/4X3/8 BEN (MISCELLANEOUS) IMPLANT
CONN Y 3/8X3/8X3/8  BEN (MISCELLANEOUS)
CONN Y 3/8X3/8X3/8 BEN (MISCELLANEOUS) IMPLANT
CONT SPEC 4OZ STRL OR WHT (MISCELLANEOUS) ×5
COVER SURGICAL LIGHT HANDLE (MISCELLANEOUS) IMPLANT
DEFOGGER SCOPE WARMER CLEARIFY (MISCELLANEOUS) ×2 IMPLANT
DERMABOND ADVANCED (GAUZE/BANDAGES/DRESSINGS) ×1
DERMABOND ADVANCED .7 DNX12 (GAUZE/BANDAGES/DRESSINGS) ×1 IMPLANT
DISSECTOR BLUNT TIP ENDO 5MM (MISCELLANEOUS) IMPLANT
DRAIN CHANNEL 28F RND 3/8 FF (WOUND CARE) IMPLANT
DRAIN CHANNEL 32F RND 10.7 FF (WOUND CARE) IMPLANT
DRAPE ARM DVNC X/XI (DISPOSABLE) ×4 IMPLANT
DRAPE COLUMN DVNC XI (DISPOSABLE) ×1 IMPLANT
DRAPE CV SPLIT W-CLR ANES SCRN (DRAPES) ×2 IMPLANT
DRAPE DA VINCI XI ARM (DISPOSABLE) ×4
DRAPE DA VINCI XI COLUMN (DISPOSABLE) ×1
DRAPE ORTHO SPLIT 77X108 STRL (DRAPES) ×1
DRAPE SURG ORHT 6 SPLT 77X108 (DRAPES) ×1 IMPLANT
ELECT BLADE 6.5 EXT (BLADE) IMPLANT
ELECT REM PT RETURN 9FT ADLT (ELECTROSURGICAL) ×2
ELECTRODE REM PT RTRN 9FT ADLT (ELECTROSURGICAL) ×1 IMPLANT
GAUZE KITTNER 4X10 (MISCELLANEOUS) ×2 IMPLANT
GAUZE KITTNER 4X8 (MISCELLANEOUS) IMPLANT
GAUZE SPONGE 4X4 12PLY STRL (GAUZE/BANDAGES/DRESSINGS) ×2 IMPLANT
GAUZE SPONGE 4X4 12PLY STRL LF (GAUZE/BANDAGES/DRESSINGS) ×2 IMPLANT
GLOVE BIO SURGEON STRL SZ7.5 (GLOVE) ×4 IMPLANT
GOWN STRL REUS W/ TWL LRG LVL3 (GOWN DISPOSABLE) ×2 IMPLANT
GOWN STRL REUS W/ TWL XL LVL3 (GOWN DISPOSABLE) ×3 IMPLANT
GOWN STRL REUS W/TWL 2XL LVL3 (GOWN DISPOSABLE) ×2 IMPLANT
GOWN STRL REUS W/TWL LRG LVL3 (GOWN DISPOSABLE) ×2
GOWN STRL REUS W/TWL XL LVL3 (GOWN DISPOSABLE) ×3
HEMOSTAT SURGICEL 2X14 (HEMOSTASIS) ×6 IMPLANT
IRRIGATION STRYKERFLOW (MISCELLANEOUS) IMPLANT
IRRIGATOR STRYKERFLOW (MISCELLANEOUS)
KIT BASIN OR (CUSTOM PROCEDURE TRAY) ×2 IMPLANT
KIT SUCTION CATH 14FR (SUCTIONS) IMPLANT
KIT TURNOVER KIT B (KITS) ×2 IMPLANT
LOOP VESSEL SUPERMAXI WHITE (MISCELLANEOUS) IMPLANT
NEEDLE 22X1 1/2 (OR ONLY) (NEEDLE) ×2 IMPLANT
NS IRRIG 1000ML POUR BTL (IV SOLUTION) ×6 IMPLANT
PACK CHEST (CUSTOM PROCEDURE TRAY) ×2 IMPLANT
PAD ARMBOARD 7.5X6 YLW CONV (MISCELLANEOUS) ×10 IMPLANT
PORT ACCESS TROCAR AIRSEAL 12 (TROCAR) ×1 IMPLANT
PORT ACCESS TROCAR AIRSEAL 5M (TROCAR) ×1
POUCH ENDO CATCH II 15MM (MISCELLANEOUS) IMPLANT
POUCH SPECIMEN RETRIEVAL 10MM (ENDOMECHANICALS) IMPLANT
RETRACTOR WOUND ALXS 19CM XSML (INSTRUMENTS) IMPLANT
RTRCTR WOUND ALEXIS 19CM XSML (INSTRUMENTS)
SCISSORS LAP 5X35 DISP (ENDOMECHANICALS) IMPLANT
SEAL CANN UNIV 5-8 DVNC XI (MISCELLANEOUS) ×2 IMPLANT
SEAL XI 5MM-8MM UNIVERSAL (MISCELLANEOUS) ×2
SEALANT PROGEL (MISCELLANEOUS) IMPLANT
SEALANT SURG COSEAL 4ML (VASCULAR PRODUCTS) IMPLANT
SEALANT SURG COSEAL 8ML (VASCULAR PRODUCTS) IMPLANT
SEALER LIGASURE MARYLAND 30 (ELECTROSURGICAL) IMPLANT
SET TRI-LUMEN FLTR TB AIRSEAL (TUBING) ×2 IMPLANT
SOLUTION ELECTROLUBE (MISCELLANEOUS) IMPLANT
SPONGE INTESTINAL PEANUT (DISPOSABLE) IMPLANT
SPONGE TONSIL TAPE 1 RFD (DISPOSABLE) IMPLANT
STAPLER CANNULA SEAL DVNC XI (STAPLE) ×2 IMPLANT
STAPLER CANNULA SEAL XI (STAPLE) ×2
STOPCOCK 4 WAY LG BORE MALE ST (IV SETS) ×2 IMPLANT
SUT MNCRL AB 3-0 PS2 18 (SUTURE) IMPLANT
SUT MON AB 2-0 CT1 36 (SUTURE) IMPLANT
SUT PDS AB 1 CTX 36 (SUTURE) IMPLANT
SUT PROLENE 4 0 RB 1 (SUTURE)
SUT PROLENE 4-0 RB1 .5 CRCL 36 (SUTURE) IMPLANT
SUT SILK  1 MH (SUTURE) ×1
SUT SILK 1 MH (SUTURE) ×1 IMPLANT
SUT SILK 1 TIES 10X30 (SUTURE) IMPLANT
SUT SILK 2 0 SH (SUTURE) IMPLANT
SUT SILK 2 0SH CR/8 30 (SUTURE) IMPLANT
SUT VIC AB 1 CTX 36 (SUTURE)
SUT VIC AB 1 CTX36XBRD ANBCTR (SUTURE) IMPLANT
SUT VIC AB 2-0 CT1 27 (SUTURE) ×1
SUT VIC AB 2-0 CT1 TAPERPNT 27 (SUTURE) ×1 IMPLANT
SUT VIC AB 3-0 SH 27 (SUTURE) ×2
SUT VIC AB 3-0 SH 27X BRD (SUTURE) ×2 IMPLANT
SUT VICRYL 0 TIES 12 18 (SUTURE) ×2 IMPLANT
SUT VICRYL 0 UR6 27IN ABS (SUTURE) ×4 IMPLANT
SUT VICRYL 2 TP 1 (SUTURE) IMPLANT
SYR 10ML LL (SYRINGE) ×2 IMPLANT
SYR 20ML LL LF (SYRINGE) ×2 IMPLANT
SYR 50ML LL SCALE MARK (SYRINGE) ×2 IMPLANT
SYSTEM SAHARA CHEST DRAIN ATS (WOUND CARE) ×2 IMPLANT
TAPE CLOTH 4X10 WHT NS (GAUZE/BANDAGES/DRESSINGS) ×2 IMPLANT
TAPE PAPER 3X10 WHT MICROPORE (GAUZE/BANDAGES/DRESSINGS) ×2 IMPLANT
TIP APPLICATOR SPRAY EXTEND 16 (VASCULAR PRODUCTS) IMPLANT
TOWEL GREEN STERILE (TOWEL DISPOSABLE) ×2 IMPLANT
TRAY FOLEY MTR SLVR 16FR STAT (SET/KITS/TRAYS/PACK) ×2 IMPLANT
TUBING EXTENTION W/L.L. (IV SETS) ×2 IMPLANT

## 2020-10-07 SURGICAL SUPPLY — 44 items
ADAPTER BRONCH F/PENTAX (ADAPTER) ×3 IMPLANT
ADAPTER VALVE BIOPSY EBUS (MISCELLANEOUS) IMPLANT
ADPTR VALVE BIOPSY EBUS (MISCELLANEOUS)
BRUSH CYTOL CELLEBRITY 1.5X140 (MISCELLANEOUS) ×3 IMPLANT
BRUSH SUPERTRAX BIOPSY (INSTRUMENTS) IMPLANT
BRUSH SUPERTRAX NDL-TIP CYTO (INSTRUMENTS) ×3 IMPLANT
CANISTER SUCT 3000ML PPV (MISCELLANEOUS) ×3 IMPLANT
CHANNEL WORK EXTEND EDGE 180 (KITS) IMPLANT
CHANNEL WORK EXTEND EDGE 45 (KITS) IMPLANT
CHANNEL WORK EXTEND EDGE 90 (KITS) IMPLANT
CONT SPEC 4OZ CLIKSEAL STRL BL (MISCELLANEOUS) ×3 IMPLANT
COVER BACK TABLE 60X90IN (DRAPES) ×3 IMPLANT
FILTER STRAW FLUID ASPIR (MISCELLANEOUS) IMPLANT
FORCEPS BIOP SUPERTRX PREMAR (INSTRUMENTS) ×3 IMPLANT
GAUZE SPONGE 4X4 12PLY STRL (GAUZE/BANDAGES/DRESSINGS) ×3 IMPLANT
GLOVE SURG SS PI 7.5 STRL IVOR (GLOVE) ×6 IMPLANT
GOWN STRL REUS W/ TWL LRG LVL3 (GOWN DISPOSABLE) ×4 IMPLANT
GOWN STRL REUS W/TWL LRG LVL3 (GOWN DISPOSABLE) ×2
KIT CLEAN ENDO COMPLIANCE (KITS) ×3 IMPLANT
KIT LOCATABLE GUIDE (CANNULA) IMPLANT
KIT MARKER FIDUCIAL DELIVERY (KITS) IMPLANT
KIT PROCEDURE EDGE 180 (KITS) IMPLANT
KIT PROCEDURE EDGE 45 (KITS) IMPLANT
KIT PROCEDURE EDGE 90 (KITS) IMPLANT
KIT TURNOVER KIT B (KITS) ×3 IMPLANT
MARKER SKIN DUAL TIP RULER LAB (MISCELLANEOUS) ×3 IMPLANT
NEEDLE SUPERTRX PREMARK BIOPSY (NEEDLE) ×3 IMPLANT
NS IRRIG 1000ML POUR BTL (IV SOLUTION) ×3 IMPLANT
OIL SILICONE PENTAX (PARTS (SERVICE/REPAIRS)) ×3 IMPLANT
PAD ARMBOARD 7.5X6 YLW CONV (MISCELLANEOUS) ×6 IMPLANT
PATCHES PATIENT (LABEL) ×9 IMPLANT
SOL ANTI FOG 6CC (MISCELLANEOUS) ×2 IMPLANT
SOLUTION ANTI FOG 6CC (MISCELLANEOUS) ×1
SYR 20CC LL (SYRINGE) ×3 IMPLANT
SYR 20ML ECCENTRIC (SYRINGE) ×3 IMPLANT
SYR 50ML SLIP (SYRINGE) ×3 IMPLANT
TOWEL OR 17X24 6PK STRL BLUE (TOWEL DISPOSABLE) ×3 IMPLANT
TRAP SPECIMEN MUCOUS 40CC (MISCELLANEOUS) IMPLANT
TUBE CONNECTING 20X1/4 (TUBING) ×3 IMPLANT
UNDERPAD 30X30 (UNDERPADS AND DIAPERS) ×3 IMPLANT
VALVE BIOPSY  SINGLE USE (MISCELLANEOUS) ×1
VALVE BIOPSY SINGLE USE (MISCELLANEOUS) ×2 IMPLANT
VALVE SUCTION BRONCHIO DISP (MISCELLANEOUS) ×3 IMPLANT
WATER STERILE IRR 1000ML POUR (IV SOLUTION) ×3 IMPLANT

## 2020-10-07 NOTE — Anesthesia Preprocedure Evaluation (Addendum)
Anesthesia Evaluation  Patient identified by MRN, date of birth, ID band Patient awake    Reviewed: Allergy & Precautions, NPO status , Patient's Chart, lab work & pertinent test results  Airway Mallampati: III  TM Distance: >3 FB Neck ROM: Limited    Dental  (+) Teeth Intact, Dental Advisory Given   Pulmonary COPD, former smoker,    breath sounds clear to auscultation       Cardiovascular negative cardio ROS   Rhythm:Regular Rate:Normal     Neuro/Psych negative neurological ROS  negative psych ROS   GI/Hepatic negative GI ROS, Neg liver ROS,   Endo/Other  negative endocrine ROS  Renal/GU negative Renal ROS     Musculoskeletal negative musculoskeletal ROS (+)   Abdominal Normal abdominal exam  (+)   Peds  Hematology negative hematology ROS (+)   Anesthesia Other Findings   Reproductive/Obstetrics                            Anesthesia Physical Anesthesia Plan  ASA: III  Anesthesia Plan: General   Post-op Pain Management:    Induction: Intravenous  PONV Risk Score and Plan: 3 and Ondansetron, Dexamethasone and Midazolam  Airway Management Planned: Double Lumen EBT and Oral ETT  Additional Equipment: Arterial line  Intra-op Plan:   Post-operative Plan: Extubation in OR  Informed Consent: I have reviewed the patients History and Physical, chart, labs and discussed the procedure including the risks, benefits and alternatives for the proposed anesthesia with the patient or authorized representative who has indicated his/her understanding and acceptance.     Dental advisory given  Plan Discussed with: CRNA  Anesthesia Plan Comments: (- 2 large bore IV's)       Anesthesia Quick Evaluation

## 2020-10-07 NOTE — Discharge Instructions (Signed)
Video-Assisted Thoracic Surgery, Care After The following information offers guidance on how to care for yourself after your procedure. Your health care provider may also give you more specific instructions. If you have problems or questions, contact your health care provider. What can I expect after the procedure? After the procedure, it is common to have:  Some pain and soreness in your chest.  Pain when breathing in (inhaling) or coughing.  Constipation.  Tiredness.  Trouble sleeping. Follow these instructions at home: Preventing pneumonia  Do deep breathing exercises and cough regularly as directed. This helps clear mucus and opens your lungs. Doing this helps prevent lung infection (pneumonia).  If you were given an incentive spirometer, use it as told. An incentive spirometer is a tool that measures how well you are filling your lungs with each breath.  Coughing may hurt less if you try to support your chest. This is called splinting. Try one of these when you cough: ? Hold a pillow against your chest. ? Place the palms of both hands on top of your incision area.  Do not use any products that contain nicotine or tobacco. These products include cigarettes, chewing tobacco, and vaping devices, such as e-cigarettes. If you need help quitting, ask your health care provider.  Avoid secondhand smoke.   Medicines  Take over-the-counter and prescription medicines only as told by your health care provider.  If you have pain, take pain medicine before your pain becomes severe. This is important. If your pain is under control, you will be able to breathe and cough more comfortably.  If you were prescribed an antibiotic medicine, take it as told by your health care provider. Do not stop taking the antibiotic even if you start to feel better.  Ask your health care provider if the medicine prescribed to you: ? Requires you to avoid driving or using machinery. ? Can cause constipation.  You may need to take these actions to prevent or treat constipation:  Drink enough fluid to keep your urine pale yellow.  Take over-the-counter or prescription medicines.  Eat foods that are high in fiber, such as beans, whole grains, and fresh fruits and vegetables.  Limit foods that are high in fat and processed sugars, such as fried or sweet foods. Bathing  Do not take baths, swim, or use a hot tub until your health care provider approves.  Ask your health care provider if you may take showers. You may only be allowed to take sponge baths. Incision care  Follow instructions from your health care provider about how to take care of your incision area. Make sure you: ? Wash your hands with soap and water for at least 20 seconds before and after you change your bandage (dressing). If soap and water are not available, use hand sanitizer. ? Change your dressing as told by your health care provider. ? Leave stitches (sutures), skin glue, staples, or adhesive strips in place. These skin closures may need to stay in place for 2 weeks or longer. If adhesive strip edges start to loosen and curl up, you may trim the loose edges. Do not remove adhesive strips completely unless your health care provider tells you to do that.  Keep your dressing dry until it has been removed.  Check your incision area every day for signs of infection. Check for: ? Redness, swelling, or more pain. ? Fluid or blood. ? Warmth. ? Pus or a bad smell.   Activity  Avoid activities that use your chest  muscles for at least 3-4 weeks.  Do not lift anything that is heavier than 10 lb (4.5 kg), or the limit that you are told, until your health care provider says that it is safe.  Return to your normal activities as told by your health care provider. Ask your health care provider what activities are safe for you.  Rest as told by your health care provider.  Avoid sitting for a long time without moving. Get up to take  short walks every 1-2 hours. This is important to improve blood flow and breathing. Ask for help if you feel weak or unsteady.  Do exercises as told by your health care provider. General instructions  If you were given a sedative during the procedure, it can affect you for several hours. Do not drive or operate machinery until your health care provider says that it is safe.  If you have a chest tube, care for it as told. Do not travel by airplane during the 2 weeks after your chest tube is removed, or until your health care provider says that this is safe.  Keep all follow-up visits. This is important. Contact a health care provider if:  You have any of these signs of infection: ? Redness, swelling, or more pain around any incision. ? Fluid or blood coming from any incision. ? Warmth coming from any incision. ? Pus or a bad smell coming from any incision. ? A fever or chills.  You have nausea or vomiting.  You have pain that does not get better with medicine. Get help right away if:  You have chest pain.  You have fast or irregular heartbeats.  You develop a rash.  You have shortness of breath or trouble breathing.  You are confused.  You have trouble speaking.  You feel weak, light-headed, or dizzy, or you faint. These symptoms may represent a serious problem that is an emergency. Do not wait to see if the symptoms will go away. Get medical help right away. Call your local emergency services (911 in the [place] Do not drive yourself to the hospital. Summary  Take deep breaths and cough often. This helps clear mucus and opens your lungs. Doing this helps prevent lung infection (pneumonia).  If you have pain, take pain medicine before your pain becomes severe. This is important. If your pain is under control, you will be able to breathe and cough more comfortably.  Check your incision area every day for signs of infection. Contact your health care provider if you have any  signs of infection.  Ask your health care provider what activities are safe for you. This information is not intended to replace advice given to you by your health care provider. Make sure you discuss any questions you have with your health care provider. Document Revised: 04/02/2020 Document Reviewed: 04/02/2020 Elsevier Patient Education  2021 [employer].

## 2020-10-07 NOTE — Brief Op Note (Signed)
10/07/2020  10:31 AM  PATIENT:  Joaquin Courts  70 y.o. male  PRE-OPERATIVE DIAGNOSIS:  RUL PULMONARY NODULE  POST-OPERATIVE DIAGNOSIS:  RUL PULMONARY NODULE  PROCEDURE:  Procedure(s):  ABORTED XI ROBOTIC ASSISTED THORASCOPY-RIGHT UPPER LOBECTOMY (Right)  SURGEON:  Surgeon(s) and Role:    * Lightfoot, Lucile Crater, MD - Primary  PHYSICIAN ASSISTANT: Ellwood Handler PA-C  ANESTHESIA:   general  EBL: None  BLOOD ADMINISTERED:none  DRAINS: 28 Blake Drain, Right Chest   LOCAL MEDICATIONS USED:  NONE  SPECIMEN:  No Specimen  DISPOSITION OF SPECIMEN:  N/A  COUNTS:  YES  TOURNIQUET:  * No tourniquets in log *  DICTATION: .Dragon Dictation  PLAN OF CARE: Admit to inpatient   PATIENT DISPOSITION:  PACU - hemodynamically stable.   Delay start of Pharmacological VTE agent (>24hrs) due to surgical blood loss or risk of bleeding: no

## 2020-10-07 NOTE — Interval H&P Note (Signed)
History and Physical Interval Note:  10/07/2020 7:39 AM  Luis Haney  has presented today for surgery, with the diagnosis of RUL PULMONARY NODULE.  The various methods of treatment have been discussed with the patient and family. After consideration of risks, benefits and other options for treatment, the patient has consented to  Procedure(s): XI ROBOTIC ASSISTED THORASCOPY-RIGHT UPPER LOBECTOMY (Right) as a surgical intervention.  The patient's history has been reviewed, patient examined, no change in status, stable for surgery.  I have reviewed the patient's chart and labs.  Questions were answered to the patient's satisfaction.     Jobina Maita Bary Leriche

## 2020-10-07 NOTE — Anesthesia Procedure Notes (Signed)
Arterial Line Insertion Start/End3/17/2022 7:18 AM, 10/07/2020 7:20 AM Performed by: [name], MD, anesthesiologist  Patient location: Pre-op. Preanesthetic checklist: patient identified, IV checked, site marked, risks and benefits discussed, surgical consent, monitors and equipment checked, pre-op evaluation, timeout performed and anesthesia consent Lidocaine 1% used for infiltration Right, radial was placed Catheter size: 20 Fr Hand hygiene performed  and maximum sterile barriers used   Attempts: 1 Procedure performed without using ultrasound guided technique. Following insertion, dressing applied and Biopatch. Post procedure assessment: normal and unchanged  Patient tolerated the procedure well with no immediate complications. Additional procedure comments: Previous attempts on L x2 (CRNA).

## 2020-10-07 NOTE — Anesthesia Procedure Notes (Signed)
Procedure Name: Intubation Date/Time: 10/07/2020 7:59 AM Performed by: Georgia Duff, CRNA Pre-anesthesia Checklist: Patient identified, Emergency Drugs available, Suction available and Patient being monitored Patient Re-evaluated:Patient Re-evaluated prior to induction Oxygen Delivery Method: Circle System Utilized Preoxygenation: Pre-oxygenation with 100% oxygen Induction Type: IV induction Ventilation: Mask ventilation without difficulty Laryngoscope Size: Miller and 2 Grade View: Grade I Tube type: Oral Tube size: 8.5 mm Number of attempts: 1 Airway Equipment and Method: Stylet and Oral airway Placement Confirmation: ETT inserted through vocal cords under direct vision,  positive ETCO2 and breath sounds checked- equal and bilateral Secured at: 23 cm Tube secured with: Tape Dental Injury: Teeth and Oropharynx as per pre-operative assessment

## 2020-10-07 NOTE — H&P (Signed)
Luis Haney is an 70 y.o. male.   Chief Complaint: RUL nodule HPI: 70 year old former smoker with COPD, was found to have a spiculated right upper lobe pulmonary nodule when he was hospitalized in December 7591 for complicated right pleural effusion/empyema.  Subsequent PET scan showed that this was hypermetabolic without any evidence of distant disease.  Plan in place for him to undergo navigational bronchoscopy, tissue diagnosis, dye marking in possible resection. He admits to being nervous about the procedure but denies any new problems.  He is ready to proceed.  All questions answered including risks, benefits.    Past Medical History:  Diagnosis Date  . COPD (chronic obstructive pulmonary disease) ([place])   . Dyspnea    occasuional with exertion  . Pleural effusion 06/2020  . Pneumonia     Past Surgical History:  Procedure Laterality Date  . BACK SURGERY  2000  . CHEST TUBE INSERTION Right 06/2020  . NECK SURGERY  2012  . TONSILLECTOMY     removed as a chld    Family History  Problem Relation Age of Onset  . Heart disease Mother   . Other Father        "old age"   Social History:  reports that he quit smoking about 3 months ago. His smoking use included cigarettes. He has a 60.00 pack-year smoking history. He has never used smokeless tobacco. He reports current alcohol use. He reports previous drug use.  Allergies: No Known Allergies  Medications Prior to Admission  Medication Sig Dispense Refill  . calcium carbonate (OS-CAL - DOSED IN MG OF ELEMENTAL CALCIUM) 1250 (500 Ca) MG tablet Take 1 tablet by mouth 3 (three) times daily with meals.    . Fluticasone-Umeclidin-Vilant (TRELEGY ELLIPTA) 100-62.5-25 MCG/INH AEPB Inhale 1 puff into the lungs daily. 60 each 6  . Multiple Vitamin (MULTI VITAMIN MENS PO) Take 1 tablet by mouth daily.    . Potassium 99 MG TABS Take 99 mg by mouth daily.    Marland Kitchen albuterol (VENTOLIN HFA) 108 (90 Base) MCG/ACT inhaler Inhale 2 puffs into the  lungs every 6 (six) hours as needed for wheezing or shortness of breath. 8 g 6    Results for orders placed or performed during the hospital encounter of 10/07/20 (from the past 48 hour(s))  ABO/Rh     Status: None   Collection Time: 10/07/20  6:06 AM  Result Value Ref Range   ABO/RH(D)      A POS Performed at [facility], [place] [address] [place], [place] [address]   Prepare RBC (crossmatch)     Status: None   Collection Time: 10/07/20  7:21 AM  Result Value Ref Range   Order Confirmation      ORDER PROCESSED BY BLOOD BANK Performed at [facility], [place] [address], [place], [place] [address]    DG Chest 2 View  Result Date: 10/05/2020 CLINICAL DATA:  Preoperative evaluation EXAM: CHEST - 2 VIEW COMPARISON:  07/28/2020 FINDINGS: Persistent elevation of the right hemidiaphragm. Right upper lobe nodule is better seen on chest CT. Pleural thickening along the right minor fissure. No significant pleural effusion. No pneumothorax. Cardiomediastinal contours are stable. Cervicothoracic anterior fusion. IMPRESSION: No acute process.  Right upper lobe nodule better seen on chest CT. Electronically Signed   By: Macy Mis M.D.   On: 10/05/2020 12:05    Review of Systems  Blood pressure (!) 159/73, pulse 60, temperature 97.9 F (36.6 C), temperature source Oral, resp.  rate 17, height 6\' 1"  (1.854 m), weight 96.4 kg, SpO2 100 %. Physical Exam  Gen: Pleasant, overweight, in no distress,  normal affect  ENT: No lesions,  mouth clear,  oropharynx clear, no postnasal drip  Neck: No JVD, no stridor  Lungs: No use of accessory muscles, no crackles or wheezing on normal respiration, no wheeze on forced expiration  Cardiovascular: RRR, heart sounds normal, no murmur or gallops, no peripheral edema  Abdomen: soft and NT, no HSM,  BS normal  Musculoskeletal: No deformities, no cyanosis or clubbing  Neuro: alert, awake, non focal  Skin: Warm, no lesions or  rashes   Assessment/Plan Spiculated right upper lobe isolated pulmonary nodule, 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 Plan for navigational bronchoscopy, biopsies, brushings for tissue diagnosis.  Also dye marking to facilitate robotic wedge resection by Dr. Kipp Brood.  Please refer also to his note.  Collene Gobble, MD 10/07/2020, 7:32 AM

## 2020-10-07 NOTE — Op Note (Signed)
Video Bronchoscopy with Electromagnetic Navigation Procedure Note  Date of Operation: 10/07/2020  Pre-op Diagnosis: Right upper lobe pulmonary nodule  Post-op Diagnosis: Same  Surgeon: Baltazar Apo  Assistants: None  Anesthesia: General endotracheal anesthesia  Operation: Flexible video fiberoptic bronchoscopy with electromagnetic navigation and biopsies.  Estimated Blood Loss: Minimal  Complications: None apparent  Indications and History: Luis Haney is a 70 y.o. male with history tobacco use.  He was found to have hypermetabolic right upper lobe pulmonary nodule on imaging when he has been evaluated for pneumonia and a right empyema.  Recommendation was made to achieve tissue diagnosis and die mark the lesion for primary resection by utilizing navigational bronchoscopy.  The risks, benefits, complications, treatment options and expected outcomes were discussed with the patient.  The possibilities of pneumothorax, pneumonia, reaction to medication, pulmonary aspiration, perforation of a viscus, bleeding, failure to diagnose a condition and creating a complication requiring transfusion or operation were discussed with the patient who freely signed the consent.    Description of Procedure: The patient was seen in the Preoperative Area, was examined and was deemed appropriate to proceed.  The patient was taken to [facility] endoscopy room 2, identified as Luis Haney and the procedure verified as Flexible Video Fiberoptic Bronchoscopy.  A Time Out was held and the above information confirmed.   Prior to the date of the procedure a high-resolution CT scan of the chest was performed. Utilizing Louin a virtual tracheobronchial tree was generated to allow the creation of distinct navigation pathways to the patient's parenchymal abnormalities. After being taken to the operating room general anesthesia was initiated and the patient  was orally intubated. The video  fiberoptic bronchoscope was introduced via the endotracheal tube and a general inspection was performed which showed normal airways throughout.  There were no endobronchial lesions or abnormal secretions.  There was an anatomical variant such that there was a proximal accessory takeoff for the apical segment of his right upper lobe. The extendable working channel and locator guide were introduced into the bronchoscope. The distinct navigation pathway prepared prior to this procedure was then utilized to navigate to within 0.5 cm of patient's lesion identified on CT scan. The extendable working channel was secured into place and the locator guide was withdrawn. Under fluoroscopic guidance transbronchial needle brushings, transbronchial Wang needle biopsies, and transbronchial forceps biopsies were performed to be sent for cytology and pathology.  2.5 cc of methylene blue/indocyanine green was injected via transbronchial needle into the nodule to facilitate robotic VATS resection.   At the end of the procedure a general airway inspection was performed and there was no evidence of active bleeding. The bronchoscope was removed.  The patient tolerated the procedure well. There was no significant blood loss and there were no obvious complications.   Samples: 1. Transbronchial needle brushings from RUL nodule 2. Transbronchial Wang needle biopsies from right upper lobe nodule 3. Transbronchial forceps biopsies from right upper lobe nodule  Plans:  The patient will transfer to the operating room to undergo robotic VATS resection of the nodule.  Please refer also to Dr. Abran Duke procedure note.   Baltazar Apo, MD, PhD 10/07/2020, 9:10 AM [facility] [phone] or if no answer before 7:00PM call [phone] For any issues after 7:00PM please call eLink [phone]

## 2020-10-07 NOTE — Op Note (Signed)
[address]       [place] [address]             [phone]        10/07/2020  Patient:  Luis Haney Pre-Op Dx: Right upper lobe pulmonary nodule Post-op Dx: Same Procedure: -Aborted robotic assisted right video thoracoscopy -Right video-assisted thoracoscopy   Surgeon and Role:      * Kemuel Buchmann, Lucile Crater, MD - Primary    *E. Barrett, PA-C- assisting  Anesthesia  general EBL: Minimal  Blood Administration: None Specimen: None  Drains: 28 F Blake chest tube in right chest Counts: correct   Indications: 70 year old male with a 1.2 cm right upper lobe pulmonary nodule is PET avid. Given his smoking history this is concerning for primary lung cancer. There does not appear to be any mediastinal or hilar lymphadenopathy that is avid on PET/CT this would be very early stage. His pulmonary function testing is marginal but this was performed 1 month after being treated for severe pneumonia complicated by parapneumonic effusion. From a functional standpoint I think that he would be able to tolerate, lobectomy. Given the fact that he had a pretty large parapneumonic effusion that was subsequently treated with tube thoracostomy and lytic therapy, there is strong likelihood that that he will multiple adhesions at the time of surgery.  For this reason I elected to perform a combination procedure with pulmonary medicine for a navigational bronchoscopy and biopsy of the nodule prior to surgical resection.  Findings: During the navigational bronchoscopy the results were only consistent with atypical cells.  There was not enough tissue to delineate whether this was a malignant process.  Once he was brought to the operating theater we were able to place a 8 mm trocar but there were multiple dense adhesions completely encasing the long.  I attempted to mobilize the lung off of the chest wall.  I made a 4 cm incision in the anterior axillary line and into the pleural space  after clearing this area with the camera.  Using this access incision I again attempted to mobilize the lung but the adhesions were very dense.  Given the fact that we did not have an adequate diagnosis of lung cancer I did not want to proceed with a thoracotomy for the purposes of biopsy.  I elected to end the procedure at this point.  Operative Technique: After the risks, benefits and alternatives were thoroughly discussed, the patient was brought to the operative theatre.  Anesthesia was induced, and the patient was then placed in a left lateral decubitus position and was prepped and draped in normal sterile fashion.  An appropriate surgical pause was performed, and pre-operative antibiotics were dosed accordingly.  We began by placing our first port in the ninth intercostal space.  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  I attempted to mobilize the lung off of the chest wall using a 0 degrees scope and blunt dissection.  I was able to clear the space up to the anterior axillary line, and then created a 4 cm access incision.  Using a force there I then continued to attempt to mobilize the lung but it was densely adherent.  I knew that the only way to completely mobilize this and perform the wedge resection and lobectomy would be through a thoracotomy.  Given that we do not have a diagnosis I do not want to subject the patient to  a thoracotomy for a potential wedge resection only.  At this point I elected to end the procedure.  A 28 F chest with then placed, and we watch the remaining lobes re-expand.  The skin and soft tissue were closed with absorbable suture    The patient tolerated the procedure without any immediate complications, and was transferred to the PACU in stable condition.  Luis Haney

## 2020-10-07 NOTE — Hospital Course (Addendum)
History of Present Illness:  Mr. Luis Haney is a 70 yo male who was referred to [facility] for possible lung resection.  The patient was diagnosed with right sided pneumonia back in January.  He was subsequently transferred to [facility] for further care.  He underwent right sided chest tube placement with subsequent placement of thrombolytics.  On CT scan obtained an incidental right upper lobe nodule was identified.  Patient recovered without issue.  PET scan scan was obtained and showed resolution of previous pneumonia.  However, the right upper lobe nodule showed increased activity level.  He was evaluated by Dr. Kipp Brood on 2/25 at which point he was no longer smoking.  He felt his respiratory status was back to baseline.  He could ambulate up 2 flights of stairs without incident.  It was felt the patient would benefit from surgical resection.  It was felt he could be done in combination with Pulmonary service.  There was some concern the patient may have adhesions from previous pneumonia, which could pose a challenge during the surgical procedure..  The risks and benefits of the procedure were explained to the patient and he was agreeable to proceed.  Hospital Course:  Mr. Luis Haney presented to [facility] on 10/07/2020.  He was taken to the operating room and underwent Bronchoscopy with electromagnetic navigation and biopsies by Dr. Lamonte Sakai.  Unfortunately, preliminary pathology was un-diagnostic.  He then underwent Right Video Assisted Thoracoscopy.  This procedure ultimately had to be aborted due to extensive adhesions present.  He had a right sided 28 blake drain placed.  He tolerated the procedure without difficulty and was taken to the PACU in stable condition.  The patients chest tube was left on water seal.  CXR showed no significant change.  He remains clinically stable and we are weaning oxygen.  He does have a mild expected acute blood loss anemia.  Renal function is normal.  Pain is under  adequate control with standard regimen.  Incisions are without evidence of infection.  He is tolerating diet and advancing activities.  IR consult was placed for a CT guided biopsy of his right upper lobe lung nodule.  This was performed on 10/08/2020.  Pathology shows***.

## 2020-10-07 NOTE — Transfer of Care (Signed)
Immediate Anesthesia Transfer of Care Note  Patient: Luis Haney  Procedure(s) Performed: Carie Caddy ROBOTIC ASSISTED THORASCOPY-RIGHT UPPER LOBECTOMY (Right Chest)  Patient Location: PACU  Anesthesia Type:General  Level of Consciousness: drowsy and patient cooperative  Airway & Oxygen Therapy: Patient Spontanous Breathing and Patient connected to face mask oxygen  Post-op Assessment: Report given to RN and Post -op Vital signs reviewed and stable  Post vital signs: Reviewed and stable  Last Vitals:  Vitals Value Taken Time  BP 137/68 10/07/20 1108  Temp    Pulse 63 10/07/20 1110  Resp 14 10/07/20 1110  SpO2 94 % 10/07/20 1110  Vitals shown include unvalidated device data.  Last Pain:  Vitals:   10/07/20 0626  TempSrc: Oral  PainSc:       Patients Stated Pain Goal: 3 (15/87/27 6184)  Complications: No complications documented.

## 2020-10-08 ENCOUNTER — Inpatient Hospital Stay (HOSPITAL_COMMUNITY): Payer: Medicare HMO

## 2020-10-08 ENCOUNTER — Encounter (HOSPITAL_COMMUNITY): Payer: Self-pay | Admitting: Emergency Medicine

## 2020-10-08 LAB — BASIC METABOLIC PANEL
Anion gap: 7 (ref 5–15)
BUN: 24 mg/dL — ABNORMAL HIGH (ref 8–23)
CO2: 27 mmol/L (ref 22–32)
Calcium: 8.5 mg/dL — ABNORMAL LOW (ref 8.9–10.3)
Chloride: 101 mmol/L (ref 98–111)
Creatinine, Ser: 1.24 mg/dL (ref 0.61–1.24)
GFR, Estimated: >60 mL/min (ref >60)
Glucose, Bld: 138 mg/dL — ABNORMAL HIGH (ref 70–99)
Potassium: 4.8 mmol/L (ref 3.5–5.1)
Sodium: 135 mmol/L (ref 135–145)

## 2020-10-08 LAB — CBC
HCT: 34 % — ABNORMAL LOW (ref 39.0–52.0)
Hemoglobin: 11 g/dL — ABNORMAL LOW (ref 13.0–17.0)
MCH: 31.6 pg (ref 26.0–34.0)
MCHC: 32.4 g/dL (ref 30.0–36.0)
MCV: 97.7 fL (ref 80.0–100.0)
Platelets: 178 10*3/uL (ref 150–400)
RBC: 3.48 MIL/uL — ABNORMAL LOW (ref 4.22–5.81)
RDW: 13.7 % (ref 11.5–15.5)
WBC: 11.6 10*3/uL — ABNORMAL HIGH (ref 4.0–10.5)
nRBC: 0 % (ref 0.0–0.2)

## 2020-10-08 MED ORDER — LIDOCAINE HCL 1 % IJ SOLN
INTRAMUSCULAR | Status: AC
  Filled 2020-10-08: qty 20

## 2020-10-08 MED ORDER — MIDAZOLAM HCL 2 MG/2ML IJ SOLN
INTRAMUSCULAR | Status: AC
  Filled 2020-10-08: qty 4

## 2020-10-08 MED ORDER — FENTANYL CITRATE (PF) 100 MCG/2ML IJ SOLN
INTRAMUSCULAR | Status: AC | PRN
  Administered 2020-10-08 (×2): 25 ug via INTRAVENOUS

## 2020-10-08 MED ORDER — TRAMADOL HCL 50 MG PO TABS: 50.0000 mg | ORAL_TABLET | Freq: Two times a day (BID) | ORAL | 0 refills | Status: AC | PRN

## 2020-10-08 MED ORDER — FENTANYL CITRATE (PF) 100 MCG/2ML IJ SOLN
INTRAMUSCULAR | Status: AC
  Filled 2020-10-08: qty 4

## 2020-10-08 MED ORDER — MIDAZOLAM HCL 2 MG/2ML IJ SOLN
INTRAMUSCULAR | Status: AC | PRN
  Administered 2020-10-08: 1 mg via INTRAVENOUS
  Administered 2020-10-08: 0.5 mg via INTRAVENOUS

## 2020-10-08 MED ORDER — KETOROLAC TROMETHAMINE 15 MG/ML IJ SOLN
15.0000 mg | Freq: Four times a day (QID) | INTRAMUSCULAR | Status: DC
  Administered 2020-10-08 – 2020-10-09 (×4): 15 mg via INTRAVENOUS
  Filled 2020-10-08 (×4): qty 1

## 2020-10-08 NOTE — Discharge Summary (Signed)
Physician Discharge Summary  Patient ID: Luis Haney MRN: [id number] DOB/AGE: [birth year] 72 y.o.  Admit date: 10/07/2020 Discharge date: 10/09/2020  Admission Diagnoses: Right upper lobe pulmonary nodule  Discharge Diagnoses:  Principal Problem:   Right upper lobe pulmonary nodule Active Problems:   Lung nodule   Patient Active Problem List   Diagnosis Date Noted  . Lung nodule 10/07/2020  . Other emphysema ([place]) 07/29/2020  . Malnutrition of moderate degree 07/09/2020  . Empyema lung ([place]) 06/29/2020  . Acute hypoxemic respiratory failure ([place]) 06/28/2020  . Pleural effusion, right 06/28/2020  . Right upper lobe pulmonary nodule 06/28/2020  . Tobacco use disorder, moderate, dependence 06/28/2020  . Neutrophilic leukocytosis 70/78/6754  . Lobar pneumonia ([place]) 06/28/2020      Discharged Condition: good   Consults: pulmonary/intensive care  Significant Diagnostic Studies: Routine postoperative labs and chest x-ray. Previous PET scan of chest/CT of chest/PFT's  Treatments: surgery:   10/07/2020  Patient:  Luis Haney Pre-Op Dx: Right upper lobe pulmonary nodule Post-op Dx: Same Procedure: -Aborted robotic assisted right video thoracoscopy -Right video-assisted thoracoscopy   Surgeon and Role:      * Lightfoot, Lucile Crater, MD - Primary    *E. Barrett, PA-C- assisting  Anesthesia  general EBL: Minimal  Blood Administration: None Specimen: None Alert NAD Sinus EWOB No leak on CT  10/08/2020- IR CT guided lung biopsy   Discharge Exam: Blood pressure 121/62, pulse (!) 55, temperature 97.7 F (36.5 C), temperature source Oral, resp. rate 16, height 6\' 1"  (1.854 m), weight 96.4 kg, SpO2 96 %.   PATHOLOGY: Results pending from IR biopsy   Disposition: Stable and seen by Dr. Kipp Brood this am and patient is surgically stable for discharge   Allergies as of 10/09/2020   No Known Allergies     Medication List    TAKE these medications    albuterol 108 (90 Base) MCG/ACT inhaler Commonly known as: VENTOLIN HFA Inhale 2 puffs into the lungs every 6 (six) hours as needed for wheezing or shortness of breath.   calcium carbonate 1250 (500 Ca) MG tablet Commonly known as: OS-CAL - dosed in mg of elemental calcium Take 1 tablet by mouth 3 (three) times daily with meals.   MULTI VITAMIN MENS PO Take 1 tablet by mouth daily.   Potassium 99 MG Tabs Take 99 mg by mouth daily.   traMADol 50 MG tablet Commonly known as: ULTRAM Take 1 tablet (50 mg total) by mouth every 12 (twelve) hours as needed for up to 7 days for severe pain (as needed for pain).   Trelegy Ellipta 100-62.5-25 MCG/INH Aepb Generic drug: Fluticasone-Umeclidin-Vilant Inhale 1 puff into the lungs daily.       Follow-up Information    [name], MD. Go on 10/15/2020.   Specialty: Cardiothoracic Surgery Why: Appointment time is at 2:30 pm Contact information: [address] [place] [address] [phone]               Signed: Arnoldo Lenis 10/09/2020, 9:33 AM

## 2020-10-08 NOTE — Procedures (Signed)
Interventional Radiology Procedure Note  Procedure: CT guided biopsy of RUL nodule.  Complications: SIR 1 - pulmonary hemorrhage Recommendations: - Bedrest per primary - Follow up 2 hr CXR pending  - Diet per primary - Management of chest tube per primary - Follow up pathology  Signed,  Corrie Mckusick, DO

## 2020-10-08 NOTE — Plan of Care (Signed)
  Problem: Education: Goal: Knowledge of General Education information will improve Description Including pain rating scale, medication(s)/side effects and non-pharmacologic comfort measures Outcome: Progressing   

## 2020-10-08 NOTE — Anesthesia Postprocedure Evaluation (Signed)
Anesthesia Post Note  Patient: Luis Haney  Procedure(s) Performed: Carie Caddy ROBOTIC ASSISTED THORASCOPY-RIGHT UPPER LOBECTOMY (Right Chest)     Patient location during evaluation: PACU Anesthesia Type: General Level of consciousness: awake and alert Pain management: pain level controlled Vital Signs Assessment: post-procedure vital signs reviewed and stable Respiratory status: spontaneous breathing, nonlabored ventilation, respiratory function stable and patient connected to nasal cannula oxygen Cardiovascular status: blood pressure returned to baseline and stable Postop Assessment: no apparent nausea or vomiting Anesthetic complications: no   No complications documented.               [name]

## 2020-10-08 NOTE — Consult Note (Signed)
Chief Complaint: Patient was seen in consultation today for lung mass  Referring Physician(s): Dr. Melodie Bouillon  Supervising Physician: Corrie Mckusick  Patient Status: [facility] - In-pt  History of Present Illness: Luis Haney is a 70 y.o. male with past medical history of COPD, pneumonia, s/p attempted VATS/right upper lobectomy yesterday (3/17) unsuccessful due to significant fibrothorax with dense adhesions. A chest tube was left in place. IR consulted for lung mass biopsy.   CT Chest 09/30/20: 1. Spiculated solid 1.4 cm medial apical right upper lobe pulmonary nodule, slightly increased in size since 07/04/2020 chest CT, previously hypermetabolic on 93/73/4287 PET-CT, most compatible with primary bronchogenic carcinoma. 2. No thoracic adenopathy or other sites of metastatic disease in the chest on this noncontrast scan. 3. Resolved right pleural effusion. Residual mild smooth basilar right pleural thickening. Residual postinfectious scarring in the mid and basilar right lung.  Case reviewed by Dr. Earleen Newport who approves patient for the procedure today.   Patient assessed at bedside.  Right-sided chest tube remains in place with small amount of serosanguinous output. He is understanding of the goal for biopsy today and is agreeable to proceed.   Past Medical History:  Diagnosis Date  . COPD (chronic obstructive pulmonary disease) ([place])   . Dyspnea    occasuional with exertion  . Pleural effusion 06/2020  . Pneumonia   . Pulmonary nodule 09/2020    Past Surgical History:  Procedure Laterality Date  . BACK SURGERY  2000  . CHEST TUBE INSERTION Right 06/2020  . CHEST TUBE INSERTION Right 10/07/2020  . NECK SURGERY  2012  . TONSILLECTOMY     removed as a chld    Allergies: Patient has no known allergies.  Medications: Prior to Admission medications   Medication Sig Start Date End Date Taking? Authorizing Provider  calcium carbonate (OS-CAL - DOSED IN MG OF  ELEMENTAL CALCIUM) 1250 (500 Ca) MG tablet Take 1 tablet by mouth 3 (three) times daily with meals.   Yes [provider]  Fluticasone-Umeclidin-Vilant (TRELEGY ELLIPTA) 100-62.5-25 MCG/INH AEPB Inhale 1 puff into the lungs daily. 09/06/20  Yes Icard, Octavio Graves, DO  Multiple Vitamin (MULTI VITAMIN MENS PO) Take 1 tablet by mouth daily.   Yes [provider]  Potassium 99 MG TABS Take 99 mg by mouth daily.   Yes [provider]  albuterol (VENTOLIN HFA) 108 (90 Base) MCG/ACT inhaler Inhale 2 puffs into the lungs every 6 (six) hours as needed for wheezing or shortness of breath. 09/06/20   Icard, Octavio Graves, DO  traMADol (ULTRAM) 50 MG tablet Take 1 tablet (50 mg total) by mouth every 12 (twelve) hours as needed for up to 7 days for severe pain (as needed for pain). 10/08/20 10/15/20  John Giovanni, PA-C     Family History  Problem Relation Age of Onset  . Heart disease Mother   . Other Father        "old age"    Social History   Socioeconomic History  . Marital status: Married    Spouse name: Not on file  . Number of children: Not on file  . Years of education: Not on file  . Highest education level: Not on file  Occupational History  . Not on file  Tobacco Use  . Smoking status: Former Smoker    Packs/day: 2.00    Years: 30.00    Pack years: 60.00    Types: Cigarettes    Quit date: 06/2020    Years  since quitting: 0.2  . Smokeless tobacco: Never Used  Vaping Use  . Vaping Use: Never used  Substance and Sexual Activity  . Alcohol use: Yes    Comment: beer occasionally  . Drug use: Not Currently  . Sexual activity: Not on file  Other Topics Concern  . Not on file  Social History Narrative  . Not on file   Social Determinants of Health   Financial Resource Strain: Not on file  Food Insecurity: Not on file  Transportation Needs: Not on file  Physical Activity: Not on file  Stress: Not on file  Social Connections: Not on file     Review of  Systems: A 12 point ROS discussed and pertinent positives are indicated in the HPI above.  All other systems are negative.  Review of Systems  Constitutional: Negative for fatigue and fever.  Respiratory: Positive for cough and shortness of breath.   Cardiovascular: Negative for chest pain.  Gastrointestinal: Negative for abdominal pain, nausea and vomiting.  Genitourinary: Negative for dysuria.  Musculoskeletal: Negative for back pain.  Psychiatric/Behavioral: Negative for behavioral problems and confusion.    Vital Signs: BP (!) 112/92 (BP Location: Right Arm)   Pulse 65   Temp 97.6 F (36.4 C) (Oral)   Resp 17   Ht 6\' 1"  (1.854 m)   Wt 212 lb 8.4 oz (96.4 kg)   SpO2 93%   BMI 28.04 kg/m   Physical Exam Vitals and nursing note reviewed.  Constitutional:      General: He is not in acute distress.    Appearance: Normal appearance. He is not ill-appearing.  HENT:     Mouth/Throat:     Mouth: Mucous membranes are moist.     Pharynx: Oropharynx is clear.  Cardiovascular:     Rate and Rhythm: Normal rate and regular rhythm.  Pulmonary:     Effort: Pulmonary effort is normal. No respiratory distress.     Comments: Decreased breath sounds on right compared to left.  Chest tube in place on right with 60 mL serosanguinous output in Vacutainer.  Abdominal:     General: Abdomen is flat. There is no distension.     Palpations: Abdomen is soft.  Skin:    General: Skin is warm and dry.  Neurological:     General: No focal deficit present.     Mental Status: He is alert and oriented to person, place, and time. Mental status is at baseline.  Psychiatric:        Mood and Affect: Mood normal.        Behavior: Behavior normal.        Thought Content: Thought content normal.        Judgment: Judgment normal.      MD Evaluation Airway: WNL Heart: WNL Abdomen: WNL Chest/ Lungs: WNL ASA  Classification: 2 Mallampati/Airway Score: Three   Imaging: DG Chest 2 View  Result  Date: 10/05/2020 CLINICAL DATA:  Preoperative evaluation EXAM: CHEST - 2 VIEW COMPARISON:  07/28/2020 FINDINGS: Persistent elevation of the right hemidiaphragm. Right upper lobe nodule is better seen on chest CT. Pleural thickening along the right minor fissure. No significant pleural effusion. No pneumothorax. Cardiomediastinal contours are stable. Cervicothoracic anterior fusion. IMPRESSION: No acute process.  Right upper lobe nodule better seen on chest CT. Electronically Signed   By: Macy Mis M.D.   On: 10/05/2020 12:05   DG CHEST PORT 1 VIEW  Result Date: 10/08/2020 CLINICAL DATA:  70 year old male postoperative day 1 status  post transbronchial biopsies, right side thoracoscopy. EXAM: PORTABLE CHEST 1 VIEW COMPARISON:  Portable chest 10/07/2020 and earlier. FINDINGS: Portable AP upright view at 0602 hours. Stable right chest tube terminating at the lateral costophrenic angle. No pneumothorax identified. Mild increased thickening of the right minor fissure. Stable lung volumes and mediastinal contours. Small volume right chest wall gas is stable. Stable visualized osseous structures.  Prior cervical ACDF. Negative visible bowel gas pattern. IMPRESSION: Postoperative day 1 with stable right chest tube. No pneumothorax identified. Mild atelectasis. Electronically Signed   By: Genevie Ann M.D.   On: 10/08/2020 08:29   DG Chest Port 1 View  Result Date: 10/07/2020 CLINICAL DATA:  Follow-up surgery for right upper lung nodule. EXAM: PORTABLE CHEST 1 VIEW COMPARISON:  10/05/2020 FINDINGS: Thoracostomy tube in place in the right lower chest. No visible pneumothorax. Moderate atelectasis in the right lower lung. Left chest remains clear. IMPRESSION: Thoracostomy tube in place on the right. No visible pneumothorax. Moderate atelectasis in the right lower lung. Electronically Signed   By: Nelson Chimes M.D.   On: 10/07/2020 12:51   CT Super D Chest Wo Contrast  Result Date: 10/01/2020 CLINICAL DATA:   Hypermetabolic apical right upper lobe pulmonary nodule. Former smoker. Recent right-sided pneumonia complicated by empyema requiring chest tube and thrombolytic therapy. EXAM: CT CHEST WITHOUT CONTRAST TECHNIQUE: Multidetector CT imaging of the chest was performed using thin slice collimation for electromagnetic bronchoscopy planning purposes, without intravenous contrast. COMPARISON:  08/26/2020 PET-CT.  07/04/2020 chest CT. FINDINGS: Cardiovascular: Normal heart size. No significant pericardial effusion/thickening. Three-vessel coronary atherosclerosis. Atherosclerotic nonaneurysmal thoracic aorta. Top-normal caliber main pulmonary artery (3.0 cm diameter), stable. Mediastinum/Nodes: No discrete thyroid nodules. Unremarkable esophagus. No pathologically enlarged axillary, mediastinal or hilar lymph nodes, noting limited sensitivity for the detection of hilar adenopathy on this noncontrast study. Lungs/Pleura: No pneumothorax. Resolved right pleural effusion. Residual mild smooth basilar right pleural thickening. No left pleural effusion. Mild centrilobular emphysema with diffuse bronchial wall thickening. Spiculated solid 1.4 x 1.4 cm medial apical right upper lobe pulmonary nodule (series 7/image 25), slightly increased from 1.3 x 1.1 cm on 07/04/2020 chest CT. No acute consolidative airspace disease, lung masses or additional significant pulmonary nodules. Residual curvilinear parenchymal bands in the mid and basilar right lung compatible with postinfectious scarring. Upper abdomen: Simple 1.7 cm anterior upper right renal cyst. Simple 1.5 cm posterior interpolar left renal cyst. Musculoskeletal: No aggressive appearing focal osseous lesions. Moderate thoracic spondylosis. Partially visualized surgical hardware from ACDF. IMPRESSION: 1. Spiculated solid 1.4 cm medial apical right upper lobe pulmonary nodule, slightly increased in size since 07/04/2020 chest CT, previously hypermetabolic on 24/26/8341 PET-CT,  most compatible with primary bronchogenic carcinoma. 2. No thoracic adenopathy or other sites of metastatic disease in the chest on this noncontrast scan. 3. Resolved right pleural effusion. Residual mild smooth basilar right pleural thickening. Residual postinfectious scarring in the mid and basilar right lung. 4. Aortic Atherosclerosis (ICD10-I70.0) and Emphysema (ICD10-J43.9). Electronically Signed   By: Ilona Sorrel M.D.   On: 10/01/2020 16:23   DG C-ARM BRONCHOSCOPY  Result Date: 10/07/2020 C-ARM BRONCHOSCOPY: Fluoroscopy was utilized by the requesting physician.  No radiographic interpretation.    Labs:  CBC: Recent Labs    07/10/20 0338 07/15/20 0333 10/05/20 1129 10/08/20 0046  WBC 13.8* 7.4 7.1 11.6*  HGB 9.0* 8.6* 12.8* 11.0*  HCT 28.2* 26.6* 41.1 34.0*  PLT 469* 399 211 178    COAGS: Recent Labs    06/28/20 0952 10/05/20 1129  INR 1.4*  1.0  APTT 41* 28    BMP: Recent Labs    07/10/20 0338 07/15/20 0333 10/05/20 1129 10/08/20 0046  NA 137 137 137 135  K 3.7 3.7 4.4 4.8  CL 102 102 107 101  CO2 24 27 21* 27  GLUCOSE 103* 99 107* 138*  BUN 12 8 23  24*  CALCIUM 7.8* 8.2* 9.3 8.5*  CREATININE 1.01 1.00 1.16 1.24  GFRNONAA >60 >60 >60 >60    LIVER FUNCTION TESTS: Recent Labs    06/28/20 0952 07/10/20 0338 10/05/20 1129  BILITOT 0.6 0.8 1.0  AST 17 25 23   ALT 11 23 15   ALKPHOS 46 37* 50  PROT 6.4* 5.1* 7.1  ALBUMIN 2.8* 1.7* 4.1    TUMOR MARKERS: No results for input(s): AFPTM, CEA, CA199, CHROMGRNA in the last 8760 hours.  Assessment and Plan: Lung mass Patient s/p attempted VATS yesterday but with significant adhesions.  IR now consulted for lung mass biopsy at the request of Dr. Kipp Brood.  CT reviewed by Earleen Newport who approves case.  Chest tube to remain in place today for procedure.  Will plan to proceed today as schedule allows.  Patient did eat breakfast this AM, so will need to NPO several hours today.  LD lovenox was 10pm last night,  none today. INR 1.0 3 days ago.    Risks and benefits of CT guided lung nodule biopsy was discussed with the patient including, but not limited to bleeding, hemoptysis, respiratory failure requiring intubation, infection, pneumothorax requiring chest tube placement, stroke from air embolism or even death.  All of the patient's questions were answered and the patient is agreeable to proceed.  Consent signed and in chart.  Thank you for this interesting consult.  I greatly enjoyed meeting Luis Haney and look forward to participating in their care.  A copy of this report was sent to the requesting provider on this date.  Electronically Signed: Docia Barrier, PA 10/08/2020, 11:43 AM   I spent a total of 40 Minutes    in face to face in clinical consultation, greater than 50% of which was counseling/coordinating care for lung mass.

## 2020-10-08 NOTE — Progress Notes (Addendum)
[address]       ,[place] [address]             [phone]      1 Day Post-Op Procedure(s) (LRB): ABORTED XI ROBOTIC ASSISTED THORASCOPY-RIGHT UPPER LOBECTOMY (Right) Subjective: Some soreness near tube  Objective: Vital signs in last 24 hours: Temp:  [97.4 F (36.3 C)-97.9 F (36.6 C)] 97.9 F (36.6 C) (03/18 0356) Pulse Rate:  [56-70] 57 (03/18 0356) Cardiac Rhythm: Normal sinus rhythm (03/17 1933) Resp:  [10-22] 19 (03/18 0356) BP: (97-150)/(48-74) 97/48 (03/18 0356) SpO2:  [91 %-100 %] 99 % (03/18 0356) Arterial Line BP: (147-153)/(62-71) 147/71 (03/17 1123)  Hemodynamic parameters for last 24 hours:    Intake/Output from previous day: 03/17 0701 - 03/18 0700 In: 2116.7 [P.O.:320; I.V.:1600; IV Piggyback:196.7] Out: 724 [Urine:670; Chest Tube:54] Intake/Output this shift: No intake/output data recorded.  General appearance: alert, cooperative and no distress Heart: regular rate and rhythm Lungs: dim right base Abdomen: benign Extremities: no edema Wound: dressings intact, clean  Lab Results: Recent Labs    10/05/20 1129 10/08/20 0046  WBC 7.1 11.6*  HGB 12.8* 11.0*  HCT 41.1 34.0*  PLT 211 178   BMET:  Recent Labs    10/05/20 1129 10/08/20 0046  NA 137 135  K 4.4 4.8  CL 107 101  CO2 21* 27  GLUCOSE 107* 138*  BUN 23 24*  CREATININE 1.16 1.24  CALCIUM 9.3 8.5*    PT/INR:  Recent Labs    10/05/20 1129  LABPROT 12.8  INR 1.0   ABG    Component Value Date/Time   PHART 7.395 10/05/2020 1146   HCO3 24.2 10/05/2020 1146   ACIDBASEDEF 0.1 10/05/2020 1146   O2SAT 96.5 10/05/2020 1146   CBG (last 3)  No results for input(s): GLUCAP in the last 72 hours.  Meds Scheduled Meds: . acetaminophen  1,000 mg Oral Q6H   Or  . acetaminophen (TYLENOL) oral liquid 160 mg/5 mL  1,000 mg Oral Q6H  . bisacodyl  10 mg Oral Daily  . calcium carbonate  1 tablet Oral TID WC  . enoxaparin (LOVENOX) injection  40 mg  Subcutaneous Q24H  . fluticasone furoate-vilanterol  1 puff Inhalation Daily  . senna-docusate  1 tablet Oral QHS  . umeclidinium bromide  1 puff Inhalation Daily   Continuous Infusions: PRN Meds:.acetaminophen, albuterol, ketorolac, ondansetron (ZOFRAN) IV, traMADol  Xrays DG Chest Port 1 View  Result Date: 10/07/2020 CLINICAL DATA:  Follow-up surgery for right upper lung nodule. EXAM: PORTABLE CHEST 1 VIEW COMPARISON:  10/05/2020 FINDINGS: Thoracostomy tube in place in the right lower chest. No visible pneumothorax. Moderate atelectasis in the right lower lung. Left chest remains clear. IMPRESSION: Thoracostomy tube in place on the right. No visible pneumothorax. Moderate atelectasis in the right lower lung. Electronically Signed   By: Nelson Chimes M.D.   On: 10/07/2020 12:51   DG C-ARM BRONCHOSCOPY  Result Date: 10/07/2020 C-ARM BRONCHOSCOPY: Fluoroscopy was utilized by the requesting physician.  No radiographic interpretation.    Assessment/Plan: S/P Procedure(s) (LRB): ABORTED XI ROBOTIC ASSISTED THORASCOPY-RIGHT UPPER LOBECTOMY (Right)  1 afeb, VSS, rare systolic HTN 2 sats good on 2 liters- wean as able 3 minor leukocytosis , prob systemic inflam response 4 expected ABLA- relat minor- monitor clinically 5 slight bump in creat- still in normal range, I think we can cont prn toradol for now 6 CT - 54 cc drainage recorded, will leave tube in till biopsy completed  7 CXR stable , no pntx 8 routine pulm toilet and mobilize 9 lung bx in radiology today   LOS: 1 day    John Giovanni PA-C Pager [phone] 10/08/2020  Agree with above CT guided biopsy done this evening Chest tube removal  [place]

## 2020-10-09 ENCOUNTER — Inpatient Hospital Stay (HOSPITAL_COMMUNITY): Payer: Medicare HMO

## 2020-10-09 DIAGNOSIS — J9811 Atelectasis: Secondary | ICD-10-CM | POA: Diagnosis not present

## 2020-10-09 DIAGNOSIS — J90 Pleural effusion, not elsewhere classified: Secondary | ICD-10-CM | POA: Diagnosis not present

## 2020-10-09 LAB — CBC
HCT: 33.2 % — ABNORMAL LOW (ref 39.0–52.0)
Hemoglobin: 10.6 g/dL — ABNORMAL LOW (ref 13.0–17.0)
MCH: 31.5 pg (ref 26.0–34.0)
MCHC: 31.9 g/dL (ref 30.0–36.0)
MCV: 98.5 fL (ref 80.0–100.0)
Platelets: 169 10*3/uL (ref 150–400)
RBC: 3.37 MIL/uL — ABNORMAL LOW (ref 4.22–5.81)
RDW: 13.6 % (ref 11.5–15.5)
WBC: 8.9 10*3/uL (ref 4.0–10.5)
nRBC: 0 % (ref 0.0–0.2)

## 2020-10-09 LAB — COMPREHENSIVE METABOLIC PANEL
ALT: 12 U/L (ref 0–44)
AST: 19 U/L (ref 15–41)
Albumin: 3 g/dL — ABNORMAL LOW (ref 3.5–5.0)
Alkaline Phosphatase: 40 U/L (ref 38–126)
Anion gap: 5 (ref 5–15)
BUN: 26 mg/dL — ABNORMAL HIGH (ref 8–23)
CO2: 28 mmol/L (ref 22–32)
Calcium: 9.8 mg/dL (ref 8.9–10.3)
Chloride: 103 mmol/L (ref 98–111)
Creatinine, Ser: 1.26 mg/dL — ABNORMAL HIGH (ref 0.61–1.24)
GFR, Estimated: >60 mL/min (ref >60)
Glucose, Bld: 102 mg/dL — ABNORMAL HIGH (ref 70–99)
Potassium: 4 mmol/L (ref 3.5–5.1)
Sodium: 136 mmol/L (ref 135–145)
Total Bilirubin: 0.8 mg/dL (ref 0.3–1.2)
Total Protein: 5.7 g/dL — ABNORMAL LOW (ref 6.5–8.1)

## 2020-10-09 NOTE — Progress Notes (Signed)
Discharged home accompanied by wife , belongings taken home.

## 2020-10-09 NOTE — Progress Notes (Signed)
All set for discharge home, awaiting ride home.

## 2020-10-09 NOTE — Progress Notes (Signed)
     [address]       [place] [address]             [phone]       No events  Vitals:   10/09/20 0300 10/09/20 0720  BP: (!) 125/55 121/62  Pulse: 67 (!) 55  Resp: 19 16  Temp:  97.7 F (36.5 C)  SpO2: 100% 100%   Alert NAD Sinus EWOB No leak on CT  CT biopsy done  S/p aborted R RATS Will remove CT. Home today  [name]

## 2020-10-12 ENCOUNTER — Telehealth: Payer: Self-pay

## 2020-10-12 LAB — CYTOLOGY - NON PAP

## 2020-10-12 LAB — SURGICAL PATHOLOGY

## 2020-10-12 NOTE — Telephone Encounter (Signed)
Patient contacted the office concerned about a rash on the right flank to abdomen. He stated that it "itches at times" and first noticed it after being discharged from the hospital 3 days ago.  He is s/p RATS RULobectomy with Dr. Kipp Brood 10/07/20.  Patient did have some tape in the areas of rash and noted to have EKG electrode redness on his chest indicative of allergic reaction to adhesive tape.  Advised to remove all tape and dressings from his side and take a shower. Let soap and water run down incision sites, and pat dry.  He could be allergic to the prep used during his hospital stay.  Also advised that he can take a benadryl to help with the itching and rash.  Patient advised to contact the office if rash gets worse or does not improve.  Patient acknowledged receipt.  Patient also has a follow-up appointment with Dr. Kipp Brood this Friday, 10/15/20. Patient is aware.

## 2020-10-14 LAB — TYPE AND SCREEN
ABO/RH(D): A POS
Antibody Screen: NEGATIVE
Unit division: 0
Unit division: 0

## 2020-10-14 LAB — BPAM RBC
Blood Product Expiration Date: 202204122359
Blood Product Expiration Date: 202204122359
Unit Type and Rh: 6200
Unit Type and Rh: 6200

## 2020-10-15 ENCOUNTER — Ambulatory Visit (INDEPENDENT_AMBULATORY_CARE_PROVIDER_SITE_OTHER): Payer: Self-pay | Admitting: Thoracic Surgery (Cardiothoracic Vascular Surgery)

## 2020-10-15 ENCOUNTER — Encounter: Payer: Self-pay | Admitting: Thoracic Surgery (Cardiothoracic Vascular Surgery)

## 2020-10-15 ENCOUNTER — Other Ambulatory Visit: Payer: Self-pay

## 2020-10-15 VITALS — BP 140/78 | HR 60 | Resp 20 | Ht 73.0 in | Wt 208.0 lb

## 2020-10-15 DIAGNOSIS — R911 Solitary pulmonary nodule: Secondary | ICD-10-CM

## 2020-10-15 DIAGNOSIS — Z09 Encounter for follow-up examination after completed treatment for conditions other than malignant neoplasm: Secondary | ICD-10-CM

## 2020-10-15 NOTE — Progress Notes (Signed)
° °   °  [address]       [place] [address]             [phone]        Luis Haney [place] Medical Record #[id number] Date of Birth: 03-23-1951  Referring: Garner Nash, DO Primary Care: [facility] Primary Cardiologist:No primary care provider on file.  Reason for visit:   follow-up  History of Present Illness:     Luis Haney presents for his 1 week follow-up appointment.  He has no complaints.  Physical Exam: BP 140/78    Pulse 60    Resp 20    Ht 6\' 1"  (1.854 m)    Wt 208 lb (94.3 kg)    SpO2 98% Comment: RA   BMI 27.44 kg/m   Alert NAD Incision clean stitch removed,.   Abdomen soft, ND No peripheral edema   Diagnostic Studies & Laboratory data:  Path: Squamous cell cancer.    Assessment / Plan:   Luis Haney underwent navigational bronchoscopy combination the plan robotic assisted right upper lobectomy.  Unfortunately the navigational bronchoscopy was nondiagnostic and when we attempted to access his pleural space during the robotic surgery he had multiple dense adhesions creating a fibrothorax.  This was undoubtedly due to his previous parapneumonic effusion that was treated with fibrinolytics.  We attempted to mobilize the lung via thoracoscopy but was unsuccessful.  Given that we did not have a diagnosis in the operating room I elected not to perform a thoracoscopy in case this was not a cancer.  He subsequently underwent a CT-guided biopsy during the hospitalization which confirmed that he had squamous cell cancer.  We had a long discussion today in clinic and the patient reiterated that he does not want to undergo thoracotomy for surgical cure.  He would rather be treated with radiation therapy.  I have made a referral to a radiation oncologist for further treatment.  I will see Luis Haney back in 1 month with a chest x-ray.   [name] 10/15/2020 2:52 PM

## 2020-10-18 NOTE — Progress Notes (Signed)
Thoracic Location of Tumor / Histology: Right Upper Lobe  Patient presented in December 8329 with complications of right pleural effusion/ empyema.  He was found to have a spiculated right upper lobe pulmonary nodule.  CT Super D Chest 09/30/2020:Spiculated solid 1.4 cm medial apical right upper lobe pulmonary nodule, slightly increased in size since 07/04/2020 chest CT, previously hypermetabolic on 19/16/6060 PET-CT, most compatible with primary bronchogenic carcinoma.  No thoracic adenopathy or other sites of metastatic disease in the chest on this noncontrast scan.  PET 0/10/5995: Hypermetabolic nodule at the RIGHT lung apex is most consistent primary bronchogenic carcinoma. No evidence of mediastinal metastatic adenopathy or distant metastatic disease. FDG PET staging 1A.  Hypermetabolic thickening in the anteromedial margin of the LEFT diaphragm is favored benign residual infection/inflammation from recent empyema.  Biopsies of RUL Lung Mass 10/08/2020   Tobacco/Marijuana/Snuff/ETOH use: Former Smoker  Past/Anticipated interventions by cardiothoracic surgery, if any:  Dr. Kipp Brood 10/15/2020 -Mr. Newton underwent navigational bronchoscopy combination the plan robotic assisted right upper lobectomy. -Unfortunately the navigational bronchoscopy was nondiagnostic and when we attempted to access his pleural space during the robotic surgery he had multiple dense adhesions creating a fibrothorax. -Given that we did not have a diagnosis in the operating room I elected not to perform a thoracoscopy in case this was not a cancer.  He subsequently underwent a CT-guided biopsy during the hospitalization which confirmed that he had squamous cell cancer. -We had a long discussion today in clinic and the patient reiterated that he does not want to undergo thoracotomy for surgical cure.  He would rather be treated with radiation therapy.   -I have made a referral to a radiation oncologist for further  treatment.  Past/Anticipated interventions by medical oncology, if any:    Signs/Symptoms  Weight changes, if any: Lost some weight while in the hospital, has gained it back now.  Respiratory complaints, if any: SOB with exertion.  Hemoptysis, if any: Cough at baseline due to COPD, occasionally productive with clear phlegm.  Pain issues, if any:  Has some pain at the incision site of recent surgery.  SAFETY ISSUES:  Prior radiation? No  Pacemaker/ICD? No  Possible current pregnancy? n/a  Is the patient on methotrexate? no  Current Complaints / other details:

## 2020-10-19 ENCOUNTER — Encounter: Payer: Self-pay | Admitting: Radiation Oncology

## 2020-10-19 ENCOUNTER — Ambulatory Visit
Admission: RE | Admit: 2020-10-19 | Discharge: 2020-10-19 | Disposition: A | Discharge status: Home / Self Care | Payer: Medicare HMO | Source: Ambulatory Visit | Attending: Radiation Oncology | Admitting: Radiation Oncology

## 2020-10-19 VITALS — Ht 73.0 in | Wt 204.0 lb

## 2020-10-19 DIAGNOSIS — C3411 Malignant neoplasm of upper lobe, right bronchus or lung: Secondary | ICD-10-CM | POA: Insufficient documentation

## 2020-10-19 DIAGNOSIS — Z87891 Personal history of nicotine dependence: Secondary | ICD-10-CM | POA: Diagnosis not present

## 2020-10-19 NOTE — Progress Notes (Signed)
Radiation Oncology         [phone] ________________________________  Name: Luis Haney        MRN: [id number]  Date of Service: 10/19/2020 DOB: Nov 19, 1950  SE:[facility], [name]  [place], MD     REFERRING PHYSICIAN: [name], MD   DIAGNOSIS: The encounter diagnosis was Malignant neoplasm of upper lobe of right lung ([place]).   HISTORY OF PRESENT ILLNESS: Luis Haney is a 70 y.o. male seen at the request of Dr. Kipp Brood for a newly diagnosed lung cancer.  The patient was originally found to have a lesion in the right upper lobe and work-up for possible primary lung cancer began, he did have a PET scan that showed hypermetabolism and also have a severe pneumonia complicated by a parapneumonic effusion.  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  The procedure was subsequently aborted.  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  Given the patient's pulmonary function, and findings at the time of his most recent surgery, he was offered the alternative to more surgery with stereotactic body radiotherapy and is seen to discuss this today.    PREVIOUS RADIATION THERAPY: No   PAST MEDICAL HISTORY:  Past Medical History:  Diagnosis Date  . COPD (chronic obstructive pulmonary disease) ([place])   . Dyspnea    occasuional with exertion  . Pleural effusion 06/2020  . Pneumonia   . Pulmonary nodule 09/2020       PAST SURGICAL HISTORY: Past Surgical History:  Procedure Laterality Date  . BACK SURGERY  2000  . BRONCHIAL BIOPSY   10/07/2020   Procedure: BRONCHIAL BIOPSIES;  Surgeon: Collene Gobble, MD;  Location: [facility] ENDOSCOPY;  Service: Pulmonary;;  . BRONCHIAL BRUSHINGS  10/07/2020   Procedure: BRONCHIAL BRUSHINGS;  Surgeon: Collene Gobble, MD;  Location: [facility] ENDOSCOPY;  Service: Pulmonary;;  . BRONCHIAL NEEDLE ASPIRATION BIOPSY  10/07/2020   Procedure: BRONCHIAL NEEDLE ASPIRATION BIOPSIES;  Surgeon: Collene Gobble, MD;  Location: [facility] ENDOSCOPY;  Service: Pulmonary;;  . CHEST TUBE INSERTION Right 06/2020  . CHEST TUBE INSERTION Right 10/07/2020  . NECK SURGERY  2012  . SUBMUCOSAL INJECTION  10/07/2020   Procedure: FIDUCIAL DYE MARKING;  Surgeon: Collene Gobble, MD;  Location: [facility] ENDOSCOPY;  Service: Pulmonary;;  . TONSILLECTOMY     removed as a chld  . VIDEO BRONCHOSCOPY WITH ENDOBRONCHIAL NAVIGATION N/A 10/07/2020   Procedure: VIDEO BRONCHOSCOPY WITH ENDOBRONCHIAL NAVIGATION;  Surgeon: Collene Gobble, MD;  Location: [place] ENDOSCOPY;  Service: Pulmonary;  Laterality: N/A;     FAMILY HISTORY:  Family History  Problem Relation Age of Onset  . Heart disease Mother   . Other Father        "old age"     SOCIAL HISTORY:  reports that he quit smoking about 3 months ago. His smoking use included cigarettes. He has a 60.00 pack-year smoking history. He has never used smokeless tobacco. He reports current alcohol use. He reports previous drug use.   ALLERGIES: Patient has no known allergies.   MEDICATIONS:  Current Outpatient Medications  Medication Sig Dispense Refill  . albuterol (VENTOLIN HFA)  108 (90 Base) MCG/ACT inhaler Inhale 2 puffs into the lungs every 6 (six) hours as needed for wheezing or shortness of breath. 8 g 6  . calcium carbonate (OS-CAL - DOSED IN MG OF ELEMENTAL CALCIUM) 1250 (500 Ca) MG tablet Take 1 tablet by mouth 3 (three) times daily with meals.    . Fluticasone-Umeclidin-Vilant (TRELEGY ELLIPTA) 100-62.5-25 MCG/INH AEPB Inhale 1 puff into the lungs daily. 60 each 6  . Multiple Vitamin (MULTI  VITAMIN MENS PO) Take 1 tablet by mouth daily.    . Potassium 99 MG TABS Take 99 mg by mouth daily.     No current facility-administered medications for this encounter.   Facility-Administered Medications Ordered in Other Encounters  Medication Dose Route Frequency Provider Last Rate Last Admin  . albuterol (PROVENTIL) (2.5 MG/3ML) 0.083% nebulizer solution 2.5 mg  2.5 mg Nebulization Once Martyn Ehrich, NP         REVIEW OF SYSTEMS: On review of systems, the patient reports that he is doing well overall. He lost a few pounds since his hospital stay in December 2021, but he has since gained it back. He has had to use an inhaler due to his copd. He denies hemoptysis,chest pain, shortness of breath, fevers, chills, night sweats, unintended weight changes. He denies any bowel or bladder disturbances, and denies abdominal pain, nausea or vomiting. He denies any new musculoskeletal or joint aches or pains. A complete review of systems is obtained and is otherwise negative.     PHYSICAL EXAM:  Wt Readings from Last 3 Encounters:  10/15/20 208 lb (94.3 kg)  10/07/20 212 lb 8.4 oz (96.4 kg)  10/05/20 212 lb 9.6 oz (96.4 kg)   Unable to assess due to encounter type.   ECOG = 0  0 - Asymptomatic (Fully active, able to carry on all predisease activities without restriction)  1 - Symptomatic but completely ambulatory (Restricted in physically strenuous activity but ambulatory and able to carry out work of a light or sedentary nature. For example, light housework, office work)  2 - Symptomatic, <50% in bed during the day (Ambulatory and capable of all self care but unable to carry out any work activities. Up and about more than 50% of waking hours)  3 - Symptomatic, >50% in bed, but not bedbound (Capable of only limited self-care, confined to bed or chair 50% or more of waking hours)  4 - Bedbound (Completely disabled. Cannot carry on any self-care. Totally confined to bed or chair)  5 -  Death   Eustace Pen MM, Creech RH, Tormey DC, et al. [phone]). "Toxicity and response criteria of the [facility]". [place] Oncol. 5 (6): 649-55    LABORATORY DATA:  Lab Results  Component Value Date   WBC 8.9 10/09/2020   HGB 10.6 (L) 10/09/2020   HCT 33.2 (L) 10/09/2020   MCV 98.5 10/09/2020   PLT 169 10/09/2020   Lab Results  Component Value Date   NA 136 10/09/2020   K 4.0 10/09/2020   CL 103 10/09/2020   CO2 28 10/09/2020   Lab Results  Component Value Date   ALT 12 10/09/2020   AST 19 10/09/2020   ALKPHOS 40 10/09/2020   BILITOT 0.8 10/09/2020      RADIOGRAPHY: DG Chest 2 View  Result Date: 10/05/2020 CLINICAL DATA:  Preoperative evaluation EXAM: CHEST - 2 VIEW COMPARISON:  07/28/2020 FINDINGS: Persistent elevation of the right hemidiaphragm. Right upper lobe nodule is better seen on chest CT.  Pleural thickening along the right minor fissure. No significant pleural effusion. No pneumothorax. Cardiomediastinal contours are stable. Cervicothoracic anterior fusion. IMPRESSION: No acute process.  Right upper lobe nodule better seen on chest CT. Electronically Signed   By: Macy Mis M.D.   On: 10/05/2020 12:05   DG CHEST PORT 1 VIEW  Result Date: 10/09/2020 CLINICAL DATA:  Surgery, follow-up. Additional history provided: Status post attempted bat/right upper lobectomy 3/17, unsuccessful due to fibrothorax with dense adhesions. EXAM: PORTABLE CHEST 1 VIEW COMPARISON:  CT images from attempted biopsy 10/08/2020. Chest radiograph 10/08/2020. FINDINGS: Unchanged position of a right chest tube at the right lung base. No pneumothorax is identified. The cardiomediastinal silhouette is unchanged. Aortic atherosclerosis. Unchanged thickening of the right minor fissure. Persistent mild right basilar atelectasis and possible trace right pleural effusion. ACDF hardware within the lower cervical spine. Redemonstrated small volume soft tissue gas within the right chest  wall. IMPRESSION: No significant change as compared to the chest radiograph of 10/08/2020. Unchanged position of a right chest tube. No appreciable pneumothorax. Persistent mild thickening of the right minor fissure and mild right basilar atelectasis. A trace right pleural effusion may also be present. Electronically Signed   By: Kellie Simmering DO   On: 10/09/2020 09:52   DG CHEST PORT 1 VIEW  Result Date: 10/08/2020 CLINICAL DATA:  70 year old male postoperative day 1 status post transbronchial biopsies, right side thoracoscopy. EXAM: PORTABLE CHEST 1 VIEW COMPARISON:  Portable chest 10/07/2020 and earlier. FINDINGS: Portable AP upright view at 0602 hours. Stable right chest tube terminating at the lateral costophrenic angle. No pneumothorax identified. Mild increased thickening of the right minor fissure. Stable lung volumes and mediastinal contours. Small volume right chest wall gas is stable. Stable visualized osseous structures.  Prior cervical ACDF. Negative visible bowel gas pattern. IMPRESSION: Postoperative day 1 with stable right chest tube. No pneumothorax identified. Mild atelectasis. Electronically Signed   By: Genevie Ann M.D.   On: 10/08/2020 08:29   DG Chest Port 1 View  Result Date: 10/07/2020 CLINICAL DATA:  Follow-up surgery for right upper lung nodule. EXAM: PORTABLE CHEST 1 VIEW COMPARISON:  10/05/2020 FINDINGS: Thoracostomy tube in place in the right lower chest. No visible pneumothorax. Moderate atelectasis in the right lower lung. Left chest remains clear. IMPRESSION: Thoracostomy tube in place on the right. No visible pneumothorax. Moderate atelectasis in the right lower lung. Electronically Signed   By: Nelson Chimes M.D.   On: 10/07/2020 12:51   CT LUNG MASS BIOPSY  Result Date: 10/08/2020 INDICATION: 70 year old male with a history of right upper lobe lung nodule, FDG positive EXAM: IMAGE GUIDED BIOPSY RIGHT UPPER LOBE NODULE MEDICATIONS: None. ANESTHESIA/SEDATION: Moderate  (conscious) sedation was employed during this procedure. A total of Versed 1.5 mg and Fentanyl 50 mcg was administered intravenously. Moderate Sedation Time: 47 minutes. The patient's level of consciousness and vital signs were monitored continuously by radiology nursing throughout the procedure under my direct supervision. FLUOROSCOPY TIME:  CT COMPLICATIONS: None PROCEDURE: The procedure, risks, benefits, and alternatives were explained to the patient and the patient's family. Specific risks that were addressed included bleeding, infection, pneumothorax, need for further procedure including chest tube placement, chance of delayed pneumothorax or hemorrhage, hemoptysis, nondiagnostic sample, cardiopulmonary collapse, death. Questions regarding the procedure were encouraged and answered. The patient understands and consents to the procedure. Patient was positioned in the supine position on the CT gantry table and a scout CT of the chest was performed for planning purposes. Once angle of  approach was determined, the skin and subcutaneous tissues this scan was prepped and draped in the usual sterile fashion, and a sterile drape was applied covering the operative field. A sterile gown and sterile gloves were used for the procedure. Local anesthesia was provided with 1% Lidocaine. The skin and subcutaneous tissues were infiltrated 1% lidocaine for local anesthesia, and a small stab incision was made with an 11 blade scalpel. Using CT guidance, attempt to advance a 17 gauge trocar needle was performed into the right upper lobe nodule. The initial positioning and respiratory motion allowed only an inferior to superior trajectory. The initial needle position from a right axillary approach trajectory resulted in hemoptysis and parenchymal hemorrhage within the pulmonary tissue. After this initial attempt, the needle was withdrawn and the patient was observed for 5 minutes. Repeat CT confirmed that there was stability within  the pulmonary hemorrhage with no expansion. Given the patient remained hemodynamically normal and comfortable, we proceeded with additional attempt at biopsy. At this point, the patient had developed a better window for approach. 65 gauge guide needle was then again advanced with CT guidance into the right upper lobe from a more direct, orthogonal approach to the CT images. After confirmation of the tip, separate 18 gauge core biopsies were performed. These were placed into solution for transportation to the lab. A final CT image was performed. Patient tolerated the procedure well and remained hemodynamically stable throughout. No complications were encountered and no significant blood loss was encounter IMPRESSION: Status post CT-guided biopsy of right upper lobe nodule. Tissue specimen sent to pathology for complete histopathologic analysis. Signed, Dulcy Fanny. Dellia Nims, RPVI [facility] Radiology Electronically Signed   By: Corrie Mckusick D.O.   On: 10/08/2020 17:32   CT Super D Chest Wo Contrast  Result Date: 10/01/2020 CLINICAL DATA:  Hypermetabolic apical right upper lobe pulmonary nodule. Former smoker. Recent right-sided pneumonia complicated by empyema requiring chest tube and thrombolytic therapy. EXAM: CT CHEST WITHOUT CONTRAST TECHNIQUE: Multidetector CT imaging of the chest was performed using thin slice collimation for electromagnetic bronchoscopy planning purposes, without intravenous contrast. COMPARISON:  08/26/2020 PET-CT.  07/04/2020 chest CT. FINDINGS: Cardiovascular: Normal heart size. No significant pericardial effusion/thickening. Three-vessel coronary atherosclerosis. Atherosclerotic nonaneurysmal thoracic aorta. Top-normal caliber main pulmonary artery (3.0 cm diameter), stable. Mediastinum/Nodes: No discrete thyroid nodules. Unremarkable esophagus. No pathologically enlarged axillary, mediastinal or hilar lymph nodes, noting limited  sensitivity for the detection of hilar adenopathy on this noncontrast study. Lungs/Pleura: No pneumothorax. Resolved right pleural effusion. Residual mild smooth basilar right pleural thickening. No left pleural effusion. Mild centrilobular emphysema with diffuse bronchial wall thickening. Spiculated solid 1.4 x 1.4 cm medial apical right upper lobe pulmonary nodule (series 7/image 25), slightly increased from 1.3 x 1.1 cm on 07/04/2020 chest CT. No acute consolidative airspace disease, lung masses or additional significant pulmonary nodules. Residual curvilinear parenchymal bands in the mid and basilar right lung compatible with postinfectious scarring. Upper abdomen: Simple 1.7 cm anterior upper right renal cyst. Simple 1.5 cm posterior interpolar left renal cyst. Musculoskeletal: No aggressive appearing focal osseous lesions. Moderate thoracic spondylosis. Partially visualized surgical hardware from ACDF. IMPRESSION: 1. Spiculated solid 1.4 cm medial apical right upper lobe pulmonary nodule, slightly increased in size since 07/04/2020 chest CT, previously hypermetabolic on 67/06/4579 PET-CT, most compatible with primary bronchogenic carcinoma. 2. No thoracic adenopathy or other sites of metastatic disease in the chest on this noncontrast scan. 3. Resolved right pleural effusion. Residual mild smooth basilar right  pleural thickening. Residual postinfectious scarring in the mid and basilar right lung. 4. Aortic Atherosclerosis (ICD10-I70.0) and Emphysema (ICD10-J43.9). Electronically Signed   By: Ilona Sorrel M.D.   On: 10/01/2020 16:23   DG C-ARM BRONCHOSCOPY  Result Date: 10/07/2020 C-ARM BRONCHOSCOPY: Fluoroscopy was utilized by the requesting physician.  No radiographic interpretation.       IMPRESSION/PLAN: 1. Stage IA2, cT1bN0M0 NSCLC, squamous cell carcinoma of the RUL. Dr. Lisbeth Renshaw discusses the pathology findings and reviews the nature of early lung cancers. His course has been complex due to  fibrosis and parapneumonic effusion and fibrothorax. He is not an optimal surgical candidate, and while it is still an option to consider, the patient prefers to avoid additional surgery. Dr. Lisbeth Renshaw discusses that while surgery remains the gold standard for curative approach, that for patients who prefer alternative treatment options, stereotactic body radiotherapy (SBRT) is an appropriate treatment modality. We discussed the risks, benefits, short, and long term effects of radiotherapy, as well as the curative intent, and the patient is interested in proceeding. Dr. Lisbeth Renshaw discusses the delivery and logistics of radiotherapy and anticipates a course of 3-5 fractions of radiotherapy over 1-2 weeks. He will come in for simulation on 10/25/20 at which time he will sign written consent to proceed.  This encounter was provided by telemedicine platform MyChart.  The patient has provided two factor identification and has given verbal consent for this type of encounter and has been advised to only accept a meeting of this type in a secure network environment. The time spent during this encounter was 60 minutes including preparation, discussion, and coordination of the patient's care. The attendants for this meeting include Blenda Nicely, RN, Dr. Lisbeth Renshaw, Hayden Pedro  and Joaquin Courts.  During the encounter,  Blenda Nicely, RN, Dr. Lisbeth Renshaw, and Hayden Pedro were located at [facility] Radiation Oncology Department.  Joaquin Courts was located at home.    The above documentation reflects my direct findings during this shared patient visit. Please see the separate note by Dr. Lisbeth Renshaw on this date for the remainder of the patient's plan of care.    Carola Rhine, [facility]   **Disclaimer: This note was dictated with voice recognition software. Similar sounding words can inadvertently be transcribed and this note may contain transcription errors which may not have been corrected upon  publication of note.**

## 2020-10-20 ENCOUNTER — Encounter: Payer: Self-pay | Admitting: Licensed Clinical Social Worker

## 2020-10-20 NOTE — Progress Notes (Signed)
[place] Psychosocial Distress Screening Clinical Social Work  Clinical Social Work was referred by distress screening protocol.  The patient scored a 8 on the Psychosocial Distress Thermometer which indicates severe distress. Clinical Social Worker attempted to contact patient by phone to assess for distress and other psychosocial needs.  No answer. Left detailed message explaining support services, including direct contact information.  ONCBCN DISTRESS SCREENING 10/19/2020  Screening Type Initial Screening  Distress experienced in past week (1-10) 8  Emotional problem type Nervousness/Anxiety;Adjusting to illness  Information Concerns Type Lack of info about diagnosis;Lack of info about treatment  Other Contact via phone      Brandalyn Harting E Myisha Pickerel, LCSW

## 2020-10-23 DIAGNOSIS — Z6827 Body mass index (BMI) 27.0-27.9, adult: Secondary | ICD-10-CM | POA: Diagnosis not present

## 2020-10-23 DIAGNOSIS — E663 Overweight: Secondary | ICD-10-CM | POA: Diagnosis not present

## 2020-10-23 DIAGNOSIS — Z87891 Personal history of nicotine dependence: Secondary | ICD-10-CM | POA: Diagnosis not present

## 2020-10-23 DIAGNOSIS — Z809 Family history of malignant neoplasm, unspecified: Secondary | ICD-10-CM | POA: Diagnosis not present

## 2020-10-23 DIAGNOSIS — J439 Emphysema, unspecified: Secondary | ICD-10-CM | POA: Diagnosis not present

## 2020-10-23 DIAGNOSIS — Z825 Family history of asthma and other chronic lower respiratory diseases: Secondary | ICD-10-CM | POA: Diagnosis not present

## 2020-10-23 DIAGNOSIS — C3490 Malignant neoplasm of unspecified part of unspecified bronchus or lung: Secondary | ICD-10-CM | POA: Diagnosis not present

## 2020-10-23 DIAGNOSIS — Z7722 Contact with and (suspected) exposure to environmental tobacco smoke (acute) (chronic): Secondary | ICD-10-CM | POA: Diagnosis not present

## 2020-10-23 DIAGNOSIS — Z7951 Long term (current) use of inhaled steroids: Secondary | ICD-10-CM | POA: Diagnosis not present

## 2020-10-25 ENCOUNTER — Ambulatory Visit
Admission: RE | Admit: 2020-10-25 | Discharge: 2020-10-25 | Disposition: A | Discharge status: Home / Self Care | Payer: Medicare HMO | Source: Ambulatory Visit | Attending: Radiation Oncology | Admitting: Radiation Oncology

## 2020-10-25 ENCOUNTER — Other Ambulatory Visit: Payer: Self-pay

## 2020-10-25 DIAGNOSIS — C3411 Malignant neoplasm of upper lobe, right bronchus or lung: Secondary | ICD-10-CM | POA: Diagnosis not present

## 2020-10-25 DIAGNOSIS — Z87891 Personal history of nicotine dependence: Secondary | ICD-10-CM | POA: Diagnosis not present

## 2020-10-25 DIAGNOSIS — Z510 Encounter for antineoplastic radiation therapy: Secondary | ICD-10-CM | POA: Insufficient documentation

## 2020-11-01 ENCOUNTER — Ambulatory Visit: Payer: Medicare HMO | Admitting: Radiation Oncology

## 2020-11-01 DIAGNOSIS — C3411 Malignant neoplasm of upper lobe, right bronchus or lung: Secondary | ICD-10-CM | POA: Diagnosis not present

## 2020-11-01 DIAGNOSIS — Z510 Encounter for antineoplastic radiation therapy: Secondary | ICD-10-CM | POA: Diagnosis not present

## 2020-11-01 DIAGNOSIS — Z87891 Personal history of nicotine dependence: Secondary | ICD-10-CM | POA: Diagnosis not present

## 2020-11-02 ENCOUNTER — Ambulatory Visit
Admission: RE | Admit: 2020-11-02 | Discharge: 2020-11-02 | Disposition: A | Discharge status: Home / Self Care | Payer: Medicare HMO | Source: Ambulatory Visit | Attending: Radiation Oncology | Admitting: Radiation Oncology

## 2020-11-02 ENCOUNTER — Other Ambulatory Visit: Payer: Self-pay

## 2020-11-02 DIAGNOSIS — Z510 Encounter for antineoplastic radiation therapy: Secondary | ICD-10-CM | POA: Diagnosis not present

## 2020-11-02 DIAGNOSIS — C3411 Malignant neoplasm of upper lobe, right bronchus or lung: Secondary | ICD-10-CM

## 2020-11-03 ENCOUNTER — Ambulatory Visit: Payer: Medicare HMO | Admitting: Radiation Oncology

## 2020-11-04 ENCOUNTER — Other Ambulatory Visit: Payer: Self-pay

## 2020-11-04 ENCOUNTER — Ambulatory Visit
Admission: RE | Admit: 2020-11-04 | Discharge: 2020-11-04 | Disposition: A | Discharge status: Home / Self Care | Payer: Medicare HMO | Source: Ambulatory Visit | Attending: Radiation Oncology | Admitting: Radiation Oncology

## 2020-11-04 DIAGNOSIS — Z510 Encounter for antineoplastic radiation therapy: Secondary | ICD-10-CM | POA: Diagnosis not present

## 2020-11-04 DIAGNOSIS — C3411 Malignant neoplasm of upper lobe, right bronchus or lung: Secondary | ICD-10-CM

## 2020-11-05 ENCOUNTER — Ambulatory Visit: Payer: Medicare HMO | Admitting: Radiation Oncology

## 2020-11-08 ENCOUNTER — Ambulatory Visit: Payer: Medicare HMO | Admitting: Radiation Oncology

## 2020-11-09 ENCOUNTER — Ambulatory Visit
Admission: RE | Admit: 2020-11-09 | Discharge: 2020-11-09 | Disposition: A | Discharge status: Home / Self Care | Payer: Medicare HMO | Source: Ambulatory Visit | Attending: Radiation Oncology | Admitting: Radiation Oncology

## 2020-11-09 ENCOUNTER — Other Ambulatory Visit: Payer: Self-pay | Admitting: Radiation Oncology

## 2020-11-09 ENCOUNTER — Encounter: Payer: Self-pay | Admitting: Radiation Oncology

## 2020-11-09 ENCOUNTER — Other Ambulatory Visit: Payer: Self-pay

## 2020-11-09 ENCOUNTER — Ambulatory Visit: Payer: Medicare HMO | Admitting: Radiation Oncology

## 2020-11-09 DIAGNOSIS — C3411 Malignant neoplasm of upper lobe, right bronchus or lung: Secondary | ICD-10-CM

## 2020-11-09 DIAGNOSIS — Z87891 Personal history of nicotine dependence: Secondary | ICD-10-CM | POA: Diagnosis not present

## 2020-11-09 DIAGNOSIS — Z510 Encounter for antineoplastic radiation therapy: Secondary | ICD-10-CM | POA: Diagnosis not present

## 2020-11-09 NOTE — Progress Notes (Signed)
      Radiation Oncology         [phone] ________________________________  Name: Luis Haney MRN: [id number]  Date: 11/09/2020  DOB: [birth year]  End of Treatment Note  Diagnosis:   Non-small cell lung cancer     Indication for treatment:  Curative       Radiation treatment dates:   Stage IA2, cT1bN0M0 NSCLC, squamous cell carcinoma of the RUL.  Site/dose:   The tumor in the RUL was treated with a course of stereotactic body radiation treatment. The patient received 54 Gy In 3 fractions at 18 G per fraction.  Narrative: The patient tolerated radiation treatment relatively well.  During treatment he did well without skin changes or concerns about fatigue. He did have one episode of nausea and vomiting this past weekend he felt was related to something he ate or a GI virus.   Plan: The patient has completed radiation treatment. The patient will return to radiation oncology clinic for routine followup in one month. I advised the patient to call or return sooner if they have any questions or concerns related to their recovery or treatment.   ------------------------------------------------    Carola Rhine, PAC

## 2020-11-10 ENCOUNTER — Ambulatory Visit: Payer: Medicare HMO | Admitting: Radiation Oncology

## 2020-11-11 ENCOUNTER — Ambulatory Visit: Payer: Medicare HMO | Admitting: Radiation Oncology

## 2020-11-12 ENCOUNTER — Ambulatory Visit: Payer: Medicare HMO | Admitting: Radiation Oncology

## 2020-11-16 ENCOUNTER — Ambulatory Visit: Payer: Medicare HMO | Admitting: Radiation Oncology

## 2020-11-19 ENCOUNTER — Telehealth: Payer: Self-pay | Admitting: *Deleted

## 2020-11-19 NOTE — Telephone Encounter (Signed)
Voicemail from Joaquin Courts without identifying information requesting this nurse by name.   Second call; " Joaquin Courts [phone]) D.O.B 23-Jul-1951.  Would like to talk with this nurse about insurance forms."   Message left for patient providing fax number [phone] to send FMLA or short/long- term disability forms.  Other forms are completed by provider's nurse.  Return call number [phone] provided asking to leave questions with message for nurse to obtain answers.

## 2020-11-22 NOTE — Telephone Encounter (Signed)
Connected with Joaquin Courts [phone]). "Ebony Hail advised me to contact you.  Dr. Kipp Brood completed a form for [facility].  No new form, they need an itemized bill for radiation treatments.  Received three treatments and see Ebony Hail again in six weeks."  "Will you notify radiation and ask them to call me?" Response received when this nurse advised to contact [facility] billing or radiation providing [facility] information to radiation.  Billing is not managed by this nurse.

## 2020-11-24 NOTE — Telephone Encounter (Addendum)
"  Luis Haney [phone]).  Did you talk to radiation about a bill.  [name] says all they have to do is send itemized bill.  What is her phone number?"  Confirmed sharing request via [employer] staff message in-basket mail.  Advised calling [facility] main number; asking to speak with Radiation Oncology Financial Specialist, Ailene Ravel for further assistance with billing needs.

## 2020-11-26 ENCOUNTER — Ambulatory Visit: Payer: Medicare HMO | Admitting: Thoracic Surgery (Cardiothoracic Vascular Surgery)

## 2020-12-01 ENCOUNTER — Other Ambulatory Visit: Payer: Self-pay | Admitting: Thoracic Surgery (Cardiothoracic Vascular Surgery)

## 2020-12-02 ENCOUNTER — Other Ambulatory Visit: Payer: Self-pay | Admitting: Thoracic Surgery (Cardiothoracic Vascular Surgery)

## 2020-12-02 DIAGNOSIS — R911 Solitary pulmonary nodule: Secondary | ICD-10-CM

## 2020-12-03 ENCOUNTER — Other Ambulatory Visit: Payer: Self-pay

## 2020-12-03 ENCOUNTER — Encounter: Payer: Self-pay | Admitting: Thoracic Surgery (Cardiothoracic Vascular Surgery)

## 2020-12-03 ENCOUNTER — Ambulatory Visit
Admission: RE | Admit: 2020-12-03 | Discharge: 2020-12-03 | Disposition: A | Discharge status: Home / Self Care | Payer: Medicare HMO | Source: Ambulatory Visit | Attending: Thoracic Surgery (Cardiothoracic Vascular Surgery) | Admitting: Thoracic Surgery (Cardiothoracic Vascular Surgery)

## 2020-12-03 ENCOUNTER — Ambulatory Visit (INDEPENDENT_AMBULATORY_CARE_PROVIDER_SITE_OTHER): Payer: Self-pay | Admitting: Thoracic Surgery (Cardiothoracic Vascular Surgery)

## 2020-12-03 VITALS — BP 155/80 | HR 67 | Resp 20 | Ht 73.0 in | Wt 214.0 lb

## 2020-12-03 DIAGNOSIS — C3411 Malignant neoplasm of upper lobe, right bronchus or lung: Secondary | ICD-10-CM | POA: Diagnosis not present

## 2020-12-03 DIAGNOSIS — R911 Solitary pulmonary nodule: Secondary | ICD-10-CM

## 2020-12-03 DIAGNOSIS — M47814 Spondylosis without myelopathy or radiculopathy, thoracic region: Secondary | ICD-10-CM | POA: Diagnosis not present

## 2020-12-03 DIAGNOSIS — Z8719 Personal history of other diseases of the digestive system: Secondary | ICD-10-CM | POA: Diagnosis not present

## 2020-12-03 DIAGNOSIS — Z09 Encounter for follow-up examination after completed treatment for conditions other than malignant neoplasm: Secondary | ICD-10-CM

## 2020-12-03 DIAGNOSIS — J929 Pleural plaque without asbestos: Secondary | ICD-10-CM | POA: Diagnosis not present

## 2020-12-03 NOTE — Progress Notes (Signed)
      [address]       ,[place] [address]             [phone]        Jlyn R Triplett Harvey Medical Record #[id number] Date of Birth: 09/08/1950  Referring: Garner Nash, DO Primary Care: Care, [place] [facility] Primary Cardiologist:None  Reason for visit:   follow-up  History of Present Illness:     Mr. Luis Haney comes in for his 1 month follow-up appointment.  He has no major complaints.  He has started his radiation therapy.  Physical Exam: BP (!) 155/80   Pulse 67   Resp 20   Ht 6\' 1"  (1.854 m)   Wt 214 lb (97.1 kg)   SpO2 96% Comment: RA  BMI 28.23 kg/m   Alert NAD Incision clean.  Abdomen soft, ND No peripheral edema   Diagnostic Studies & Laboratory data: CXR: Stable Path: CT-guided biopsy with squamous cell cancer.    Assessment / Plan:   70 year old male status post aborted robotic assisted thoracoscopy due to significant adhesions.  The patient did not want to undergo thoracotomy thus he was referred to radiation oncology.  CT-guided biopsy did show squamous cell cancer.  He is currently undergoing SBRT.  I referred him to medical oncology for further surveillance.   [name] 12/03/2020 4:24 PM

## 2020-12-06 ENCOUNTER — Telehealth: Payer: Self-pay | Admitting: *Deleted

## 2020-12-06 DIAGNOSIS — C3411 Malignant neoplasm of upper lobe, right bronchus or lung: Secondary | ICD-10-CM

## 2020-12-06 NOTE — Telephone Encounter (Signed)
Mr. Coia call me today. I updated him on an appt for him to see Dr. Julien Nordmann. He verbalized understanding of appt time and place.

## 2020-12-06 NOTE — Telephone Encounter (Signed)
I received referral on Mr. Luis Haney.  I called to schedule but was unable to reach. I did leave vm message with my name and phone number to call.

## 2020-12-16 ENCOUNTER — Ambulatory Visit: Payer: Self-pay | Admitting: Radiation Oncology

## 2020-12-16 ENCOUNTER — Encounter: Payer: Self-pay | Admitting: *Deleted

## 2020-12-16 DIAGNOSIS — C3411 Malignant neoplasm of upper lobe, right bronchus or lung: Secondary | ICD-10-CM

## 2020-12-16 NOTE — Progress Notes (Signed)
Per Dr. Julien Nordmann he would like CMP instead of BMP. Completed

## 2020-12-21 ENCOUNTER — Inpatient Hospital Stay: Payer: Medicare HMO

## 2020-12-21 ENCOUNTER — Other Ambulatory Visit: Payer: Self-pay

## 2020-12-21 ENCOUNTER — Inpatient Hospital Stay: Payer: Medicare HMO | Attending: Internal Medicine | Admitting: Internal Medicine

## 2020-12-21 ENCOUNTER — Encounter: Payer: Self-pay | Admitting: *Deleted

## 2020-12-21 ENCOUNTER — Encounter: Payer: Self-pay | Admitting: Internal Medicine

## 2020-12-21 VITALS — BP 143/76 | HR 57 | Temp 97.4°F | Resp 18 | Ht 73.0 in | Wt 214.7 lb

## 2020-12-21 DIAGNOSIS — C3411 Malignant neoplasm of upper lobe, right bronchus or lung: Secondary | ICD-10-CM | POA: Insufficient documentation

## 2020-12-21 DIAGNOSIS — J449 Chronic obstructive pulmonary disease, unspecified: Secondary | ICD-10-CM | POA: Diagnosis not present

## 2020-12-21 DIAGNOSIS — Z923 Personal history of irradiation: Secondary | ICD-10-CM | POA: Diagnosis not present

## 2020-12-21 DIAGNOSIS — J90 Pleural effusion, not elsewhere classified: Secondary | ICD-10-CM

## 2020-12-21 DIAGNOSIS — Z87891 Personal history of nicotine dependence: Secondary | ICD-10-CM | POA: Insufficient documentation

## 2020-12-21 DIAGNOSIS — C3490 Malignant neoplasm of unspecified part of unspecified bronchus or lung: Secondary | ICD-10-CM

## 2020-12-21 LAB — CBC WITH DIFFERENTIAL (CANCER CENTER ONLY)
Abs Immature Granulocytes: 0.02 10*3/uL (ref 0.00–0.07)
Basophils Absolute: 0.1 10*3/uL (ref 0.0–0.1)
Basophils Relative: 1 %
Eosinophils Absolute: 0.1 10*3/uL (ref 0.0–0.5)
Eosinophils Relative: 2 %
HCT: 38 % — ABNORMAL LOW (ref 39.0–52.0)
Hemoglobin: 12.3 g/dL — ABNORMAL LOW (ref 13.0–17.0)
Immature Granulocytes: 0 %
Lymphocytes Relative: 23 %
Lymphs Abs: 1.3 10*3/uL (ref 0.7–4.0)
MCH: 31.1 pg (ref 26.0–34.0)
MCHC: 32.4 g/dL (ref 30.0–36.0)
MCV: 96.2 fL (ref 80.0–100.0)
Monocytes Absolute: 0.5 10*3/uL (ref 0.1–1.0)
Monocytes Relative: 9 %
Neutro Abs: 3.4 10*3/uL (ref 1.7–7.7)
Neutrophils Relative %: 65 %
Platelet Count: 211 10*3/uL (ref 150–400)
RBC: 3.95 MIL/uL — ABNORMAL LOW (ref 4.22–5.81)
RDW: 13.5 % (ref 11.5–15.5)
WBC Count: 5.4 10*3/uL (ref 4.0–10.5)
nRBC: 0 % (ref 0.0–0.2)

## 2020-12-21 LAB — CMP (CANCER CENTER ONLY)
ALT: 13 U/L (ref 0–44)
AST: 21 U/L (ref 15–41)
Albumin: 4 g/dL (ref 3.5–5.0)
Alkaline Phosphatase: 58 U/L (ref 38–126)
Anion gap: 12 (ref 5–15)
BUN: 19 mg/dL (ref 8–23)
CO2: 26 mmol/L (ref 22–32)
Calcium: 9.6 mg/dL (ref 8.9–10.3)
Chloride: 102 mmol/L (ref 98–111)
Creatinine: 1.24 mg/dL (ref 0.61–1.24)
GFR, Estimated: >60 mL/min (ref >60)
Glucose, Bld: 102 mg/dL — ABNORMAL HIGH (ref 70–99)
Potassium: 4 mmol/L (ref 3.5–5.1)
Sodium: 140 mmol/L (ref 135–145)
Total Bilirubin: 0.6 mg/dL (ref 0.3–1.2)
Total Protein: 7.2 g/dL (ref 6.5–8.1)

## 2020-12-21 NOTE — Progress Notes (Signed)
  Oncology Nurse Navigator Documentation  Oncology Nurse Navigator Flowsheets 12/21/2020  Abnormal Finding Date 07/04/2020  Confirmed Diagnosis Date 10/08/2020  Diagnosis Status Confirmed Diagnosis Complete  Planned Course of Treatment Radiation  Phase of Treatment Radiation  Radiation Actual Start Date: 10/25/2020  Radiation Actual End Date: 11/09/2020  Navigator Follow Up Date: 02/21/2021  Navigator Follow Up Reason: Appointment Review  Navigator Location [facility]  Referral Date to RadOnc/MedOnc 12/06/2020  Navigator Encounter Type Clinic/MDC/spoke to Mr. Gunther today at clinic. He is s/p XRT for lung cancer.  He was seen with medical oncology today and plan of care is a follow up scan in 6 months and to see Dr. Julien Nordmann after that. Patient verbalized understanding of care plan.   Treatment Initiated Date 10/25/2020  Patient Visit Type Initial;MedOnc  Treatment Phase Pre-Tx/Tx Discussion  Barriers/Navigation Needs Education  Education Other  Acuity Level 1-No Barriers  Time Spent with Patient 27

## 2020-12-21 NOTE — Progress Notes (Signed)
[place] [phone]   [phone]  CONSULT NOTE  REFERRING PHYSICIAN: Dr. Melodie Bouillon  REASON FOR CONSULTATION:  70 years old white male recently diagnosed with lung cancer.  HPI Luis Haney is a 70 y.o. male with past medical history significant for COPD, pneumonia, pleural effusion and long history of smoking.  The patient was admitted to the hospital in December 5956 for complicated right pleural effusion/empyema.  CT scan of the chest performed on June 28, 2020 showed large loculated right pleural effusion with compressive atelectasis versus infiltrate/pneumonia in the right lung particularly right lower lobe.  There was also a spiculated 1.0 cm right upper lobe nodule.  After improvement of his empyema, the patient had a PET scan on August 26, 2020 and it showed hypermetabolic nodule at the right lung apex most consistent with primary bronchogenic carcinoma.  There was no evidence of metastatic disease to the mediastinal lymph node or distant metastasis.  On October 07, 2020 the patient underwent bronchoscopy under the care of Dr. Lamonte Sakai and the final pathology from the right upper lobe brushing and fine-needle aspiration ([id number]) showed malignant cells consistent with non-small cell carcinoma.  On October 08, 2020 the patient underwent right upper lobe biopsy under the care of Dr. 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 Immunohistochemical stains show that the tumor cells are positive for  p63 and CK5/6; while they are negative for Napsin-A and TTF-1. This immunoprofile is consistent with above interpretation.  The patient was not a good surgical candidate and he was referred to radiation oncology for curative SBRT under the care of Dr. Lisbeth Renshaw completed on November 09, 2020. The patient was referred to me today for evaluation and recommendation regarding surveillance of his disease. When seen today he  is feeling fine with no concerning complaints except for fatigue.  He gained several pounds recently.  He denied having any chest pain, shortness of breath, cough or hemoptysis.  He denied having any fever or chills.  He has no nausea, vomiting, diarrhea or constipation.  He has no headache or visual changes. Family history significant for mother with heart disease.  Father died from old age. The patient is married and has 2 stepchildren and 14 grandchildren. 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  He has a history for smoking 2 pack/day for around 18 years and quit December 2021.  He drinks alcohol occasionally and no history of for drug abuse. HPI  Past Medical History:  Diagnosis Date  . COPD (chronic obstructive pulmonary disease) ([place])   . Dyspnea    occasuional with exertion  . Lung cancer ([place]) 09/2020  . Pleural effusion 06/2020  . Pneumonia   . Pulmonary nodule 09/2020    Past Surgical History:  Procedure Laterality Date  . BACK SURGERY  2000  . BRONCHIAL BIOPSY  10/07/2020   Procedure: BRONCHIAL BIOPSIES;  Surgeon: Collene Gobble, MD;  Location: [facility] ENDOSCOPY;  Service: Pulmonary;;  . BRONCHIAL BRUSHINGS  10/07/2020   Procedure: BRONCHIAL BRUSHINGS;  Surgeon: Collene Gobble, MD;  Location: [facility] ENDOSCOPY;  Service: Pulmonary;;  . BRONCHIAL NEEDLE ASPIRATION BIOPSY  10/07/2020   Procedure: BRONCHIAL NEEDLE ASPIRATION BIOPSIES;  Surgeon: Collene Gobble, MD;  Location: [facility] ENDOSCOPY;  Service: Pulmonary;;  . CHEST TUBE INSERTION Right 06/2020  . CHEST TUBE INSERTION Right 10/07/2020  . NECK SURGERY  2012  . SUBMUCOSAL INJECTION  10/07/2020  Procedure: FIDUCIAL DYE MARKING;  Surgeon: Collene Gobble, MD;  Location: [facility] ENDOSCOPY;  Service: Pulmonary;;  . TONSILLECTOMY     removed as a chld  . VIDEO BRONCHOSCOPY WITH ENDOBRONCHIAL NAVIGATION N/A 10/07/2020   Procedure: VIDEO BRONCHOSCOPY WITH ENDOBRONCHIAL NAVIGATION;  Surgeon: Collene Gobble, MD;  Location: [facility] ENDOSCOPY;  Service: Pulmonary;  Laterality: N/A;    Family History  Problem Relation Age of Onset  . Heart disease Mother   . Other Father        "old age"    Social History Social History   Tobacco Use  . Smoking status: Former Smoker    Packs/day: 2.00    Years: 30.00    Pack years: 60.00    Types: Cigarettes    Quit date: 06/2020    Years since quitting: 0.4  . Smokeless tobacco: Never Used  Vaping Use  . Vaping Use: Never used  Substance Use Topics  . Alcohol use: Yes    Comment: beer occasionally  . Drug use: Not Currently    No Known Allergies  Current Outpatient Medications  Medication Sig Dispense Refill  . albuterol (VENTOLIN HFA) 108 (90 Base) MCG/ACT inhaler Inhale 2 puffs into the lungs every 6 (six) hours as needed for wheezing or shortness of breath. 8 g 6  . calcium carbonate (OS-CAL - DOSED IN MG OF ELEMENTAL CALCIUM) 1250 (500 Ca) MG tablet Take 1 tablet by mouth 3 (three) times daily with meals.    . Fluticasone-Umeclidin-Vilant (TRELEGY ELLIPTA) 100-62.5-25 MCG/INH AEPB Inhale 1 puff into the lungs daily. 60 each 6  . Multiple Vitamin (MULTI VITAMIN MENS PO) Take 1 tablet by mouth daily.    . Potassium 99 MG TABS Take 99 mg by mouth daily.    . traMADol (ULTRAM) 50 MG tablet Take by mouth every 6 (six) hours as needed.     No current facility-administered medications for this visit.   Facility-Administered Medications Ordered in Other Visits  Medication Dose Route Frequency Provider Last Rate Last Admin  . albuterol (PROVENTIL) (2.5 MG/3ML) 0.083% nebulizer solution 2.5 mg  2.5 mg Nebulization Once Martyn Ehrich, NP        Review of Systems  Constitutional: positive for fatigue Eyes: negative Ears, nose, mouth, throat, and face: negative Respiratory: positive for cough Cardiovascular: negative Gastrointestinal: negative Genitourinary:negative Integument/breast: negative Hematologic/lymphatic:  negative Musculoskeletal:negative Neurological: negative Behavioral/Psych: negative Endocrine: negative Allergic/Immunologic: negative  Physical Exam  MPN:TIRWE, healthy, no distress, well nourished and well developed SKIN: skin color, texture, turgor are normal, no rashes or significant lesions HEAD: Normocephalic, No masses, lesions, tenderness or abnormalities EYES: normal, PERRLA, Conjunctiva are pink and non-injected EARS: External ears normal, Canals clear OROPHARYNX:no exudate, no erythema and lips, buccal mucosa, and tongue normal  NECK: supple, no adenopathy, no JVD LYMPH:  no palpable lymphadenopathy, no hepatosplenomegaly LUNGS: clear to auscultation , and palpation HEART: regular rate & rhythm, no murmurs and no gallops ABDOMEN:abdomen soft, non-tender, normal bowel sounds and no masses or organomegaly BACK: No CVA tenderness, Range of motion is normal EXTREMITIES:no joint deformities, effusion, or inflammation, no edema  NEURO: alert & oriented x 3 with fluent speech, no focal motor/sensory deficits  PERFORMANCE STATUS: ECOG 1  LABORATORY DATA: Lab Results  Component Value Date   WBC 5.4 12/21/2020   HGB 12.3 (L) 12/21/2020   HCT 38.0 (L) 12/21/2020   MCV 96.2 12/21/2020   PLT 211 12/21/2020      Chemistry      Component Value  Date/Time   NA 140 12/21/2020 1337   K 4.0 12/21/2020 1337   CL 102 12/21/2020 1337   CO2 26 12/21/2020 1337   BUN 19 12/21/2020 1337   CREATININE 1.24 12/21/2020 1337      Component Value Date/Time   CALCIUM 9.6 12/21/2020 1337   ALKPHOS 58 12/21/2020 1337   AST 21 12/21/2020 1337   ALT 13 12/21/2020 1337   BILITOT 0.6 12/21/2020 1337       RADIOGRAPHIC STUDIES: DG Chest 2 View  Result Date: 12/03/2020 CLINICAL DATA:  70 year old with a biopsy-proven squamous cell carcinoma involving the RIGHT UPPER LOBE. For which the patient is undergoing radiation therapy. 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 EXAM: CHEST - 2 VIEW COMPARISON:  10/09/2020 and earlier, including CT biopsy images 318 2022 and CT chest 09/30/2020. FINDINGS: Cardiac silhouette normal in size, unchanged. Thoracic aorta tortuous and mildly atherosclerotic, unchanged. Hilar and mediastinal contours otherwise unremarkable. The medial RIGHT UPPER LOBE lung mass identified on the prior CT is inconspicuous on the x-ray. Possible small RIGHT pleural effusion superimposed upon pleural scarring at the RIGHT base, unchanged. Pleural thickening in the RIGHT minor fissure, unchanged. Lungs appear clear otherwise. Pulmonary vascularity normal. Bronchovascular markings normal. No LEFT pleural effusion. Mild degenerative changes involving the thoracic spine. IMPRESSION: 1. No new/acute cardiopulmonary disease. 2. Stable chronic RIGHT pleural scarring with a possible associated small RIGHT pleural effusion. 3. Stable pleural thickening in the RIGHT minor fissure. 4. The medial RIGHT UPPER LOBE lung mass, biopsy-proven squamous cell carcinoma, is inconspicuous on the x-ray due to its far medial location. Electronically Signed   By: Evangeline Dakin M.D.   On: 12/03/2020 13:05    ASSESSMENT: This is a very pleasant 70 years old white male recently diagnosed with stage IA (T1b, N0, M0) non-small cell lung cancer, squamous cell carcinoma presented with right upper lobe lung nodule status post curative SBRT under the care of Dr. Lisbeth Renshaw completed on November 09, 2020.   PLAN: I had a lengthy discussion with the patient today about his current condition and treatment options. The patient did not wish to proceed with surgical resection because of concern about open surgery secondary to the scarring from his previous pneumonia.  He underwent curative radiotherapy with SBRT and tolerated the procedure well. I explained to the patient that there is no role for adjuvant systemic chemotherapy in his condition and the  current standard of care is observation. I recommended for the patient to have repeat CT scan of the chest in 6 months for restaging of his disease. I also strongly encouraged the patient to continue with the smoking cessation. He was advised to call immediately if he has any concerning symptoms in the interval. The patient voices understanding of current disease status and treatment options and is in agreement with the current care plan. All questions were answered. The patient knows to call the clinic with any problems, questions or concerns. We can certainly see the patient much sooner if necessary.  Thank you so much for allowing me to participate in the care of Luis Haney. I will continue to follow up the patient with you and assist in his care.  The total time spent in the appointment was 60 minutes.  Disclaimer: This note was dictated with voice recognition software. Similar sounding words can inadvertently be transcribed and may not be corrected upon review.   Eilleen Kempf Dec 21, 2020, 2:29  PM

## 2020-12-29 ENCOUNTER — Ambulatory Visit: Payer: Self-pay | Admitting: Radiation Oncology

## 2021-03-13 DIAGNOSIS — M179 Osteoarthritis of knee, unspecified: Secondary | ICD-10-CM | POA: Diagnosis not present

## 2021-03-13 DIAGNOSIS — M25462 Effusion, left knee: Secondary | ICD-10-CM | POA: Diagnosis not present

## 2021-03-25 DIAGNOSIS — M1712 Unilateral primary osteoarthritis, left knee: Secondary | ICD-10-CM | POA: Diagnosis not present

## 2021-03-25 DIAGNOSIS — S86912A Strain of unspecified muscle(s) and tendon(s) at lower leg level, left leg, initial encounter: Secondary | ICD-10-CM | POA: Diagnosis not present

## 2021-04-19 DIAGNOSIS — M1712 Unilateral primary osteoarthritis, left knee: Secondary | ICD-10-CM | POA: Diagnosis not present

## 2021-05-04 ENCOUNTER — Telehealth: Payer: Self-pay | Admitting: Pulmonary Disease

## 2021-05-04 NOTE — Telephone Encounter (Signed)
Call made to patient, confirmed DOB. Patient states his trelegy has gone up to $153/month. I told him he would need to contact his insurance and find out if there is anything that is affordable to him. [place] patient assistance application mailed to him. No samples of trelegy at this time. Patient is out of medication.   BI please advise if patient can try something else. We have breztri samples but only 4 left. Also Anoro, Breztri, spiriva and stiolto.

## 2021-05-05 MED ORDER — TRELEGY ELLIPTA 100-62.5-25 MCG/INH IN AEPB: 1.0000 | INHALATION_SPRAY | Freq: Every day | RESPIRATORY_TRACT | 0 refills | Status: DC

## 2021-05-05 NOTE — Telephone Encounter (Signed)
Per BI-Okay to give Breztri samples if we have them.  Otherwise you can give him samples of Trelegy when we have some come in stock.  Go ahead and get the patient a [place] financial aid application.     [facility] Application already mailed  Sample will be provided for the pt  Nothing further needed

## 2021-05-10 ENCOUNTER — Encounter: Payer: Self-pay | Admitting: *Deleted

## 2021-05-10 NOTE — Telephone Encounter (Signed)
I have called the pt and he stated that he has the form completed and he will try to fax this to our office but if not he will mail it.  Nothing further is needed.

## 2021-05-19 NOTE — Telephone Encounter (Signed)
Can someone see if we have any samples of the Trelegy for the pt.  He is out of his medication and if someone can call about his medication from pt assistance.  thanks

## 2021-05-20 MED ORDER — TRELEGY ELLIPTA 100-62.5-25 MCG/ACT IN AEPB: 1.0000 | INHALATION_SPRAY | Freq: Every day | RESPIRATORY_TRACT | 3 refills | Status: DC

## 2021-05-20 NOTE — Addendum Note (Signed)
Addended by: Lorretta Harp on: 05/20/2021 02:21 PM   Modules accepted: Orders

## 2021-05-20 NOTE — Telephone Encounter (Signed)
Pt assistance paperwork from [place] for pt's trelegy has been received. Attempted to call pt to let him know this had been received but unable to reach and while trying to leave pt a VM, received a recording that stated message was cancelled. Sent pt a mychart message to an open encounter that we had letting him know that the paperwork was received.

## 2021-05-24 ENCOUNTER — Telehealth: Payer: Self-pay | Admitting: Pulmonary Disease

## 2021-05-24 NOTE — Telephone Encounter (Signed)
Call returned to patient, confirmed DOB. Made aware I would go ahead and place samples upfront for him to pick. Patient states he lives in [place] and will not be able to come get the samples and hung up in my face.   I attempted to call patient back and he again hung up in my face again. Will send patient a email and make MD aware as well.

## 2021-05-24 NOTE — Telephone Encounter (Signed)
Please see mychart message. Will close this encounter

## 2021-05-25 NOTE — Telephone Encounter (Signed)
Call made to [place] to check status of Patient Assistance Application. Spoke with Hilda Blades they are missing  copy of insurance card and they also need a print out from pharmacy showing the total  he has spent out of pocket showing a minimum of $600.

## 2021-05-26 ENCOUNTER — Encounter: Payer: Self-pay | Admitting: Internal Medicine

## 2021-06-02 NOTE — Telephone Encounter (Signed)
LMTCB with patient.   Call made to [place]. They have attempted to contact patient several times since we last spoke with them. They just need financial proof of the $600 spent out of pocket.

## 2021-06-03 ENCOUNTER — Telehealth: Payer: Self-pay | Admitting: Pulmonary Disease

## 2021-06-03 MED ORDER — TRELEGY ELLIPTA 100-62.5-25 MCG/ACT IN AEPB: 1.0000 | INHALATION_SPRAY | Freq: Every day | RESPIRATORY_TRACT | 0 refills | Status: AC

## 2021-06-03 NOTE — Telephone Encounter (Signed)
Call made to patient, confirmed DOB. Made aware sample is ready for pick up. Voiced understanding.

## 2021-06-15 MED ORDER — TRELEGY ELLIPTA 200-62.5-25 MCG/ACT IN AEPB: 1.0000 | INHALATION_SPRAY | Freq: Every day | RESPIRATORY_TRACT | Status: DC

## 2021-06-15 NOTE — Telephone Encounter (Signed)
[name] do yall have samples(Trelegy)  in the office that we can give Luis Haney.  He lives in [place] and could get them easier from that office.  thanks

## 2021-06-15 NOTE — Telephone Encounter (Signed)
[name] the 200 for the Trelegy is ok to give.   thanks

## 2021-06-15 NOTE — Telephone Encounter (Signed)
We do not have samples of trelegy 100, however we do have trelegy 200.

## 2021-06-15 NOTE — Telephone Encounter (Signed)
BI please advise if ok to give samples of Trelegy 200 for pt to use so he does not go without.  thanks

## 2021-06-20 ENCOUNTER — Ambulatory Visit (HOSPITAL_COMMUNITY)
Admission: RE | Admit: 2021-06-20 | Discharge: 2021-06-20 | Disposition: A | Discharge status: Home / Self Care | Payer: Medicare HMO | Source: Ambulatory Visit | Attending: Internal Medicine | Admitting: Internal Medicine

## 2021-06-20 ENCOUNTER — Other Ambulatory Visit: Payer: Self-pay

## 2021-06-20 ENCOUNTER — Inpatient Hospital Stay: Payer: Medicare HMO | Attending: Internal Medicine

## 2021-06-20 ENCOUNTER — Encounter (HOSPITAL_COMMUNITY): Payer: Self-pay

## 2021-06-20 ENCOUNTER — Encounter: Payer: Self-pay | Admitting: Internal Medicine

## 2021-06-20 DIAGNOSIS — Z87891 Personal history of nicotine dependence: Secondary | ICD-10-CM | POA: Insufficient documentation

## 2021-06-20 DIAGNOSIS — Z85118 Personal history of other malignant neoplasm of bronchus and lung: Secondary | ICD-10-CM | POA: Insufficient documentation

## 2021-06-20 DIAGNOSIS — R911 Solitary pulmonary nodule: Secondary | ICD-10-CM | POA: Diagnosis not present

## 2021-06-20 DIAGNOSIS — C3490 Malignant neoplasm of unspecified part of unspecified bronchus or lung: Secondary | ICD-10-CM

## 2021-06-20 DIAGNOSIS — J449 Chronic obstructive pulmonary disease, unspecified: Secondary | ICD-10-CM | POA: Insufficient documentation

## 2021-06-20 DIAGNOSIS — R059 Cough, unspecified: Secondary | ICD-10-CM | POA: Insufficient documentation

## 2021-06-20 DIAGNOSIS — I2510 Atherosclerotic heart disease of native coronary artery without angina pectoris: Secondary | ICD-10-CM | POA: Diagnosis not present

## 2021-06-20 DIAGNOSIS — I700 Atherosclerosis of aorta: Secondary | ICD-10-CM | POA: Diagnosis not present

## 2021-06-20 LAB — CBC WITH DIFFERENTIAL (CANCER CENTER ONLY)
Abs Immature Granulocytes: 0.01 10*3/uL (ref 0.00–0.07)
Basophils Absolute: 0.1 10*3/uL (ref 0.0–0.1)
Basophils Relative: 1 %
Eosinophils Absolute: 0.4 10*3/uL (ref 0.0–0.5)
Eosinophils Relative: 6 %
HCT: 36 % — ABNORMAL LOW (ref 39.0–52.0)
Hemoglobin: 11.6 g/dL — ABNORMAL LOW (ref 13.0–17.0)
Immature Granulocytes: 0 %
Lymphocytes Relative: 24 %
Lymphs Abs: 1.6 10*3/uL (ref 0.7–4.0)
MCH: 31.8 pg (ref 26.0–34.0)
MCHC: 32.2 g/dL (ref 30.0–36.0)
MCV: 98.6 fL (ref 80.0–100.0)
Monocytes Absolute: 0.6 10*3/uL (ref 0.1–1.0)
Monocytes Relative: 8 %
Neutro Abs: 4.3 10*3/uL (ref 1.7–7.7)
Neutrophils Relative %: 61 %
Platelet Count: 249 10*3/uL (ref 150–400)
RBC: 3.65 MIL/uL — ABNORMAL LOW (ref 4.22–5.81)
RDW: 13.2 % (ref 11.5–15.5)
WBC Count: 6.9 10*3/uL (ref 4.0–10.5)
nRBC: 0 % (ref 0.0–0.2)

## 2021-06-20 LAB — CMP (CANCER CENTER ONLY)
ALT: 10 U/L (ref 0–44)
AST: 15 U/L (ref 15–41)
Albumin: 4 g/dL (ref 3.5–5.0)
Alkaline Phosphatase: 58 U/L (ref 38–126)
Anion gap: 9 (ref 5–15)
BUN: 22 mg/dL (ref 8–23)
CO2: 26 mmol/L (ref 22–32)
Calcium: 9.7 mg/dL (ref 8.9–10.3)
Chloride: 104 mmol/L (ref 98–111)
Creatinine: 1.43 mg/dL — ABNORMAL HIGH (ref 0.61–1.24)
GFR, Estimated: 52 mL/min — ABNORMAL LOW (ref >60)
Glucose, Bld: 97 mg/dL (ref 70–99)
Potassium: 4.3 mmol/L (ref 3.5–5.1)
Sodium: 139 mmol/L (ref 135–145)
Total Bilirubin: 0.5 mg/dL (ref 0.3–1.2)
Total Protein: 6.9 g/dL (ref 6.5–8.1)

## 2021-06-20 MED ORDER — IOHEXOL 350 MG/ML SOLN
60.0000 mL | Freq: Once | INTRAVENOUS | Status: AC | PRN
  Administered 2021-06-20: 15:00:00 60 mL via INTRAVENOUS

## 2021-06-20 NOTE — Progress Notes (Signed)
[facility] OFFICE PROGRESS NOTE  Care, [name] [address] [place] [address]  DIAGNOSIS: Stage IA (T1b, N0, M0) non-small cell lung cancer, squamous cell carcinoma presented with right upper lobe lung nodule.  Diagnosed in March 2022  PRIOR THERAPY: SBRT under the care of Dr. Lisbeth Renshaw completed on November 09, 2020.  CURRENT THERAPY: Observation  INTERVAL HISTORY: Luis Haney 70 y.o. male returns to the clinic today for a 67-month follow-up visit accompanied by his wife.  He was diagnosed with a stage Ia non-small cell lung cancer, squamous cell carcinoma in March 2022.  The patient completed SBRT under the care of Dr. Lisbeth Renshaw in April 2022.  The patient follows with medical oncology for surveillance. Overall, the patient is feeling fairly well today without any concerning complaints except for chronic baseline dyspnea with exertion and chronic cough. He sees Dr. Valeta Harms for his COPD. Denies any recent changes with his breathing. He quit smoking when he was previously hospitalized with pneumonia.  He denies any fever, chills, night sweats, or unexplained weight loss.  Denies any chest pain or hemoptysis.  Denies any nausea, vomiting, diarrhea, or constipation.  Denies any headache or visual changes.  The patient recently had a restaging CT scan performed.  He is here today for evaluation and to review his scan results.   MEDICAL HISTORY: Past Medical History:  Diagnosis Date   COPD (chronic obstructive pulmonary disease) ([place])    Dyspnea    occasuional with exertion   Lung cancer ([place]) 09/2020   Pleural effusion 06/2020   Pneumonia    Pulmonary nodule 09/2020    ALLERGIES:  has No Known Allergies.  MEDICATIONS:  Current Outpatient Medications  Medication Sig Dispense Refill   albuterol (VENTOLIN HFA) 108 (90 Base) MCG/ACT inhaler Inhale 2 puffs into the lungs every 6 (six) hours as needed for wheezing or shortness of breath. 8 g 6   calcium carbonate (OS-CAL -  DOSED IN MG OF ELEMENTAL CALCIUM) 1250 (500 Ca) MG tablet Take 1 tablet by mouth 3 (three) times daily with meals.     Fluticasone-Umeclidin-Vilant (TRELEGY ELLIPTA) 100-62.5-25 MCG/ACT AEPB Inhale 1 puff into the lungs daily. 180 each 3   Fluticasone-Umeclidin-Vilant (TRELEGY ELLIPTA) 100-62.5-25 MCG/ACT AEPB Inhale 1 puff into the lungs daily. 1 each 0   Fluticasone-Umeclidin-Vilant (TRELEGY ELLIPTA) 100-62.5-25 MCG/INH AEPB Inhale 1 puff into the lungs daily. 14 each 0   Fluticasone-Umeclidin-Vilant (TRELEGY ELLIPTA) 200-62.5-25 MCG/ACT AEPB Inhale 1 puff into the lungs daily. 28 each .   Multiple Vitamin (MULTI VITAMIN MENS PO) Take 1 tablet by mouth daily.     Potassium 99 MG TABS Take 99 mg by mouth daily.     No current facility-administered medications for this visit.   Facility-Administered Medications Ordered in Other Visits  Medication Dose Route Frequency Provider Last Rate Last Admin   albuterol (PROVENTIL) (2.5 MG/3ML) 0.083% nebulizer solution 2.5 mg  2.5 mg Nebulization Once Martyn Ehrich, NP        SURGICAL HISTORY:  Past Surgical History:  Procedure Laterality Date   BACK SURGERY  2000   BRONCHIAL BIOPSY  10/07/2020   Procedure: BRONCHIAL BIOPSIES;  Surgeon: Collene Gobble, MD;  Location: [facility] ENDOSCOPY;  Service: Pulmonary;;   BRONCHIAL BRUSHINGS  10/07/2020   Procedure: BRONCHIAL BRUSHINGS;  Surgeon: Collene Gobble, MD;  Location: [facility] ENDOSCOPY;  Service: Pulmonary;;   BRONCHIAL NEEDLE ASPIRATION BIOPSY  10/07/2020   Procedure: BRONCHIAL NEEDLE ASPIRATION BIOPSIES;  Surgeon: Collene Gobble,  MD;  Location: [place];  Service: Pulmonary;;   CHEST TUBE INSERTION Right 06/2020   CHEST TUBE INSERTION Right 10/07/2020   NECK SURGERY  2012   SUBMUCOSAL INJECTION  10/07/2020   Procedure: FIDUCIAL DYE MARKING;  Surgeon: Collene Gobble, MD;  Location: [facility] ENDOSCOPY;  Service: Pulmonary;;   TONSILLECTOMY     removed as a chld   VIDEO BRONCHOSCOPY WITH ENDOBRONCHIAL  NAVIGATION N/A 10/07/2020   Procedure: VIDEO BRONCHOSCOPY WITH ENDOBRONCHIAL NAVIGATION;  Surgeon: Collene Gobble, MD;  Location: [place] ENDOSCOPY;  Service: Pulmonary;  Laterality: N/A;    REVIEW OF SYSTEMS:   Review of Systems  Constitutional: Negative for appetite change, chills, fatigue, fever and unexpected weight change.  HENT:   Negative for mouth sores, nosebleeds, sore throat and trouble swallowing.   Eyes: Negative for eye problems and icterus.  Respiratory: Positive for baseline dyspnea on exertion and cough. Negative for  hemoptysis and wheezing.   Cardiovascular: Negative for chest pain and leg swelling.  Gastrointestinal: Negative for abdominal pain, constipation, diarrhea, nausea and vomiting.  Genitourinary: Negative for bladder incontinence, difficulty urinating, dysuria, frequency and hematuria.   Musculoskeletal: Negative for back pain, gait problem, neck pain and neck stiffness.  Skin: Negative for itching and rash.  Neurological: Negative for dizziness, extremity weakness, gait problem, headaches, light-headedness and seizures.  Hematological: Negative for adenopathy. Does not bruise/bleed easily.  Psychiatric/Behavioral: Negative for confusion, depression and sleep disturbance. The patient is not nervous/anxious.     PHYSICAL EXAMINATION:  Blood pressure (!) 147/81, pulse (!) 58, temperature 97.6 F (36.4 C), temperature source Tympanic, resp. rate 18, height 6\' 1"  (1.854 m), weight 224 lb (101.6 kg), SpO2 98 %.  ECOG PERFORMANCE STATUS: 1  Physical Exam  Constitutional: Oriented to person, place, and time and well-developed, well-nourished, and in no distress.  HENT:  Head: Normocephalic and atraumatic.  Mouth/Throat: Oropharynx is clear and moist. No oropharyngeal exudate.  Eyes: Conjunctivae are normal. Right eye exhibits no discharge. Left eye exhibits no discharge. No scleral icterus.  Neck: Normal range of motion. Neck supple.  Cardiovascular: Normal rate,  regular rhythm, normal heart sounds and intact distal pulses.   Pulmonary/Chest: Effort normal. Quiet breath sounds bilaterally. No respiratory distress. No wheezes. No rales.  Abdominal: Soft. Bowel sounds are normal. Exhibits no distension and no mass. There is no tenderness.  Musculoskeletal: Normal range of motion. Exhibits no edema.  Lymphadenopathy:    No cervical adenopathy.  Neurological: Alert and oriented to person, place, and time. Exhibits normal muscle tone. Gait normal. Coordination normal.  Skin: Skin is warm and dry. No rash noted. Not diaphoretic. No erythema. No pallor.  Psychiatric: Mood, memory and judgment normal.  Vitals reviewed.  LABORATORY DATA: Lab Results  Component Value Date   WBC 6.9 06/20/2021   HGB 11.6 (L) 06/20/2021   HCT 36.0 (L) 06/20/2021   MCV 98.6 06/20/2021   PLT 249 06/20/2021      Chemistry      Component Value Date/Time   NA 139 06/20/2021 1358   K 4.3 06/20/2021 1358   CL 104 06/20/2021 1358   CO2 26 06/20/2021 1358   BUN 22 06/20/2021 1358   CREATININE 1.43 (H) 06/20/2021 1358      Component Value Date/Time   CALCIUM 9.7 06/20/2021 1358   ALKPHOS 58 06/20/2021 1358   AST 15 06/20/2021 1358   ALT 10 06/20/2021 1358   BILITOT 0.5 06/20/2021 1358       RADIOGRAPHIC STUDIES:  CT Chest W  Contrast  Result Date: 06/21/2021 CLINICAL DATA:  Primary Cancer Type: Lung Imaging Indication: Assess response to therapy Interval therapy since last imaging? Yes Initial Cancer Diagnosis Date: 10/08/2020; Established by: Biopsy-proven Detailed Pathology: Stage IA non-small cell lung cancer, squamous cell carcinoma. Primary Tumor location: Right upper lobe. Surgeries: Aborted robotic assisted right video thoracoscopy 10/07/2020. Chemotherapy: No Immunotherapy? No Radiation therapy? Yes; Date Range: 11/02/2020 - 11/09/2020; Target: Right upper lung EXAM: CT CHEST WITH CONTRAST TECHNIQUE: Multidetector CT imaging of the chest was performed during  intravenous contrast administration. CONTRAST:  13mL OMNIPAQUE IOHEXOL 350 MG/ML SOLN COMPARISON:  Most recent CT chest 09/30/2020.  08/26/2020 PET-CT. FINDINGS: Cardiovascular: Aortic atherosclerosis. Normal heart size. Three-vessel coronary artery calcifications. No pericardial effusion. Mediastinum/Nodes: No enlarged mediastinal, hilar, or axillary lymph nodes. Thyroid gland, trachea, and esophagus demonstrate no significant findings. Lungs/Pleura: Significant interval decrease in size of a spiculated nodule of the medial right apex, now measuring 0.9 x 0.6 cm, previously 1.4 x 1.4 cm (series 5, image 30). There is new adjacent ground-glass and septal thickening. Scattered small pneumatoceles throughout the lungs. No pleural effusion or pneumothorax. Upper Abdomen: No acute abnormality. Musculoskeletal: No chest wall mass or suspicious bone lesions identified. IMPRESSION: 1. Significant interval decrease in size of a spiculated nodule of the medial right apex. There is new adjacent ground-glass and septal thickening. Findings are consistent with treatment response to radiation therapy and developing radiation pneumonitis/fibrosis. 2. Coronary artery disease. Aortic Atherosclerosis (ICD10-I70.0). Electronically Signed   By: Delanna Ahmadi M.D.   On: 06/21/2021 10:34     ASSESSMENT/PLAN:  This is a very pleasant 70 year old Caucasian male diagnosed with a stage Ia (T1b, N0, M0) non-small cell lung cancer, squamous cell carcinoma.  He presented with a right upper lobe lung nodule.  He was treated with curative SBRT under the care of Dr. Lisbeth Renshaw on November 09, 2020.  The patient is currently on observation and feeling fine today.  The patient recently had a restaging CT scan performed.  Dr. Julien Nordmann personally and independently reviewed the scan and discussed results with the patient today.  The scan showed no evidence of disease progression.  Dr. Julien Nordmann recommends that he continue on observation with a restaging  CT scan in 6 months.  We will see him back for follow-up visit at that time for evaluation and to review his scan results.  The patient was advised to call immediately if he has any concerning symptoms in the interval. The patient voices understanding of current disease status and treatment options and is in agreement with the current care plan. All questions were answered. The patient knows to call the clinic with any problems, questions or concerns. We can certainly see the patient much sooner if necessary      Orders Placed This Encounter  Procedures   CT Chest W Contrast    Can hold contrast if elevated creatinine that day with labs that day.    Standing Status:   Future    Standing Expiration Date:   06/22/2022    Order Specific Question:   If indicated for the ordered procedure, I authorize the administration of contrast media per Radiology protocol    Answer:   Yes    Order Specific Question:   Preferred imaging location?    Answer:   [facility]   CBC with Differential ([place] Only)    Standing Status:   Future    Standing Expiration Date:   06/22/2022   CMP ([place] only)  Standing Status:   Future    Standing Expiration Date:   06/22/2022      Luis Sos Loreta Blouch, PA-C 06/22/21  ADDENDUM: Hematology/Oncology Attending: I had a face-to-face encounter with the patient today.  I reviewed his record, lab and scan and recommended his care plan.  This is a very pleasant 70 years old white male with a stage IA (T1b, N0, M0) non-small cell lung cancer, squamous cell carcinoma presented with right upper lobe lung nodule status post SBRT under the care of Dr. Lisbeth Renshaw in April 2022. The patient is currently on observation and he is feeling fine today with no concerning complaints. He had repeat CT scan of the chest performed recently.  I personally and independently reviewed the scan and discussed the results with the patient and his wife today. His  scan showed no concerning findings for disease recurrence or metastasis. I recommended for him to continue on observation with repeat CT scan of the chest in 6 months. The patient was advised to call immediately if he has any other concerning symptoms in the interval. Disclaimer: This note was dictated with voice recognition software. Similar sounding words can inadvertently be transcribed and may be missed upon review. Eilleen Kempf, MD 06/22/21

## 2021-06-22 ENCOUNTER — Inpatient Hospital Stay: Payer: Medicare HMO | Admitting: Physician Assistant

## 2021-06-22 ENCOUNTER — Other Ambulatory Visit: Payer: Self-pay

## 2021-06-22 VITALS — BP 147/81 | HR 58 | Temp 97.6°F | Resp 18 | Ht 73.0 in | Wt 224.0 lb

## 2021-06-22 DIAGNOSIS — C3411 Malignant neoplasm of upper lobe, right bronchus or lung: Secondary | ICD-10-CM

## 2021-06-22 DIAGNOSIS — I2510 Atherosclerotic heart disease of native coronary artery without angina pectoris: Secondary | ICD-10-CM | POA: Diagnosis not present

## 2021-06-22 DIAGNOSIS — Z87891 Personal history of nicotine dependence: Secondary | ICD-10-CM | POA: Diagnosis not present

## 2021-06-22 DIAGNOSIS — J449 Chronic obstructive pulmonary disease, unspecified: Secondary | ICD-10-CM | POA: Diagnosis not present

## 2021-06-22 DIAGNOSIS — R059 Cough, unspecified: Secondary | ICD-10-CM | POA: Diagnosis not present

## 2021-06-22 DIAGNOSIS — Z85118 Personal history of other malignant neoplasm of bronchus and lung: Secondary | ICD-10-CM | POA: Diagnosis not present

## 2021-06-30 ENCOUNTER — Encounter: Payer: Self-pay | Admitting: Pulmonary Disease

## 2021-07-01 NOTE — Telephone Encounter (Signed)
BI please advise on which of the Trelegy he will stay on.  He has been given the 100 and 200 as samples.  Thanks

## 2021-07-07 NOTE — Telephone Encounter (Incomplete)
BI  The pt is not able to qualify for assistance for the Trelegy.  He stated that he is going to have to a way to pay for this out of pocket.  When I looked this up on good rx it is over $600 per month.  Any other suggestions?

## 2021-07-27 MED ORDER — TRELEGY ELLIPTA 100-62.5-25 MCG/ACT IN AEPB: 1.0000 | INHALATION_SPRAY | Freq: Every day | RESPIRATORY_TRACT | 5 refills | Status: DC

## 2021-12-08 ENCOUNTER — Encounter: Payer: Self-pay | Admitting: Internal Medicine

## 2021-12-16 ENCOUNTER — Other Ambulatory Visit: Payer: Medicare HMO

## 2021-12-20 ENCOUNTER — Ambulatory Visit (HOSPITAL_COMMUNITY)
Admission: RE | Admit: 2021-12-20 | Discharge: 2021-12-20 | Disposition: A | Discharge status: Home / Self Care | Payer: Medicare HMO | Source: Ambulatory Visit | Attending: Physician Assistant | Admitting: Physician Assistant

## 2021-12-20 ENCOUNTER — Other Ambulatory Visit: Payer: Self-pay

## 2021-12-20 ENCOUNTER — Inpatient Hospital Stay: Payer: Medicare HMO | Attending: Internal Medicine

## 2021-12-20 DIAGNOSIS — C3411 Malignant neoplasm of upper lobe, right bronchus or lung: Secondary | ICD-10-CM | POA: Insufficient documentation

## 2021-12-20 DIAGNOSIS — Z923 Personal history of irradiation: Secondary | ICD-10-CM | POA: Insufficient documentation

## 2021-12-20 LAB — CBC WITH DIFFERENTIAL (CANCER CENTER ONLY)
Abs Immature Granulocytes: 0.01 10*3/uL (ref 0.00–0.07)
Basophils Absolute: 0 10*3/uL (ref 0.0–0.1)
Basophils Relative: 1 %
Eosinophils Absolute: 0.1 10*3/uL (ref 0.0–0.5)
Eosinophils Relative: 2 %
HCT: 37.9 % — ABNORMAL LOW (ref 39.0–52.0)
Hemoglobin: 12.2 g/dL — ABNORMAL LOW (ref 13.0–17.0)
Immature Granulocytes: 0 %
Lymphocytes Relative: 22 %
Lymphs Abs: 1.4 10*3/uL (ref 0.7–4.0)
MCH: 31.4 pg (ref 26.0–34.0)
MCHC: 32.2 g/dL (ref 30.0–36.0)
MCV: 97.4 fL (ref 80.0–100.0)
Monocytes Absolute: 0.4 10*3/uL (ref 0.1–1.0)
Monocytes Relative: 7 %
Neutro Abs: 4.2 10*3/uL (ref 1.7–7.7)
Neutrophils Relative %: 68 %
Platelet Count: 237 10*3/uL (ref 150–400)
RBC: 3.89 MIL/uL — ABNORMAL LOW (ref 4.22–5.81)
RDW: 13.5 % (ref 11.5–15.5)
WBC Count: 6.2 10*3/uL (ref 4.0–10.5)
nRBC: 0 % (ref 0.0–0.2)

## 2021-12-20 LAB — CMP (CANCER CENTER ONLY)
ALT: 10 U/L (ref 0–44)
AST: 14 U/L — ABNORMAL LOW (ref 15–41)
Albumin: 4.2 g/dL (ref 3.5–5.0)
Alkaline Phosphatase: 60 U/L (ref 38–126)
Anion gap: 4 — ABNORMAL LOW (ref 5–15)
BUN: 20 mg/dL (ref 8–23)
CO2: 31 mmol/L (ref 22–32)
Calcium: 9.8 mg/dL (ref 8.9–10.3)
Chloride: 105 mmol/L (ref 98–111)
Creatinine: 1.4 mg/dL — ABNORMAL HIGH (ref 0.61–1.24)
GFR, Estimated: 53 mL/min — ABNORMAL LOW (ref >60)
Glucose, Bld: 111 mg/dL — ABNORMAL HIGH (ref 70–99)
Potassium: 4.6 mmol/L (ref 3.5–5.1)
Sodium: 140 mmol/L (ref 135–145)
Total Bilirubin: 0.4 mg/dL (ref 0.3–1.2)
Total Protein: 7 g/dL (ref 6.5–8.1)

## 2021-12-20 MED ORDER — SODIUM CHLORIDE (PF) 0.9 % IJ SOLN
INTRAMUSCULAR | Status: AC
  Filled 2021-12-20: qty 50

## 2021-12-20 MED ORDER — IOHEXOL 300 MG/ML  SOLN
75.0000 mL | Freq: Once | INTRAMUSCULAR | Status: AC | PRN
  Administered 2021-12-20: 75 mL via INTRAVENOUS

## 2021-12-21 ENCOUNTER — Inpatient Hospital Stay: Payer: Medicare HMO | Admitting: Internal Medicine

## 2021-12-21 ENCOUNTER — Encounter: Payer: Self-pay | Admitting: Internal Medicine

## 2021-12-21 VITALS — BP 138/70 | HR 50 | Temp 97.7°F | Resp 18 | Ht 73.0 in | Wt 226.1 lb

## 2021-12-21 DIAGNOSIS — C3411 Malignant neoplasm of upper lobe, right bronchus or lung: Secondary | ICD-10-CM

## 2021-12-21 DIAGNOSIS — C3490 Malignant neoplasm of unspecified part of unspecified bronchus or lung: Secondary | ICD-10-CM

## 2021-12-21 NOTE — Progress Notes (Signed)
[place] [phone]   [phone]  OFFICE PROGRESS NOTE  Care, [name] [address] [place] [address]  DIAGNOSIS: Stage IA (T1b, N0, M0) non-small cell lung cancer, squamous cell carcinoma presented with right upper lobe lung nodule.  Diagnosed in March 2022   PRIOR THERAPY: SBRT under the care of Dr. Lisbeth Renshaw completed on November 09, 2020.   CURRENT THERAPY: Observation  INTERVAL HISTORY: Luis Haney 71 y.o. male returns to the clinic today for 6 months follow-up visit accompanied by his wife.  The patient is feeling fine today with no concerning complaints except for intermittent pain that moved from the left to the right side of his chest.  He denied having any cough or shortness of breath and no hemoptysis.  He denied having any recent weight loss or night sweats.  He has no nausea, vomiting, diarrhea or constipation.  He has no headache or visual changes.  He is here today for evaluation with repeat CT scan of the chest for restaging of his disease.  MEDICAL HISTORY: Past Medical History:  Diagnosis Date   COPD (chronic obstructive pulmonary disease) ([place])    Dyspnea    occasuional with exertion   Lung cancer ([place]) 09/2020   Pleural effusion 06/2020   Pneumonia    Pulmonary nodule 09/2020    ALLERGIES:  has No Known Allergies.  MEDICATIONS:  Current Outpatient Medications  Medication Sig Dispense Refill   albuterol (VENTOLIN HFA) 108 (90 Base) MCG/ACT inhaler Inhale 2 puffs into the lungs every 6 (six) hours as needed for wheezing or shortness of breath. 8 g 6   calcium carbonate (OS-CAL - DOSED IN MG OF ELEMENTAL CALCIUM) 1250 (500 Ca) MG tablet Take 1 tablet by mouth 3 (three) times daily with meals.     Fluticasone-Umeclidin-Vilant (TRELEGY ELLIPTA) 100-62.5-25 MCG/ACT AEPB Inhale 1 puff into the lungs daily. 1 each 5   Multiple Vitamin (MULTI VITAMIN MENS PO) Take 1 tablet by mouth daily.     Potassium 99 MG TABS  Take 99 mg by mouth daily.     No current facility-administered medications for this visit.   Facility-Administered Medications Ordered in Other Visits  Medication Dose Route Frequency Provider Last Rate Last Admin   albuterol (PROVENTIL) (2.5 MG/3ML) 0.083% nebulizer solution 2.5 mg  2.5 mg Nebulization Once Martyn Ehrich, NP        SURGICAL HISTORY:  Past Surgical History:  Procedure Laterality Date   BACK SURGERY  2000   BRONCHIAL BIOPSY  10/07/2020   Procedure: BRONCHIAL BIOPSIES;  Surgeon: Collene Gobble, MD;  Location: [facility] ENDOSCOPY;  Service: Pulmonary;;   BRONCHIAL BRUSHINGS  10/07/2020   Procedure: BRONCHIAL BRUSHINGS;  Surgeon: Collene Gobble, MD;  Location: [facility] ENDOSCOPY;  Service: Pulmonary;;   BRONCHIAL NEEDLE ASPIRATION BIOPSY  10/07/2020   Procedure: BRONCHIAL NEEDLE ASPIRATION BIOPSIES;  Surgeon: Collene Gobble, MD;  Location: [place];  Service: Pulmonary;;   CHEST TUBE INSERTION Right 06/2020   CHEST TUBE INSERTION Right 10/07/2020   NECK SURGERY  2012   SUBMUCOSAL INJECTION  10/07/2020   Procedure: FIDUCIAL DYE MARKING;  Surgeon: Collene Gobble, MD;  Location: [place];  Service: Pulmonary;;   TONSILLECTOMY     removed as a chld   VIDEO BRONCHOSCOPY WITH ENDOBRONCHIAL NAVIGATION N/A 10/07/2020   Procedure: VIDEO BRONCHOSCOPY WITH ENDOBRONCHIAL NAVIGATION;  Surgeon: Collene Gobble, MD;  Location: [place] ENDOSCOPY;  Service: Pulmonary;  Laterality: N/A;  REVIEW OF SYSTEMS:  A comprehensive review of systems was negative.   PHYSICAL EXAMINATION: General appearance: alert, cooperative, and no distress Head: Normocephalic, without obvious abnormality, atraumatic Neck: no adenopathy, no JVD, supple, symmetrical, trachea midline, and thyroid not enlarged, symmetric, no tenderness/mass/nodules Lymph nodes: Cervical, supraclavicular, and axillary nodes normal. Resp: clear to auscultation bilaterally Back: symmetric, no curvature. ROM normal. No CVA  tenderness. Cardio: regular rate and rhythm, S1, S2 normal, no murmur, click, rub or gallop GI: soft, non-tender; bowel sounds normal; no masses,  no organomegaly Extremities: extremities normal, atraumatic, no cyanosis or edema  ECOG PERFORMANCE STATUS: 1 - Symptomatic but completely ambulatory  Blood pressure 138/70, pulse (!) 50, temperature 97.7 F (36.5 C), temperature source Oral, resp. rate 18, height 6\' 1"  (1.854 m), weight 226 lb 1.6 oz (102.6 kg), SpO2 98 %.  LABORATORY DATA: Lab Results  Component Value Date   WBC 6.2 12/20/2021   HGB 12.2 (L) 12/20/2021   HCT 37.9 (L) 12/20/2021   MCV 97.4 12/20/2021   PLT 237 12/20/2021      Chemistry      Component Value Date/Time   NA 140 12/20/2021 1125   K 4.6 12/20/2021 1125   CL 105 12/20/2021 1125   CO2 31 12/20/2021 1125   BUN 20 12/20/2021 1125   CREATININE 1.40 (H) 12/20/2021 1125      Component Value Date/Time   CALCIUM 9.8 12/20/2021 1125   ALKPHOS 60 12/20/2021 1125   AST 14 (L) 12/20/2021 1125   ALT 10 12/20/2021 1125   BILITOT 0.4 12/20/2021 1125       RADIOGRAPHIC STUDIES: CT Chest W Contrast  Result Date: 12/21/2021 CLINICAL DATA:  Non-small cell lung cancer; * Tracking Code: BO * EXAM: CT CHEST WITH CONTRAST TECHNIQUE: Multidetector CT imaging of the chest was performed during intravenous contrast administration. RADIATION DOSE REDUCTION: This exam was performed according to the departmental dose-optimization program which includes automated exposure control, adjustment of the mA and/or kV according to patient size and/or use of iterative reconstruction technique. CONTRAST:  83mL OMNIPAQUE IOHEXOL 300 MG/ML  SOLN COMPARISON:  Chest CT dated June 20, 2021 FINDINGS: Cardiovascular: Normal heart size. No pericardial effusion. Left main and three-vessel coronary artery calcifications. Atherosclerotic disease of the thoracic aorta. No suspicious filling defects of the central pulmonary arteries.  Mediastinum/Nodes: Esophagus and thyroid are unremarkable. Low-density rounded opacity located inferior to the right pulmonary vein, compatible with loculated pericardial recess fluid. No pathologically enlarged lymph nodes seen in the chest. Lungs/Pleura: Central airways are patent. Mild centrilobular emphysema. Increased linear opacities of the right upper lobe with right upper lobe nodule no longer visible, compatible with evolving postradiation change. No new or enlarging pulmonary nodules. Upper Abdomen: Unchanged simple appearing cyst of the anterior right kidney. No acute abnormality. Musculoskeletal: No chest wall abnormality. No acute or significant osseous findings. IMPRESSION: 1. Expected evolution of right upper lobe post radiation change. No evidence of recurrent or metastatic disease. 2. Coronary artery calcifications and aortic Atherosclerosis (ICD10-I70.0). Electronically Signed   By: Yetta Glassman M.D.   On: 12/21/2021 08:49    ASSESSMENT AND PLAN: This is a very pleasant 71 years old white male diagnosed with a stage Ia (T1b, N0, M0) non-small cell lung cancer, squamous cell carcinoma presented with right upper lobe lung nodule diagnosed in March 2022 status post SBRT under the care of Dr. Lisbeth Renshaw.  The patient is currently on observation and he is feeling fine with no concerning complaints. He had repeat CT scan  of the chest performed recently.  I personally and independently reviewed the scans and discussed the results with the patient and his wife. His scan showed no concerning findings for disease progression or metastasis.  He continues to have the expected evaluation of the postradiation changes in the right upper lobe. I recommended for the patient to continue on observation with repeat CT scan of the chest in 6 months. He was advised to call immediately if he has any other concerning symptoms in the interval. The patient voices understanding of current disease status and treatment  options and is in agreement with the current care plan.  All questions were answered. The patient knows to call the clinic with any problems, questions or concerns. We can certainly see the patient much sooner if necessary.  The total time spent in the appointment was 20 minutes.  Disclaimer: This note was dictated with voice recognition software. Similar sounding words can inadvertently be transcribed and may not be corrected upon review.

## 2022-02-13 IMAGING — CT CT BIOPY CORE LUNG/MEDIASTINUM
2 of 10 series · 9 of 32 positions shown, 13 images · non-contrast
Comparison: none

INDICATION: 69-year-old male with a history of right upper lobe lung nodule, FDG
positive

[Series 2: i-spiral 5.0 b40f · axial · 0.78mm/px · z∈[+1329,+1518]mm · 3 of 55 slices shown, 7 images]
[im 1/55  soft-tissue]
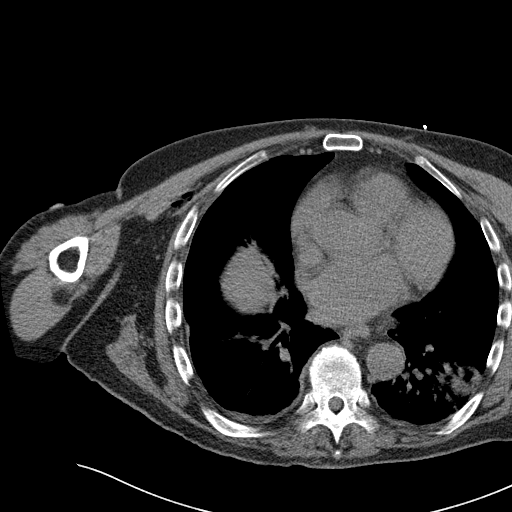
[im 1/55  lung]
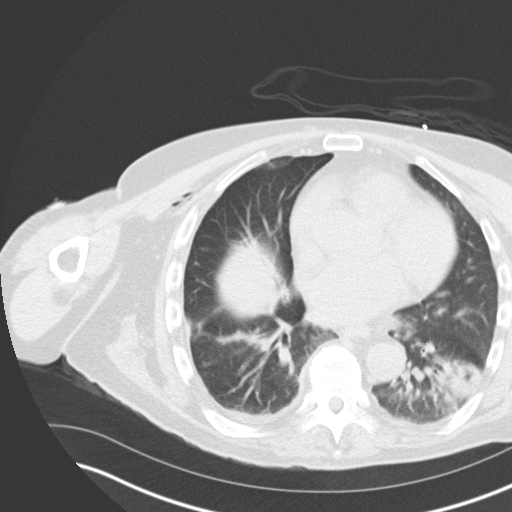
[im 1/55  bone]
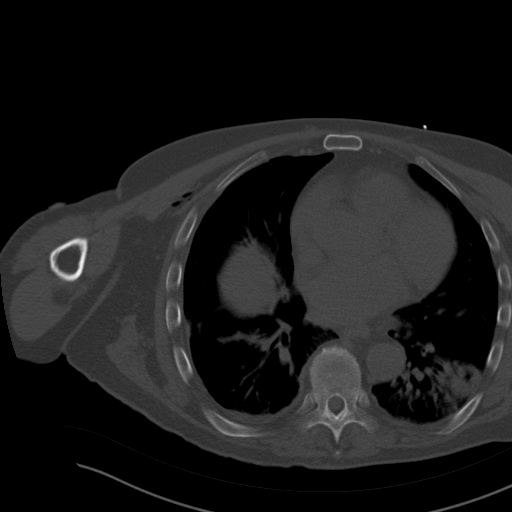
[im 28/55  soft-tissue]
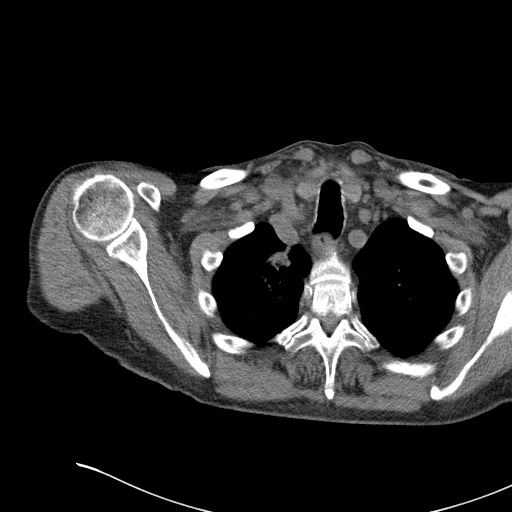
[im 28/55  lung]
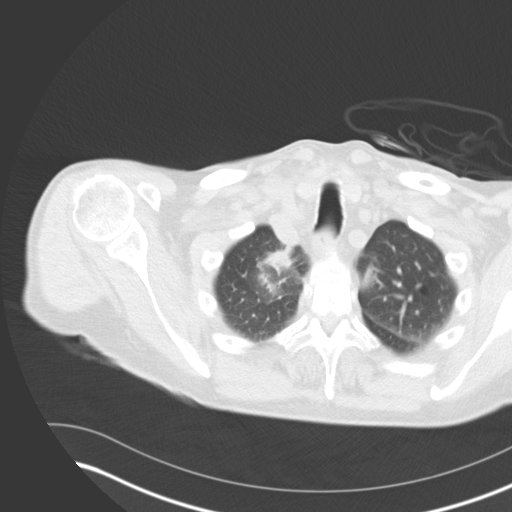
[im 55/55  soft-tissue]
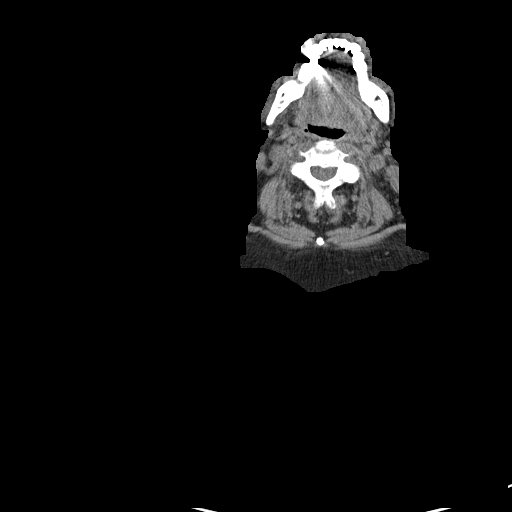
[im 55/55  lung]
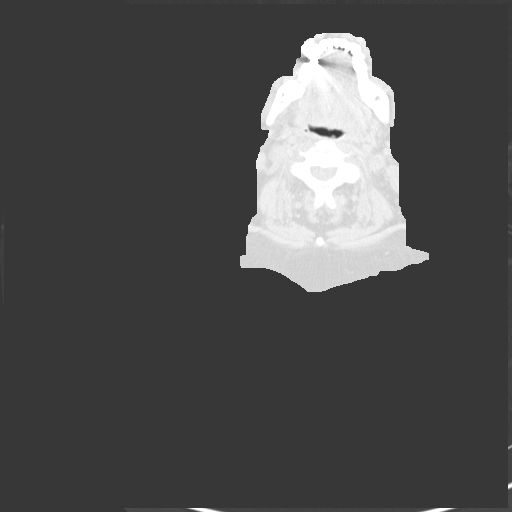

[Series 7: i-sequence 4.8 b40s · axial · 0.73mm/px · z∈[+1408,+1432]mm · 6 of 102 slices shown]
[im 15/102  soft-tissue]
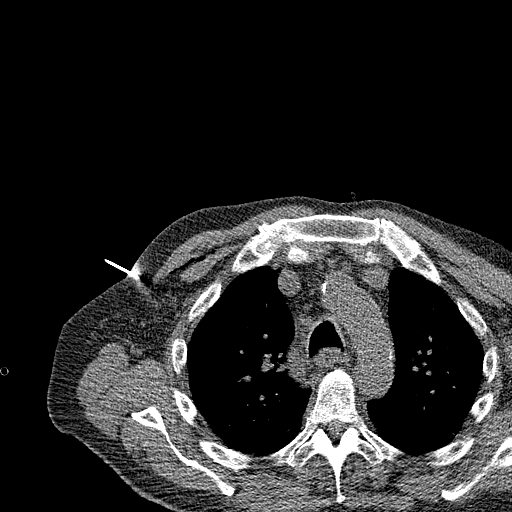
[im 29/102  soft-tissue]
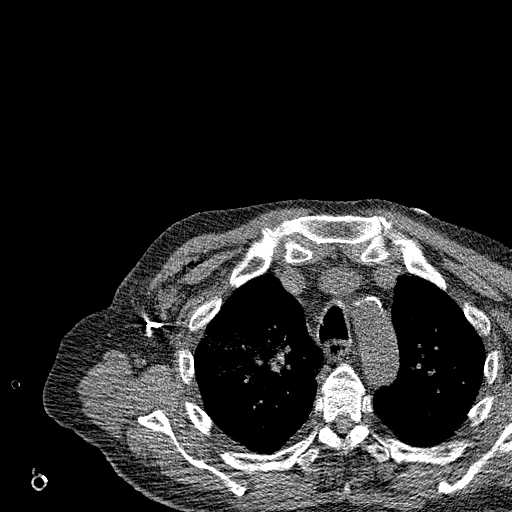
[im 44/102  soft-tissue]
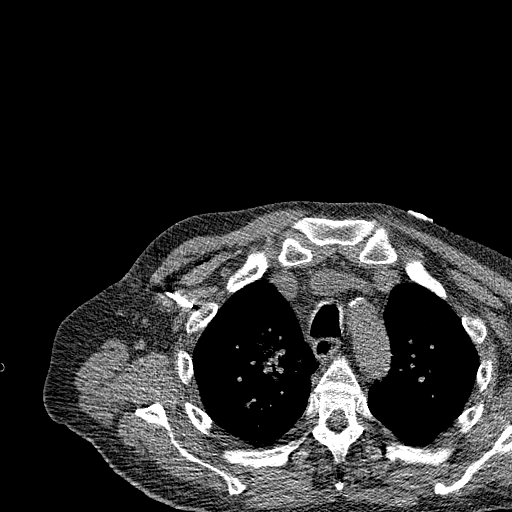
[im 58/102  soft-tissue]
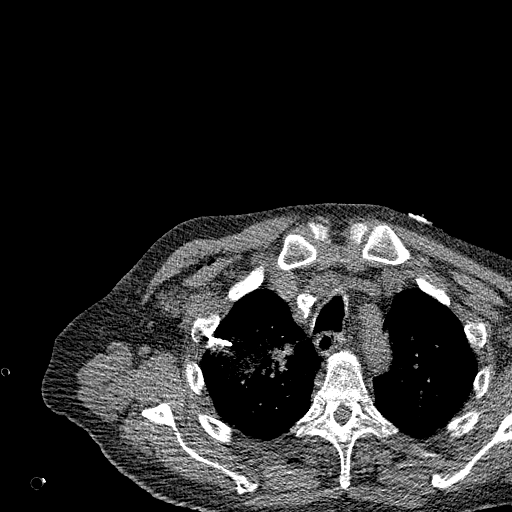
[im 73/102  soft-tissue]
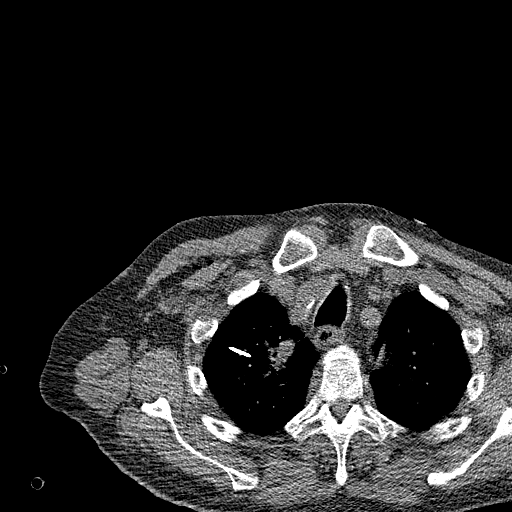
[im 87/102  soft-tissue]
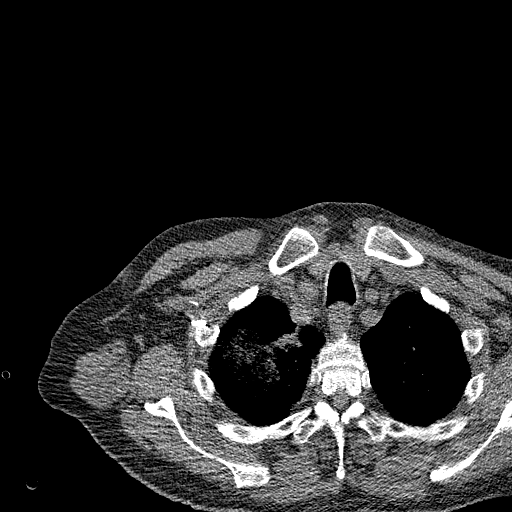

[9 of 32 positions shown; findings below may reference images not displayed]

EXAM:
IMAGE GUIDED BIOPSY RIGHT UPPER LOBE NODULE

MEDICATIONS:
None.

ANESTHESIA/SEDATION:
Moderate (conscious) sedation was employed during this procedure. A
total of Versed 1.5 mg and Fentanyl 50 mcg was administered
intravenously.

Moderate Sedation Time: 47 minutes. The patient's level of
consciousness and vital signs were monitored continuously by
radiology nursing throughout the procedure under my direct
supervision.

FLUOROSCOPY TIME:  CT

COMPLICATIONS:
None

PROCEDURE:
The procedure, risks, benefits, and alternatives were explained to
the patient and the patient's family. Specific risks that were
addressed included bleeding, infection, pneumothorax, need for
further procedure including chest tube placement, chance of delayed
pneumothorax or hemorrhage, hemoptysis, nondiagnostic sample,
cardiopulmonary collapse, death. Questions regarding the procedure
were encouraged and answered. The patient understands and consents
to the procedure.

Patient was positioned in the supine position on the CT gantry table
and a scout CT of the chest was performed for planning purposes.

Once angle of approach was determined, the skin and subcutaneous
tissues this scan was prepped and draped in the usual sterile
fashion, and a sterile drape was applied covering the operative
field. A sterile gown and sterile gloves were used for the
procedure. Local anesthesia was provided with 1% Lidocaine.

The skin and subcutaneous tissues were infiltrated 1% lidocaine for
local anesthesia, and a small stab incision was made with an 11
blade scalpel.

Using CT guidance, attempt to advance a 17 gauge trocar needle was
performed into the right upper lobe nodule. The initial positioning
and respiratory motion allowed only an inferior to superior
trajectory. The initial needle position from a right axillary
approach trajectory resulted in hemoptysis and parenchymal
hemorrhage within the pulmonary tissue.

After this initial attempt, the needle was withdrawn and the patient
was observed for 5 minutes. Repeat CT confirmed that there was
stability within the pulmonary hemorrhage with no expansion. Given
the patient remained hemodynamically normal and comfortable, we
proceeded with additional attempt at biopsy. At this point, the
patient had developed a better window for approach. 17 gauge guide
needle was then again advanced with CT guidance into the right upper
lobe from a more direct, orthogonal approach to the CT images. After
confirmation of the tip, separate 18 gauge core biopsies were
performed. These were placed into solution for transportation to the
lab.

A final CT image was performed.

Patient tolerated the procedure well and remained hemodynamically
stable throughout.

No complications were encountered and no significant blood loss was
encounter
IMPRESSION: Status post CT-guided biopsy of right upper lobe nodule. Tissue
specimen sent to pathology for complete histopathologic analysis.

## 2022-02-14 IMAGING — DX DG CHEST 1V PORT
1 series · 1 of 1 positions shown · non-contrast
Comparison: CT images from attempted biopsy 10/08/2020. Chest
radiograph 10/08/2020.

CLINICAL DATA: Surgery, follow-up. Additional history provided:
Status post attempted bat/right upper lobectomy [DATE], unsuccessful
due to fibrothorax with dense adhesions.

EXAM:
PORTABLE CHEST 1 VIEW

[chest ap]
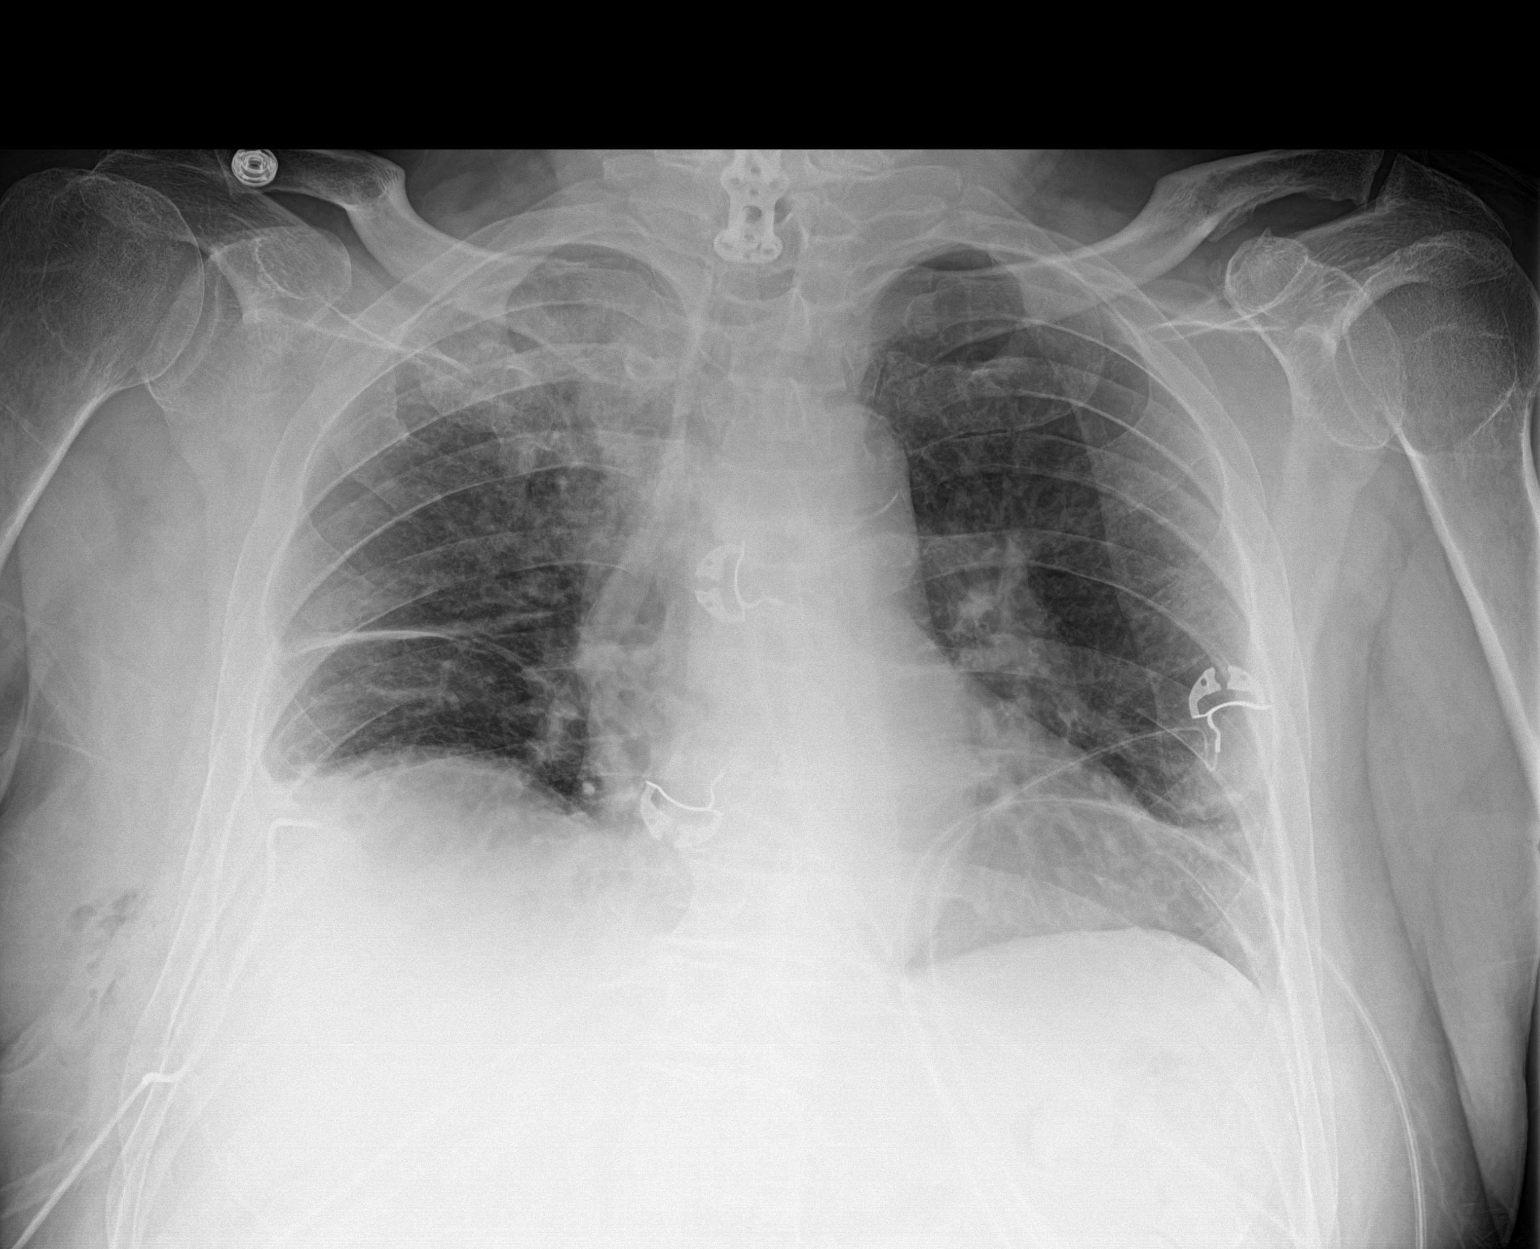

[1 of 1 positions shown; findings below may reference images not displayed]

FINDINGS: Unchanged position of a right chest tube at the right lung base. No
pneumothorax is identified. The cardiomediastinal silhouette is
unchanged. Aortic atherosclerosis. Unchanged thickening of the right
minor fissure. Persistent mild right basilar atelectasis and
possible trace right pleural effusion. ACDF hardware within the
lower cervical spine. Redemonstrated small volume soft tissue gas
within the right chest wall.
IMPRESSION: No significant change as compared to the chest radiograph of
10/08/2020.

Unchanged position of a right chest tube. No appreciable
pneumothorax.

Persistent mild thickening of the right minor fissure and mild right
basilar atelectasis. A trace right pleural effusion may also be
present.

## 2022-02-15 ENCOUNTER — Other Ambulatory Visit: Payer: Self-pay | Admitting: Pulmonary Disease

## 2022-03-22 ENCOUNTER — Telehealth: Payer: Self-pay | Admitting: Internal Medicine

## 2022-03-22 NOTE — Telephone Encounter (Signed)
Called patient regarding upcoming November appointments, left a voicemail.

## 2022-06-08 ENCOUNTER — Telehealth: Payer: Self-pay | Admitting: Physician Assistant

## 2022-06-08 NOTE — Telephone Encounter (Signed)
Rescheduled 11/30 appointment to 12/07 due to provider pal, patient has been called and voicemail was left.

## 2022-06-20 ENCOUNTER — Inpatient Hospital Stay: Payer: Medicare HMO | Attending: Physician Assistant

## 2022-06-20 ENCOUNTER — Ambulatory Visit (HOSPITAL_COMMUNITY)
Admission: RE | Admit: 2022-06-20 | Discharge: 2022-06-20 | Disposition: A | Discharge status: Home / Self Care | Payer: Medicare HMO | Source: Ambulatory Visit | Attending: Internal Medicine | Admitting: Internal Medicine

## 2022-06-20 ENCOUNTER — Encounter (HOSPITAL_COMMUNITY): Payer: Self-pay

## 2022-06-20 DIAGNOSIS — C3411 Malignant neoplasm of upper lobe, right bronchus or lung: Secondary | ICD-10-CM | POA: Insufficient documentation

## 2022-06-20 DIAGNOSIS — C3490 Malignant neoplasm of unspecified part of unspecified bronchus or lung: Secondary | ICD-10-CM | POA: Insufficient documentation

## 2022-06-20 LAB — CBC WITH DIFFERENTIAL (CANCER CENTER ONLY)
Abs Immature Granulocytes: 0.02 10*3/uL (ref 0.00–0.07)
Basophils Absolute: 0.1 10*3/uL (ref 0.0–0.1)
Basophils Relative: 1 %
Eosinophils Absolute: 0.2 10*3/uL (ref 0.0–0.5)
Eosinophils Relative: 2 %
HCT: 41.6 % (ref 39.0–52.0)
Hemoglobin: 13.5 g/dL (ref 13.0–17.0)
Immature Granulocytes: 0 %
Lymphocytes Relative: 23 %
Lymphs Abs: 1.6 10*3/uL (ref 0.7–4.0)
MCH: 31.7 pg (ref 26.0–34.0)
MCHC: 32.5 g/dL (ref 30.0–36.0)
MCV: 97.7 fL (ref 80.0–100.0)
Monocytes Absolute: 0.5 10*3/uL (ref 0.1–1.0)
Monocytes Relative: 7 %
Neutro Abs: 4.7 10*3/uL (ref 1.7–7.7)
Neutrophils Relative %: 67 %
Platelet Count: 238 10*3/uL (ref 150–400)
RBC: 4.26 MIL/uL (ref 4.22–5.81)
RDW: 13.2 % (ref 11.5–15.5)
WBC Count: 7 10*3/uL (ref 4.0–10.5)
nRBC: 0 % (ref 0.0–0.2)

## 2022-06-20 LAB — CMP (CANCER CENTER ONLY)
ALT: 7 U/L (ref 0–44)
AST: 12 U/L — ABNORMAL LOW (ref 15–41)
Albumin: 4.6 g/dL (ref 3.5–5.0)
Alkaline Phosphatase: 61 U/L (ref 38–126)
Anion gap: 5 (ref 5–15)
BUN: 19 mg/dL (ref 8–23)
CO2: 30 mmol/L (ref 22–32)
Calcium: 10.2 mg/dL (ref 8.9–10.3)
Chloride: 106 mmol/L (ref 98–111)
Creatinine: 1.28 mg/dL — ABNORMAL HIGH (ref 0.61–1.24)
GFR, Estimated: 59 mL/min — ABNORMAL LOW (ref >60)
Glucose, Bld: 107 mg/dL — ABNORMAL HIGH (ref 70–99)
Potassium: 4.5 mmol/L (ref 3.5–5.1)
Sodium: 141 mmol/L (ref 135–145)
Total Bilirubin: 0.7 mg/dL (ref 0.3–1.2)
Total Protein: 7.7 g/dL (ref 6.5–8.1)

## 2022-06-20 MED ORDER — IOHEXOL 300 MG/ML  SOLN
75.0000 mL | Freq: Once | INTRAMUSCULAR | Status: AC | PRN
  Administered 2022-06-20: 75 mL via INTRAVENOUS

## 2022-06-22 ENCOUNTER — Ambulatory Visit: Payer: Medicare HMO | Admitting: Internal Medicine

## 2022-06-28 NOTE — Progress Notes (Unsigned)
[facility] OFFICE PROGRESS NOTE  Care, [name] [address] [place] [address]  DIAGNOSIS: Stage IA (T1b, N0, M0) non-small cell lung cancer, squamous cell carcinoma presented with right upper lobe lung nodule.  Diagnosed in March 2022   PRIOR THERAPY: SBRT under the care of Dr. Lisbeth Renshaw completed on November 09, 2020.   CURRENT THERAPY: None  INTERVAL HISTORY: IRVEN INGALSBE 71 y.o. male returns to the clinic today for a 76-month follow-up visit accompanied by his wife.  The patient was last seen in clinic 6 months ago by Dr. Julien Nordmann.  The patient is being followed with surveillance imaging for stage Ia non-small cell lung cancer, squamous cell carcinoma.  The patient was initially diagnosed in March 2022 and completed SBRT to this lesion.  Since last being seen the patient denies any changes in his health.  Denies any fever, chills, night sweats, or unexplained weight loss.  Denies any chest pain, cough, or hemoptysis.  Reports stable dyspnea on exertion.  He denies any chest discomfort.  He denies any nausea, vomiting, or constipation.  He sometimes may have intermittent diarrhea with certain foods.  Denies any headache or visual changes.  The patient recently had a repeat CT scan performed.  The patient is here today for evaluation to review his scan results.    MEDICAL HISTORY: Past Medical History:  Diagnosis Date   COPD (chronic obstructive pulmonary disease) ([place])    Dyspnea    occasuional with exertion   Lung cancer ([facility]) 09/2020   Pleural effusion 06/2020   Pneumonia    Pulmonary nodule 09/2020    ALLERGIES:  has No Known Allergies.  MEDICATIONS:  Current Outpatient Medications  Medication Sig Dispense Refill   albuterol (VENTOLIN HFA) 108 (90 Base) MCG/ACT inhaler Inhale 2 puffs into the lungs every 6 (six) hours as needed for wheezing or shortness of breath. 8 g 6   calcium carbonate (OS-CAL - DOSED IN MG OF ELEMENTAL CALCIUM) 1250 (500 Ca) MG tablet  Take 1 tablet by mouth 3 (three) times daily with meals.     Multiple Vitamin (MULTI VITAMIN MENS PO) Take 1 tablet by mouth daily.     Potassium 99 MG TABS Take 99 mg by mouth daily.     TRELEGY ELLIPTA 100-62.5-25 MCG/ACT AEPB TAKE 1 PUFF BY MOUTH EVERY DAY 60 each 5   No current facility-administered medications for this visit.   Facility-Administered Medications Ordered in Other Visits  Medication Dose Route Frequency Provider Last Rate Last Admin   albuterol (PROVENTIL) (2.5 MG/3ML) 0.083% nebulizer solution 2.5 mg  2.5 mg Nebulization Once Martyn Ehrich, NP        SURGICAL HISTORY:  Past Surgical History:  Procedure Laterality Date   BACK SURGERY  2000   BRONCHIAL BIOPSY  10/07/2020   Procedure: BRONCHIAL BIOPSIES;  Surgeon: Collene Gobble, MD;  Location: [facility] ENDOSCOPY;  Service: Pulmonary;;   BRONCHIAL BRUSHINGS  10/07/2020   Procedure: BRONCHIAL BRUSHINGS;  Surgeon: Collene Gobble, MD;  Location: [facility] ENDOSCOPY;  Service: Pulmonary;;   BRONCHIAL NEEDLE ASPIRATION BIOPSY  10/07/2020   Procedure: BRONCHIAL NEEDLE ASPIRATION BIOPSIES;  Surgeon: Collene Gobble, MD;  Location: [facility];  Service: Pulmonary;;   CHEST TUBE INSERTION Right 06/2020   CHEST TUBE INSERTION Right 10/07/2020   NECK SURGERY  2012   SUBMUCOSAL INJECTION  10/07/2020   Procedure: FIDUCIAL DYE MARKING;  Surgeon: Collene Gobble, MD;  Location: [facility] ENDOSCOPY;  Service: Pulmonary;;   TONSILLECTOMY  removed as a chld   VIDEO BRONCHOSCOPY WITH ENDOBRONCHIAL NAVIGATION N/A 10/07/2020   Procedure: VIDEO BRONCHOSCOPY WITH ENDOBRONCHIAL NAVIGATION;  Surgeon: Collene Gobble, MD;  Location: [facility] ENDOSCOPY;  Service: Pulmonary;  Laterality: N/A;    REVIEW OF SYSTEMS:   Review of Systems  Constitutional: Negative for appetite change, chills, fatigue, fever and unexpected weight change.  HENT: Negative for mouth sores, nosebleeds, sore throat and trouble swallowing.   Eyes: Negative for eye problems and icterus.   Respiratory: Today for dyspnea exertion.  Negative for cough, hemoptysis, and wheezing.   Cardiovascular: Negative for chest pain and leg swelling.  Gastrointestinal: Send for occasional diarrhea.  Negative for abdominal pain, constipation,  nausea and vomiting.  Genitourinary: Negative for bladder incontinence, difficulty urinating, dysuria, frequency and hematuria.   Musculoskeletal: Negative for back pain, gait problem, neck pain and neck stiffness.  Skin: Negative for itching and rash.  Neurological: Negative for dizziness, extremity weakness, gait problem, headaches, light-headedness and seizures.  Hematological: Negative for adenopathy. Does not bruise/bleed easily.  Psychiatric/Behavioral: Negative for confusion, depression and sleep disturbance. The patient is not nervous/anxious.     PHYSICAL EXAMINATION:  Blood pressure (!) 142/71, pulse (!) 51, temperature 98.5 F (36.9 C), temperature source Oral, resp. rate (!) 22, weight 222 lb 3.2 oz (100.8 kg), SpO2 97 %.  ECOG PERFORMANCE STATUS: 1  Physical Exam  Constitutional: Oriented to person, place, and time and well-developed, well-nourished, and in no distress. HENT:  Head: Normocephalic and atraumatic.  Mouth/Throat: Oropharynx is clear and moist. No oropharyngeal exudate.  Eyes: Conjunctivae are normal. Right eye exhibits no discharge. Left eye exhibits no discharge. No scleral icterus.  Neck: Normal range of motion. Neck supple.  Cardiovascular: Normal rate, regular rhythm, normal heart sounds and intact distal pulses.   Pulmonary/Chest: Effort normal and breath sounds normal. No respiratory distress. No wheezes. No rales.  Abdominal: Soft. Bowel sounds are normal. Exhibits no distension and no mass. There is no tenderness.  Musculoskeletal: Normal range of motion. Exhibits no edema.  Lymphadenopathy:    No cervical adenopathy.  Neurological: Alert and oriented to person, place, and time. Exhibits normal muscle tone. Gait  normal. Coordination normal.  Skin: Skin is warm and dry. No rash noted. Not diaphoretic. No erythema. No pallor.  Psychiatric: Mood, memory and judgment normal.  Vitals reviewed.  LABORATORY DATA: Lab Results  Component Value Date   WBC 7.0 06/20/2022   HGB 13.5 06/20/2022   HCT 41.6 06/20/2022   MCV 97.7 06/20/2022   PLT 238 06/20/2022      Chemistry      Component Value Date/Time   NA 141 06/20/2022 1126   K 4.5 06/20/2022 1126   CL 106 06/20/2022 1126   CO2 30 06/20/2022 1126   BUN 19 06/20/2022 1126   CREATININE 1.28 (H) 06/20/2022 1126      Component Value Date/Time   CALCIUM 10.2 06/20/2022 1126   ALKPHOS 61 06/20/2022 1126   AST 12 (L) 06/20/2022 1126   ALT 7 06/20/2022 1126   BILITOT 0.7 06/20/2022 1126       RADIOGRAPHIC STUDIES:  CT Chest W Contrast  Result Date: 06/21/2022 CLINICAL DATA:  71 year old male with history of non-small cell lung cancer. Staging examination. * Tracking Code: BO * EXAM: CT CHEST WITH CONTRAST TECHNIQUE: Multidetector CT imaging of the chest was performed during intravenous contrast administration. RADIATION DOSE REDUCTION: This exam was performed according to the departmental dose-optimization program which includes automated exposure control, adjustment of the mA  and/or kV according to patient size and/or use of iterative reconstruction technique. CONTRAST:  74mL OMNIPAQUE IOHEXOL 300 MG/ML  SOLN COMPARISON:  CT of the chest 12/20/2021. FINDINGS: Cardiovascular: Heart size is normal. There is no significant pericardial fluid, thickening or pericardial calcification. There is aortic atherosclerosis, as well as atherosclerosis of the great vessels of the mediastinum and the coronary arteries, including calcified atherosclerotic plaque in the left main, left anterior descending, left circumflex and right coronary arteries. Mediastinum/Nodes: No pathologically enlarged mediastinal or hilar lymph nodes. Esophagus is unremarkable in  appearance. No axillary lymphadenopathy. Lungs/Pleura: When compared to prior examinations the area of prior radiation therapy in the apex of the right upper lobe is far more mass-like and solid in appearance on today's examination, measuring up to 4.4 x 2.3 cm (axial image 28 of series 5). Notably, this region demonstrates slightly convex margins. New nodule in the left lower lobe at the base (axial image 88 of series 5) measuring 1.1 x 0.5 cm (mean diameter of 8 mm). No acute consolidative airspace disease. No pleural effusions. Mild centrilobular emphysema. Upper Abdomen: Aortic atherosclerosis. Diffuse low attenuation throughout the visualized hepatic parenchyma, indicative of a background of hepatic steatosis. Low-attenuation lesion in the anterior aspect of the upper pole of the right kidney, incompletely visualized and therefore not characterized on today's examination, but statistically likely a cyst (no imaging follow-up recommended). Musculoskeletal: There are no aggressive appearing lytic or blastic lesions noted in the visualized portions of the skeleton. Orthopedic fixation hardware in the lower cervical spine incompletely imaged. IMPRESSION: 1. Unusual evolution of postradiation changes in the apex of the right upper lobe, as above. Specifically, there is an enlarging mass-like area in this region with slightly convex margins. It is conceivable that this simply represents evolving postradiation mass-like fibrosis, however, the changes rather striking when compared to the prior examination, and the possibility of locally recurrent disease should be considered. Further evaluation with PET-CT is recommended at this time to exclude the possibility of recurrent malignancy. 2. New small pulmonary nodule in the base of the left lower lobe, with a mean diameter of 8 mm. This is nonspecific, and may simply represent a subpleural lymph node, however, attention at time of forthcoming PET-CT is recommended. 3.  Aortic atherosclerosis, in addition to left main and three-vessel coronary artery disease. Please note that although the presence of coronary artery calcium documents the presence of coronary artery disease, the severity of this disease and any potential stenosis cannot be assessed on this non-gated CT examination. Assessment for potential risk factor modification, dietary therapy or pharmacologic therapy may be warranted, if clinically indicated. 4. Mild centrilobular emphysema. 5. Hepatic steatosis. Aortic Atherosclerosis (ICD10-I70.0) and Emphysema (ICD10-J43.9). Electronically Signed   By: Vinnie Langton M.D.   On: 06/21/2022 11:33     ASSESSMENT/PLAN:  This is a very pleasant 71 year old Caucasian male diagnosed initially with stage Ia (T1b, N0, M0) non-small cell lung cancer, squamous cell carcinoma.  The patient presented with a right upper lobe lung nodule in March 2022.  He completed SBRT under the care of Dr. Lisbeth Renshaw.  The patient has been on observation and feeling fine.  The patient recently had a restaging CT scan performed.  Dr. Julien Nordmann personally independently reviewed the scan discussed results with the patient today.  The scan showed unusual evolution of postradiation changes in the apex of the right upper lobe.  There is an area of an enlarging masslike in this area which could represent evolving postradiation masslike fibrosis; however,  the change is noted to be striking compared to prior examinations which raises the concern for possible recurrent disease.  There is also a new small pleural nodule in the left lower lobe which may represent a subpleural lymph node.  We will arrange for a PET scan to further characterize the areas.  Patient wants to wait until after the first of the year to have this performed.  We will see the patient back for follow-up visit in early January 2024 to review the results.   The patient was advised to call immediately if he has any concerning symptoms in  the interval. The patient voices understanding of current disease status and treatment options and is in agreement with the current care plan. All questions were answered. The patient knows to call the clinic with any problems, questions or concerns. We can certainly see the patient much sooner if necessary   Orders Placed This Encounter  Procedures   NM PET Image Restag (PS) Skull Base To Thigh    Standing Status:   Future    Standing Expiration Date:   06/29/2023    Order Specific Question:   If indicated for the ordered procedure, I authorize the administration of a radiopharmaceutical per Radiology protocol    Answer:   Yes    Order Specific Question:   Preferred imaging location?    Answer:   [place], [facility] 06/29/22  ADDENDUM: Hematology/Oncology Attending: I had a face-to-face encounter with the patient today.  I reviewed his record, lab, scan and recommended his care plan.  This is a very pleasant 71 years old white male with a stage Ia non-small cell lung cancer diagnosed in March 2022 status post SBRT under the care of Dr. Genia Harold in April 2022.  The patient is currently on observation.  He had repeat CT scan of the chest performed recently.  I personally and independently reviewed the scan images and discussed results with the patient today. Unfortunately his scan showed new small pulmonary nodule in the base of the left lower lobe measuring 0.8 cm that is nonspecific but there was also enlarging masslike area in the region of the post radiation changes that could be suspicious for local disease recurrence. I recommended for the patient to have a PET scan performed for further evaluation of his disease and to rule out disease recurrence locally. I will see him back for follow-up visit in 1 months for evaluation and discussion of his PET scan results and further recommendation regarding his condition. The patient was advised to call immediately if he has  any other concerning symptoms in the interval. The total time spent in the appointment was 30 minutes. Disclaimer: This note was dictated with voice recognition software. Similar sounding words can inadvertently be transcribed and may be missed upon review. Eilleen Kempf, MD

## 2022-06-29 ENCOUNTER — Inpatient Hospital Stay: Payer: Medicare HMO | Attending: Physician Assistant | Admitting: Physician Assistant

## 2022-06-29 ENCOUNTER — Ambulatory Visit: Payer: Medicare HMO | Admitting: Physician Assistant

## 2022-06-29 VITALS — BP 142/71 | HR 51 | Temp 98.5°F | Resp 22 | Wt 222.2 lb

## 2022-06-29 DIAGNOSIS — C3411 Malignant neoplasm of upper lobe, right bronchus or lung: Secondary | ICD-10-CM | POA: Diagnosis present

## 2022-06-29 DIAGNOSIS — C3490 Malignant neoplasm of unspecified part of unspecified bronchus or lung: Secondary | ICD-10-CM

## 2022-07-07 ENCOUNTER — Telehealth: Payer: Self-pay | Admitting: Physician Assistant

## 2022-07-07 NOTE — Telephone Encounter (Signed)
Called patient regarding upcoming January appointment, patient is notified.

## 2022-07-20 ENCOUNTER — Telehealth: Payer: Self-pay | Admitting: Internal Medicine

## 2022-07-20 NOTE — Telephone Encounter (Signed)
Called patient regarding January appointments, left a voicemail. 

## 2022-07-26 ENCOUNTER — Encounter (HOSPITAL_COMMUNITY): Payer: Medicare HMO

## 2022-07-26 ENCOUNTER — Encounter (HOSPITAL_COMMUNITY): Payer: Self-pay

## 2022-07-31 ENCOUNTER — Inpatient Hospital Stay: Payer: Medicare HMO | Admitting: Physician Assistant

## 2022-08-10 ENCOUNTER — Ambulatory Visit (HOSPITAL_COMMUNITY)
Admission: RE | Admit: 2022-08-10 | Discharge: 2022-08-10 | Disposition: A | Discharge status: Home / Self Care | Payer: Medicare HMO | Source: Ambulatory Visit | Attending: Physician Assistant | Admitting: Physician Assistant

## 2022-08-10 DIAGNOSIS — C3411 Malignant neoplasm of upper lobe, right bronchus or lung: Secondary | ICD-10-CM | POA: Insufficient documentation

## 2022-08-10 DIAGNOSIS — C3490 Malignant neoplasm of unspecified part of unspecified bronchus or lung: Secondary | ICD-10-CM | POA: Insufficient documentation

## 2022-08-10 LAB — GLUCOSE, CAPILLARY: Glucose-Capillary: 102 mg/dL — ABNORMAL HIGH (ref 70–99)

## 2022-08-10 MED ORDER — FLUDEOXYGLUCOSE F - 18 (FDG) INJECTION
11.1000 | Freq: Once | INTRAVENOUS | Status: AC | PRN
  Administered 2022-08-10: 11.1 via INTRAVENOUS

## 2022-08-11 NOTE — Progress Notes (Signed)
[facility] OFFICE PROGRESS NOTE  Care, [name] [address] [place] [address]  DIAGNOSIS: Stage IA (T1b, N0, M0) non-small cell lung cancer, squamous cell carcinoma presented with right upper lobe lung nodule.  Diagnosed in March 2022    PRIOR THERAPY: SBRT under the care of Dr. Mitzi Hansen completed on November 09, 2020.    CURRENT THERAPY: Observation   INTERVAL HISTORY: Luis Haney 72 y.o. male returns to the clinic today for a 75-month follow-up visit. The patient was last seen in clinic 06/29/22 months ago by Dr. Arbutus Ped and I.  The patient is being followed with surveillance imaging for stage Ia non-small cell lung cancer, squamous cell carcinoma.  The patient was initially diagnosed in March 2022 and completed SBRT to this lesion.  His restaging CT scan at that time showed post radiation changes that could be suspicious for local disease recurrence. Therefore, Dr. Arbutus Ped recommended a PET scan. Since last being seen the patient denies any changes in his health.  Denies any fever, chills, night sweats, or unexplained weight loss.  Denies any chest pain, cough, or hemoptysis.  He reports stable dyspnea on exertion. He denies any chest discomfort.  He denies any nausea, vomiting, or constipation.  He sometimes may have intermittent diarrhea with certain foods.  Denies any headache or visual changes.  The patient recently had a repeat PET scan performed.  The patient is here today for evaluation to review his scan results.   MEDICAL HISTORY: Past Medical History:  Diagnosis Date   COPD (chronic obstructive pulmonary disease) (HCC)    Dyspnea    occasuional with exertion   Lung cancer (HCC) 09/2020   Pleural effusion 06/2020   Pneumonia    Pulmonary nodule 09/2020    ALLERGIES:  has No Known Allergies.  MEDICATIONS:  Current Outpatient Medications  Medication Sig Dispense Refill   albuterol (VENTOLIN HFA) 108 (90 Base) MCG/ACT inhaler Inhale 2 puffs into the  lungs every 6 (six) hours as needed for wheezing or shortness of breath. 8 g 6   calcium carbonate (OS-CAL - DOSED IN MG OF ELEMENTAL CALCIUM) 1250 (500 Ca) MG tablet Take 1 tablet by mouth 3 (three) times daily with meals.     Multiple Vitamin (MULTI VITAMIN MENS PO) Take 1 tablet by mouth daily.     Potassium 99 MG TABS Take 99 mg by mouth daily.     TRELEGY ELLIPTA 100-62.5-25 MCG/ACT AEPB TAKE 1 PUFF BY MOUTH EVERY DAY 60 each 5   No current facility-administered medications for this visit.   Facility-Administered Medications Ordered in Other Visits  Medication Dose Route Frequency Provider Last Rate Last Admin   albuterol (PROVENTIL) (2.5 MG/3ML) 0.083% nebulizer solution 2.5 mg  2.5 mg Nebulization Once Glenford Bayley, NP        SURGICAL HISTORY:  Past Surgical History:  Procedure Laterality Date   BACK SURGERY  2000   BRONCHIAL BIOPSY  10/07/2020   Procedure: BRONCHIAL BIOPSIES;  Surgeon: Leslye Peer, MD;  Location: [facility] ENDOSCOPY;  Service: Pulmonary;;   BRONCHIAL BRUSHINGS  10/07/2020   Procedure: BRONCHIAL BRUSHINGS;  Surgeon: Leslye Peer, MD;  Location: [facility] ENDOSCOPY;  Service: Pulmonary;;   BRONCHIAL NEEDLE ASPIRATION BIOPSY  10/07/2020   Procedure: BRONCHIAL NEEDLE ASPIRATION BIOPSIES;  Surgeon: Leslye Peer, MD;  Location: [facility] ENDOSCOPY;  Service: Pulmonary;;   CHEST TUBE INSERTION Right 06/2020   CHEST TUBE INSERTION Right 10/07/2020   NECK SURGERY  2012   SUBMUCOSAL  INJECTION  10/07/2020   Procedure: FIDUCIAL DYE MARKING;  Surgeon: Leslye Peer, MD;  Location: [facility] ENDOSCOPY;  Service: Pulmonary;;   TONSILLECTOMY     removed as a chld   VIDEO BRONCHOSCOPY WITH ENDOBRONCHIAL NAVIGATION N/A 10/07/2020   Procedure: VIDEO BRONCHOSCOPY WITH ENDOBRONCHIAL NAVIGATION;  Surgeon: Leslye Peer, MD;  Location: [facility] ENDOSCOPY;  Service: Pulmonary;  Laterality: N/A;    REVIEW OF SYSTEMS:   Review of Systems  Constitutional: Negative for appetite change, chills, fatigue,  fever and unexpected weight change.  HENT: Negative for mouth sores, nosebleeds, sore throat and trouble swallowing.   Eyes: Negative for eye problems and icterus.  Respiratory: Today for dyspnea exertion.  Negative for cough, hemoptysis, and wheezing.   Cardiovascular: Negative for chest pain and leg swelling.  Gastrointestinal: Send for occasional diarrhea.  Negative for abdominal pain, constipation,  nausea and vomiting.  Genitourinary: Negative for bladder incontinence, difficulty urinating, dysuria, frequency and hematuria.   Musculoskeletal: Negative for back pain, gait problem, neck pain and neck stiffness.  Skin: Negative for itching and rash.  Neurological: Negative for dizziness, extremity weakness, gait problem, headaches, light-headedness and seizures.  Hematological: Negative for adenopathy. Does not bruise/bleed easily.  Psychiatric/Behavioral: Negative for confusion, depression and sleep disturbance. The patient is not nervous/anxious.     PHYSICAL EXAMINATION:  Blood pressure 110/70, pulse 70, temperature 98.3 F (36.8 C), temperature source Oral, resp. rate 17, weight 223 lb 6.4 oz (101.3 kg), SpO2 100 %.  ECOG PERFORMANCE STATUS: 1 - Symptomatic but completely ambulatory  Physical Exam  Constitutional: Oriented to person, place, and time and well-developed, well-nourished, and in no distress. HENT:  Head: Normocephalic and atraumatic.  Mouth/Throat: Oropharynx is clear and moist. No oropharyngeal exudate.  Eyes: Conjunctivae are normal. Right eye exhibits no discharge. Left eye exhibits no discharge. No scleral icterus.  Neck: Normal range of motion. Neck supple.  Cardiovascular: Normal rate, regular rhythm, normal heart sounds and intact distal pulses.   Pulmonary/Chest: Effort normal. Quiet breath sounds bilaterally. No respiratory distress. No wheezes. No rales.  Abdominal: Soft. Bowel sounds are normal. Exhibits no distension and no mass. There is no tenderness.   Musculoskeletal: Normal range of motion. Exhibits no edema.  Lymphadenopathy:    No cervical adenopathy.  Neurological: Alert and oriented to person, place, and time. Exhibits normal muscle tone. Gait normal. Coordination normal.  Skin: Skin is warm and dry. No rash noted. Not diaphoretic. No erythema. No pallor.  Psychiatric: Mood, memory and judgment normal.  Vitals reviewed.  LABORATORY DATA: Lab Results  Component Value Date   WBC 7.0 06/20/2022   HGB 13.5 06/20/2022   HCT 41.6 06/20/2022   MCV 97.7 06/20/2022   PLT 238 06/20/2022      Chemistry      Component Value Date/Time   NA 141 06/20/2022 1126   K 4.5 06/20/2022 1126   CL 106 06/20/2022 1126   CO2 30 06/20/2022 1126   BUN 19 06/20/2022 1126   CREATININE 1.28 (H) 06/20/2022 1126      Component Value Date/Time   CALCIUM 10.2 06/20/2022 1126   ALKPHOS 61 06/20/2022 1126   AST 12 (L) 06/20/2022 1126   ALT 7 06/20/2022 1126   BILITOT 0.7 06/20/2022 1126       RADIOGRAPHIC STUDIES:  NM PET Image Restag (PS) Skull Base To Thigh  Result Date: 08/11/2022 CLINICAL DATA:  Subsequent treatment strategy for non-small cell lung cancer. Enlarging right apical density within treatment port on recent CT. EXAM: NUCLEAR  MEDICINE PET SKULL BASE TO THIGH TECHNIQUE: 10.8 mCi F-18 FDG was injected intravenously. Full-ring PET imaging was performed from the skull base to thigh after the radiotracer. CT data was obtained and used for attenuation correction and anatomic localization. Fasting blood glucose: 102 mg/dl COMPARISON:  Chest CT 06/20/2022 and 12/20/2021.  PET-CT 08/26/2020. FINDINGS: Mediastinal blood pool activity: SUV max 1.5 NECK: No hypermetabolic cervical lymph nodes are identified.Fairly symmetric activity within the lymphoid tissue of Waldeyer's ring is within physiologic limits.No suspicious activity identified within the pharyngeal mucosal space. Incidental CT findings: none CHEST: There are no hypermetabolic  mediastinal, hilar or axillary lymph nodes. No significant residual hypermetabolic activity is seen within the treated right apical lesion which measures approximately 3.7 x 1.7 cm on image 15/2. This has an SUV max of 2.6 (previously 6.0), and despite the increasingly solid nature compared with CT 12/20/2021, most likely represents fibrosis from radiation therapy. There is no hypermetabolic activity at the left lung base to correspond with the ill-defined nodule seen on the most recent chest CT. No suspicious pulmonary metabolic activity or additional nodularity. Incidental CT findings: Atherosclerosis of the aorta, great vessels and coronary arteries. Mild centrilobular emphysema. ABDOMEN/PELVIS: There is no hypermetabolic activity within the liver, adrenal glands, spleen or pancreas. There is no hypermetabolic nodal activity in the abdomen or pelvis. Incidental CT findings: Aortic and branch vessel atherosclerosis. Mild distal colonic diverticulosis without evidence of acute inflammation. SKELETON: There is no hypermetabolic activity to suggest osseous metastatic disease. Incidental CT findings: Mild spondylosis. IMPRESSION: 1. 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 Continued CT follow-up recommended to ensure further stability. 2. No evidence of metastatic disease. 3. No hypermetabolic activity at the left lung base to correspond with the ill-defined nodule seen on the most recent chest CT. 4. Aortic Atherosclerosis (ICD10-I70.0) and Emphysema (ICD10-J43.9). Electronically Signed   By: Richardean Sale M.D.   On: 08/11/2022 10:31     ASSESSMENT/PLAN:  This is a very pleasant 72 year old Caucasian male diagnosed initially with stage Ia (T1b, N0, M0) non-small cell lung cancer, squamous cell carcinoma.  The patient presented with a right upper lobe lung nodule in March 2022.  He completed SBRT under the care of Dr.  Lisbeth Renshaw.  The patient has been on observation and feeling fine.   The patient recently had a restaging CT scan performed.  The scan showed radiation changes that could be suspicious for disease recurrence.  Therefore Dr. Julien Nordmann recommended a PET scan.  The patient was seen with Dr. Julien Nordmann today.  Dr. Julien Nordmann personally independently reviewed the PET scan results and discussed the results with the patient today.  The scan showed low level metabolic activity likely due to fibrosis from radiation. The left base lesion did not show any activity.   Dr. Julien Nordmann recommends that he continue on observation with restaging CT scan on 6 months.   I have placed the order.   The patient was advised to call immediately if he has any concerning symptoms in the interval. The patient voices understanding of current disease status and treatment options and is in agreement with the current care plan. All questions were answered. The patient knows to call the clinic with any problems, questions or concerns. We can certainly see the patient much sooner if necessary   Orders Placed This Encounter  Procedures   CT Chest W Contrast    Standing Status:   Future    Standing Expiration  Date:   08/14/2023    Order Specific Question:   If indicated for the ordered procedure, I authorize the administration of contrast media per Radiology protocol    Answer:   Yes    Order Specific Question:   Does the patient have a contrast media/X-ray dye allergy?    Answer:   No    Order Specific Question:   Preferred imaging location?    Answer:   [facility]   CBC with Differential (Cancer Center Only)    Standing Status:   Future    Standing Expiration Date:   08/15/2023   CMP (Cancer Center only)    Standing Status:   Future    Standing Expiration Date:   08/15/2023      Johnette Abraham Kareemah Grounds, PA-C 08/14/22  ADDENDUM: Hematology/Oncology Attending: I had a face-to-face encounter with the patient today.  I  reviewed his record, lab, scan and recommended his care plan.  This is a very pleasant 72 years old white male with Stage IA (T1b, N0, M0) non-small cell lung cancer, squamous cell carcinoma presented with right upper lobe lung nodule.  Diagnosed in March 2022, status post SBRT under the care of Dr. Mitzi Hansen completed on November 09, 2020.   He has been on observation since that time.  Patient had CT scan of the chest for restaging of his disease on June 20, 2022 and it showed new small pulmonary nodule in the base of the left lower lobe measuring 0.8 cm and was nonspecific but PET scan was recommended.  The patient had a PET scan performed recently to rule out any disease recurrence.  I personally and independently reviewed the scan results and discussed with the patient. His scan showed the treated right apical lesion with only low level metabolic activity consistent with fibrosis from the radiation and no other evidence of metastatic disease and no hypermetabolic activity in the left lung base corresponding to the previously seen nodule. I recommended for the patient to continue on observation with repeat CT scan of the chest in 6 months. He was advised to call immediately if he has any other concerning symptoms in the interval. Disclaimer: This note was dictated with voice recognition software. Similar sounding words can inadvertently be transcribed and may be missed upon review. [name], MD

## 2022-08-14 ENCOUNTER — Inpatient Hospital Stay: Payer: Medicare HMO | Attending: Physician Assistant | Admitting: Physician Assistant

## 2022-08-14 ENCOUNTER — Other Ambulatory Visit: Payer: Self-pay

## 2022-08-14 VITALS — BP 110/70 | HR 70 | Temp 98.3°F | Resp 17 | Wt 223.4 lb

## 2022-08-14 DIAGNOSIS — C3411 Malignant neoplasm of upper lobe, right bronchus or lung: Secondary | ICD-10-CM

## 2022-08-14 DIAGNOSIS — Z85118 Personal history of other malignant neoplasm of bronchus and lung: Secondary | ICD-10-CM | POA: Diagnosis present

## 2022-08-14 DIAGNOSIS — Z923 Personal history of irradiation: Secondary | ICD-10-CM | POA: Insufficient documentation

## 2022-08-23 ENCOUNTER — Other Ambulatory Visit: Payer: Self-pay | Admitting: Pulmonary Disease

## 2022-11-29 ENCOUNTER — Other Ambulatory Visit: Payer: Self-pay | Admitting: Pulmonary Disease

## 2023-02-12 ENCOUNTER — Encounter (HOSPITAL_COMMUNITY): Payer: Self-pay

## 2023-02-12 ENCOUNTER — Inpatient Hospital Stay: Payer: Medicare HMO | Attending: Physician Assistant

## 2023-02-12 ENCOUNTER — Other Ambulatory Visit: Payer: Self-pay

## 2023-02-12 ENCOUNTER — Ambulatory Visit (HOSPITAL_COMMUNITY)
Admission: RE | Admit: 2023-02-12 | Discharge: 2023-02-12 | Disposition: A | Discharge status: Home / Self Care | Payer: Medicare HMO | Source: Ambulatory Visit | Attending: Physician Assistant | Admitting: Physician Assistant

## 2023-02-12 DIAGNOSIS — Z08 Encounter for follow-up examination after completed treatment for malignant neoplasm: Secondary | ICD-10-CM | POA: Insufficient documentation

## 2023-02-12 DIAGNOSIS — C3411 Malignant neoplasm of upper lobe, right bronchus or lung: Secondary | ICD-10-CM | POA: Insufficient documentation

## 2023-02-12 DIAGNOSIS — Z85118 Personal history of other malignant neoplasm of bronchus and lung: Secondary | ICD-10-CM | POA: Insufficient documentation

## 2023-02-12 LAB — CMP (CANCER CENTER ONLY)
ALT: 8 U/L (ref 0–44)
AST: 13 U/L — ABNORMAL LOW (ref 15–41)
Albumin: 4 g/dL (ref 3.5–5.0)
Alkaline Phosphatase: 49 U/L (ref 38–126)
Anion gap: 5 (ref 5–15)
BUN: 20 mg/dL (ref 8–23)
CO2: 29 mmol/L (ref 22–32)
Calcium: 9.5 mg/dL (ref 8.9–10.3)
Chloride: 107 mmol/L (ref 98–111)
Creatinine: 1.12 mg/dL (ref 0.61–1.24)
GFR, Estimated: >60 mL/min (ref >60)
Glucose, Bld: 103 mg/dL — ABNORMAL HIGH (ref 70–99)
Potassium: 4.1 mmol/L (ref 3.5–5.1)
Sodium: 141 mmol/L (ref 135–145)
Total Bilirubin: 0.4 mg/dL (ref 0.3–1.2)
Total Protein: 6.5 g/dL (ref 6.5–8.1)

## 2023-02-12 LAB — CBC WITH DIFFERENTIAL (CANCER CENTER ONLY)
Abs Immature Granulocytes: 0.02 10*3/uL (ref 0.00–0.07)
Basophils Absolute: 0 10*3/uL (ref 0.0–0.1)
Basophils Relative: 1 %
Eosinophils Absolute: 0.2 10*3/uL (ref 0.0–0.5)
Eosinophils Relative: 3 %
HCT: 37.4 % — ABNORMAL LOW (ref 39.0–52.0)
Hemoglobin: 11.9 g/dL — ABNORMAL LOW (ref 13.0–17.0)
Immature Granulocytes: 0 %
Lymphocytes Relative: 22 %
Lymphs Abs: 1.3 10*3/uL (ref 0.7–4.0)
MCH: 30.8 pg (ref 26.0–34.0)
MCHC: 31.8 g/dL (ref 30.0–36.0)
MCV: 96.9 fL (ref 80.0–100.0)
Monocytes Absolute: 0.4 10*3/uL (ref 0.1–1.0)
Monocytes Relative: 7 %
Neutro Abs: 4.1 10*3/uL (ref 1.7–7.7)
Neutrophils Relative %: 67 %
Platelet Count: 224 10*3/uL (ref 150–400)
RBC: 3.86 MIL/uL — ABNORMAL LOW (ref 4.22–5.81)
RDW: 13.2 % (ref 11.5–15.5)
WBC Count: 6 10*3/uL (ref 4.0–10.5)
nRBC: 0 % (ref 0.0–0.2)

## 2023-02-12 MED ORDER — SODIUM CHLORIDE (PF) 0.9 % IJ SOLN
INTRAMUSCULAR | Status: AC
  Filled 2023-02-12: qty 50

## 2023-02-12 MED ORDER — IOHEXOL 300 MG/ML  SOLN
75.0000 mL | Freq: Once | INTRAMUSCULAR | Status: AC | PRN
  Administered 2023-02-12: 75 mL via INTRAVENOUS

## 2023-02-13 NOTE — Progress Notes (Unsigned)
[facility] OFFICE PROGRESS NOTE  Care, [name] [address] [place] [address]  DIAGNOSIS: Stage IA (T1b, N0, M0) non-small cell lung cancer, squamous cell carcinoma presented with right upper lobe lung nodule.  Diagnosed in March 2022    PRIOR THERAPY: SBRT under the care of Dr. Mitzi Hansen completed on November 09, 2020.    CURRENT THERAPY: Observation   INTERVAL HISTORY: Luis Haney 72 y.o. male returns to the clinic today for a 61-month follow-up visit. The patient was last seen in clinic on 08/14/22 by Dr. Arbutus Ped and myself.  The patient is being followed with surveillance imaging for stage Ia non-small cell lung cancer, squamous cell carcinoma.  The patient was initially diagnosed in March 2022 and completed SBRT to this lesion.  Since last being seen, the patient denies any changes in his health.  Denies any fever, chills, night sweats, or unexplained weight loss.  Denies any chest pain or hemoptysis. He may get a cough every "now and then" which produces clear phelgm but nothing out of the ordinary for him. He denies any recent URIs or viral infections. He reports stable dyspnea on exertion. He denies any chest discomfort.  He denies any nausea, vomiting, or constipation.  He sometimes may have intermittent diarrhea with certain foods which is normal for him.  Denies any headache or visual changes.  He recently had a restaging CT scan performed.  He is here today for evaluation and to review his scan results.      MEDICAL HISTORY: Past Medical History:  Diagnosis Date   COPD (chronic obstructive pulmonary disease) (HCC)    Dyspnea    occasuional with exertion   Lung cancer (HCC) 09/2020   Pleural effusion 06/2020   Pneumonia    Pulmonary nodule 09/2020    ALLERGIES:  has No Known Allergies.  MEDICATIONS:  Current Outpatient Medications  Medication Sig Dispense Refill   albuterol (VENTOLIN HFA) 108 (90 Base) MCG/ACT inhaler TAKE 2 PUFFS BY MOUTH EVERY 6  HOURS AS NEEDED FOR WHEEZE OR SHORTNESS OF BREATH 8.5 each 6   calcium carbonate (OS-CAL - DOSED IN MG OF ELEMENTAL CALCIUM) 1250 (500 Ca) MG tablet Take 1 tablet by mouth 3 (three) times daily with meals.     Multiple Vitamin (MULTI VITAMIN MENS PO) Take 1 tablet by mouth daily.     Potassium 99 MG TABS Take 99 mg by mouth daily.     TRELEGY ELLIPTA 100-62.5-25 MCG/ACT AEPB INHALE 1 PUFF BY MOUTH EVERY DAY 60 each 5   No current facility-administered medications for this visit.   Facility-Administered Medications Ordered in Other Visits  Medication Dose Route Frequency Provider Last Rate Last Admin   albuterol (PROVENTIL) (2.5 MG/3ML) 0.083% nebulizer solution 2.5 mg  2.5 mg Nebulization Once Glenford Bayley, NP        SURGICAL HISTORY:  Past Surgical History:  Procedure Laterality Date   BACK SURGERY  2000   BRONCHIAL BIOPSY  10/07/2020   Procedure: BRONCHIAL BIOPSIES;  Surgeon: Leslye Peer, MD;  Location: [facility] ENDOSCOPY;  Service: Pulmonary;;   BRONCHIAL BRUSHINGS  10/07/2020   Procedure: BRONCHIAL BRUSHINGS;  Surgeon: Leslye Peer, MD;  Location: [facility] ENDOSCOPY;  Service: Pulmonary;;   BRONCHIAL NEEDLE ASPIRATION BIOPSY  10/07/2020   Procedure: BRONCHIAL NEEDLE ASPIRATION BIOPSIES;  Surgeon: Leslye Peer, MD;  Location: [facility] ENDOSCOPY;  Service: Pulmonary;;   CHEST TUBE INSERTION Right 06/2020   CHEST TUBE INSERTION Right 10/07/2020   NECK SURGERY  2012   SUBMUCOSAL INJECTION  10/07/2020   Procedure: FIDUCIAL DYE MARKING;  Surgeon: Leslye Peer, MD;  Location: [facility] ENDOSCOPY;  Service: Pulmonary;;   TONSILLECTOMY     removed as a chld   VIDEO BRONCHOSCOPY WITH ENDOBRONCHIAL NAVIGATION N/A 10/07/2020   Procedure: VIDEO BRONCHOSCOPY WITH ENDOBRONCHIAL NAVIGATION;  Surgeon: Leslye Peer, MD;  Location: [facility] ENDOSCOPY;  Service: Pulmonary;  Laterality: N/A;    REVIEW OF SYSTEMS:   Constitutional: Negative for appetite change, chills, fatigue, fever and unexpected weight change.   HENT: Negative for mouth sores, nosebleeds, sore throat and trouble swallowing.  Eyes: Negative for eye problems and icterus.  Respiratory: Positive for dyspnea exertion and mild intermittent cough (baseline comes and goes). Negative for hemoptysis, and wheezing.   Cardiovascular: Negative for chest pain and leg swelling.  Gastrointestinal:Negative for abdominal pain, diarrhea (not currently) constipation,  nausea and vomiting.  Genitourinary: Negative for bladder incontinence, difficulty urinating, dysuria, frequency and hematuria.   Musculoskeletal: Negative for back pain, gait problem, neck pain and neck stiffness.  Skin: Negative for itching and rash.  Neurological: Negative for dizziness, extremity weakness, gait problem, headaches, light-headedness and seizures.  Hematological: Negative for adenopathy. Does not bruise/bleed easily.  Psychiatric/Behavioral: Negative for confusion, depression and sleep disturbance. The patient is not nervous/anxious.    PHYSICAL EXAMINATION:  Blood pressure 116/67, pulse (!) 54, temperature 98.2 F (36.8 C), temperature source Temporal, resp. rate 18, weight 223 lb 7 oz (101.4 kg), SpO2 100%.  ECOG PERFORMANCE STATUS: 1  Physical Exam  Constitutional: Oriented to person, place, and time and well-developed, well-nourished, and in no distress.  HENT:  Head: Normocephalic and atraumatic.  Mouth/Throat: Oropharynx is clear and moist. No oropharyngeal exudate.  Eyes: Conjunctivae are normal. Right eye exhibits no discharge. Left eye exhibits no discharge. No scleral icterus.  Neck: Normal range of motion. Neck supple.  Cardiovascular: Normal rate, regular rhythm, normal heart sounds and intact distal pulses.   Pulmonary/Chest: Effort normal. Quiet breath sounds bilaterally. No respiratory distress. No wheezes. No rales.  Abdominal: Soft. Bowel sounds are normal. Exhibits no distension and no mass. There is no tenderness.  Musculoskeletal: Normal range  of motion. Exhibits no edema.  Lymphadenopathy:    No cervical adenopathy.  Neurological: Alert and oriented to person, place, and time. Exhibits normal muscle tone. Gait normal. Coordination normal.  Skin: Skin is warm and dry. No rash noted. Not diaphoretic. No erythema. No pallor.  Psychiatric: Mood, memory and judgment normal.  Vitals reviewed.  LABORATORY DATA: Lab Results  Component Value Date   WBC 6.0 02/12/2023   HGB 11.9 (L) 02/12/2023   HCT 37.4 (L) 02/12/2023   MCV 96.9 02/12/2023   PLT 224 02/12/2023      Chemistry      Component Value Date/Time   NA 141 02/12/2023 0919   K 4.1 02/12/2023 0919   CL 107 02/12/2023 0919   CO2 29 02/12/2023 0919   BUN 20 02/12/2023 0919   CREATININE 1.12 02/12/2023 0919      Component Value Date/Time   CALCIUM 9.5 02/12/2023 0919   ALKPHOS 49 02/12/2023 0919   AST 13 (L) 02/12/2023 0919   ALT 8 02/12/2023 0919   BILITOT 0.4 02/12/2023 0919       RADIOGRAPHIC STUDIES:  CT Chest W Contrast  Result Date: 02/14/2023 CLINICAL DATA:  Non-small cell lung cancer; * Tracking Code: BO * EXAM: CT CHEST WITH CONTRAST TECHNIQUE: Multidetector CT imaging of the chest was performed during intravenous contrast administration.  RADIATION DOSE REDUCTION: This exam was performed according to the departmental dose-optimization program which includes automated exposure control, adjustment of the mA and/or kV according to patient size and/or use of iterative reconstruction technique. CONTRAST:  75mL OMNIPAQUE IOHEXOL 300 MG/ML  SOLN COMPARISON:  PET-CT dated August 10, 2022; chest CT dated June 20, 2022 FINDINGS: Cardiovascular: Normal heart size. No pericardial effusion. Normal caliber thoracic aorta moderate atherosclerotic disease. Unchanged small focal outpouching of the proximal descending thoracic aorta measuring 6 mm at the origin of a bronchial artery. Severe coronary artery calcifications. Mediastinum/Nodes: Esophagus and thyroid are  unremarkable. Low-density rounded opacity concern inferior to the right pulmonary vein, likely loculated pericardial recess fluid. No enlarged lymph nodes seen in the chest. Lungs/Pleura: Right apical consolidation appears more confluent and linear when compared with the prior exam. Mild centrilobular emphysema. New clustered solid nodules of the right lower lobe, largest measures 5 mm on series 6, image 75. New linear nodular opacity of the left lower lobe with adjacent ground-glass measuring 9 mm on series 6, image 110. Upper Abdomen: Unchanged bilateral cystic renal lesions. No acute abnormality Musculoskeletal: No chest wall abnormality. No acute or significant osseous findings. IMPRESSION: 1. Evolving right upper lobe postradiation change. 2. New clustered solid nodules of the right lower lobe and new linear nodular opacity of the left lower lobe with adjacent ground-glass measuring 9 mm, findings are likely infectious or inflammatory. Short-term follow-up chest CT is recommended in 3 months to ensure resolution. 3. Enlarged lymph nodes seen in the chest. 4. Aortic Atherosclerosis (ICD10-I70.0) and Emphysema (ICD10-J43.9). Electronically Signed   By: Allegra Lai M.D.   On: 02/14/2023 16:12     ASSESSMENT/PLAN:  This is a very pleasant 72 year old Caucasian male diagnosed initially with stage Ia (T1b, N0, M0) non-small cell lung cancer, squamous cell carcinoma.  The patient presented with a right upper lobe lung nodule in March 2022.  He completed SBRT under the care of Dr. Mitzi Hansen.  The patient has been on observation and feeling fine.   The patient recently had a restaging CT scan performed in December 2023.  The scan showed radiation changes that could be suspicious for disease recurrence. He had a PET scan in January 2024 which only showed  low level metabolic activity likely due to fibrosis from radiation. However, we will continue to monitor him closely.   Patient recently had a restaging CT  scan performed.  The patient was seen with Dr. Arbutus Ped today.  Dr. Arbutus Ped personally and independently reviewed the scan and discussed the results with the patient today.  The scan showed a cluster of small nodules new linear nodular opacity of the left lower lobe with adjacent ground-glass measuring 9 mm which is favored to be infectious or inflammatory.  Dr. Arbutus Ped recommends that he continue on observation with close monitoring. Dr. Arbutus Ped recommends follow up CT in 6 months.   Will see him back for follow-up visit approximately 1 week after his next surveillance scan to review the results in the office.  The patient was advised to call immediately if he has any concerning symptoms in the interval. The patient voices understanding of current disease status and treatment options and is in agreement with the current care plan. All questions were answered. The patient knows to call the clinic with any problems, questions or concerns. We can certainly see the patient much sooner if necessary    Orders Placed This Encounter  Procedures   CT Chest W Contrast    Standing  Status:   Future    Standing Expiration Date:   02/15/2024    Order Specific Question:   If indicated for the ordered procedure, I authorize the administration of contrast media per Radiology protocol    Answer:   Yes    Order Specific Question:   Does the patient have a contrast media/X-ray dye allergy?    Answer:   No    Order Specific Question:   Preferred imaging location?    Answer:   [facility]   CBC with Differential (Cancer Center Only)    Standing Status:   Future    Standing Expiration Date:   02/15/2024   CMP (Cancer Center only)    Standing Status:   Future    Standing Expiration Date:   02/15/2024     Luis Abraham Suede [place], PA-C 02/15/23  ADDENDUM: Hematology/Oncology Attending: I had a face-to-face encounter with the patient today.  I reviewed his record, lab, scan and recommended his care  plan.  This is a very pleasant 72 years old white male with a stage Ia non-small cell lung cancer, squamous cell carcinoma diagnosed in March 2022 status post SBRT and has been on observation since April 2022. The patient is feeling fine today with no concerning complaints except for postnasal drainage. He had repeat CT scan of the chest performed recently.  I personally and independently reviewed the scan and discussed the result with the patient today. His scan showed no concerning findings for disease progression but there was some inflammatory process in the lungs that need close monitoring. I recommended for the patient to continue on observation with repeat CT scan of the chest in 6 months. He was advised to call immediately if he has any other concerning symptoms in the interval. The total time spent in the appointment was 20 minutes. Disclaimer: This note was dictated with voice recognition software. Similar sounding words can inadvertently be transcribed and may be missed upon review. [name], MD

## 2023-02-15 ENCOUNTER — Inpatient Hospital Stay: Payer: Medicare HMO | Admitting: Physician Assistant

## 2023-02-15 ENCOUNTER — Other Ambulatory Visit: Payer: Self-pay

## 2023-02-15 VITALS — BP 116/67 | HR 54 | Temp 98.2°F | Resp 18 | Wt 223.4 lb

## 2023-02-15 DIAGNOSIS — C3411 Malignant neoplasm of upper lobe, right bronchus or lung: Secondary | ICD-10-CM | POA: Diagnosis not present

## 2023-02-15 DIAGNOSIS — Z85118 Personal history of other malignant neoplasm of bronchus and lung: Secondary | ICD-10-CM | POA: Diagnosis not present

## 2023-02-15 DIAGNOSIS — Z08 Encounter for follow-up examination after completed treatment for malignant neoplasm: Secondary | ICD-10-CM | POA: Diagnosis not present

## 2023-07-25 DIAGNOSIS — R911 Solitary pulmonary nodule: Secondary | ICD-10-CM

## 2023-07-25 HISTORY — DX: Solitary pulmonary nodule: R91.1

## 2023-08-17 ENCOUNTER — Ambulatory Visit (HOSPITAL_COMMUNITY)
Admission: RE | Admit: 2023-08-17 | Discharge: 2023-08-17 | Disposition: A | Discharge status: Home / Self Care | Payer: PPO | Source: Ambulatory Visit | Attending: Physician Assistant | Admitting: Physician Assistant

## 2023-08-17 ENCOUNTER — Inpatient Hospital Stay: Payer: PPO | Attending: Internal Medicine

## 2023-08-17 DIAGNOSIS — C3411 Malignant neoplasm of upper lobe, right bronchus or lung: Secondary | ICD-10-CM | POA: Insufficient documentation

## 2023-08-17 LAB — CMP (CANCER CENTER ONLY)
ALT: 9 U/L (ref 0–44)
AST: 14 U/L — ABNORMAL LOW (ref 15–41)
Albumin: 4.2 g/dL (ref 3.5–5.0)
Alkaline Phosphatase: 63 U/L (ref 38–126)
Anion gap: 6 (ref 5–15)
BUN: 20 mg/dL (ref 8–23)
CO2: 30 mmol/L (ref 22–32)
Calcium: 10.2 mg/dL (ref 8.9–10.3)
Chloride: 104 mmol/L (ref 98–111)
Creatinine: 1.28 mg/dL — ABNORMAL HIGH (ref 0.61–1.24)
GFR, Estimated: 59 mL/min — ABNORMAL LOW (ref >60)
Glucose, Bld: 103 mg/dL — ABNORMAL HIGH (ref 70–99)
Potassium: 4.7 mmol/L (ref 3.5–5.1)
Sodium: 140 mmol/L (ref 135–145)
Total Bilirubin: 0.4 mg/dL (ref 0.0–1.2)
Total Protein: 7.2 g/dL (ref 6.5–8.1)

## 2023-08-17 LAB — CBC WITH DIFFERENTIAL (CANCER CENTER ONLY)
Abs Immature Granulocytes: 0.02 K/uL (ref 0.00–0.07)
Basophils Absolute: 0.1 K/uL (ref 0.0–0.1)
Basophils Relative: 1 %
Eosinophils Absolute: 0.2 K/uL (ref 0.0–0.5)
Eosinophils Relative: 3 %
HCT: 42 % (ref 39.0–52.0)
Hemoglobin: 13.3 g/dL (ref 13.0–17.0)
Immature Granulocytes: 0 %
Lymphocytes Relative: 24 %
Lymphs Abs: 1.8 K/uL (ref 0.7–4.0)
MCH: 30.9 pg (ref 26.0–34.0)
MCHC: 31.7 g/dL (ref 30.0–36.0)
MCV: 97.4 fL (ref 80.0–100.0)
Monocytes Absolute: 0.6 K/uL (ref 0.1–1.0)
Monocytes Relative: 7 %
Neutro Abs: 4.8 K/uL (ref 1.7–7.7)
Neutrophils Relative %: 65 %
Platelet Count: 243 K/uL (ref 150–400)
RBC: 4.31 MIL/uL (ref 4.22–5.81)
RDW: 13.2 % (ref 11.5–15.5)
WBC Count: 7.4 K/uL (ref 4.0–10.5)
nRBC: 0 % (ref 0.0–0.2)

## 2023-08-17 MED ORDER — IOHEXOL 300 MG/ML  SOLN
75.0000 mL | Freq: Once | INTRAMUSCULAR | Status: AC | PRN
  Administered 2023-08-17: 75 mL via INTRAVENOUS

## 2023-08-22 ENCOUNTER — Inpatient Hospital Stay (HOSPITAL_BASED_OUTPATIENT_CLINIC_OR_DEPARTMENT_OTHER): Payer: PPO | Admitting: Internal Medicine

## 2023-08-22 VITALS — BP 124/70 | HR 60 | Temp 98.0°F | Resp 17 | Ht 73.0 in | Wt 228.9 lb

## 2023-08-22 DIAGNOSIS — C3490 Malignant neoplasm of unspecified part of unspecified bronchus or lung: Secondary | ICD-10-CM

## 2023-08-22 DIAGNOSIS — C3411 Malignant neoplasm of upper lobe, right bronchus or lung: Secondary | ICD-10-CM | POA: Diagnosis not present

## 2023-08-22 NOTE — Progress Notes (Signed)
[facility] [phone]   [phone]  OFFICE PROGRESS NOTE  Care, [name] [address] [place] [address]  DIAGNOSIS: Stage IA (T1b, N0, M0) non-small cell lung cancer, squamous cell carcinoma presented with right upper lobe lung nodule.  Diagnosed in March 2022   PRIOR THERAPY: SBRT under the care of Dr. Mitzi Hansen completed on November 09, 2020.   CURRENT THERAPY: Observation  INTERVAL HISTORY: Luis Haney 73 y.o. male returns to the clinic today for follow-up visit accompanied by his wife.  Discussed the use of AI scribe software for clinical note transcription with the patient, who gave verbal consent to proceed.  History of Present Illness   The patient, with COPD and a history of lung cancer, presents for follow-up regarding a lung nodule and COPD management.  A recent chest scan identified a 6 mm nodule in the left upper lobe, distinct from his previous cancer location in the right upper lobe. A follow-up scan is scheduled in four months. No recent weight loss, night sweats, nausea, vomiting, or diarrhea have been noted, but there has been weight gain.  He is managing COPD with Trelegy but has run out of the medication. He experiences phlegm production, attributed to sinus issues, and fatigue after exertion. He is in the process of establishing care with a new family doctor due to insurance changes.  He mentions occasional shoulder pain, which he suspects may be related to COPD. A previous doctor suggested the possibility of rotator cuff surgery.       MEDICAL HISTORY: Past Medical History:  Diagnosis Date   COPD (chronic obstructive pulmonary disease) (HCC)    Dyspnea    occasuional with exertion   Lung cancer (HCC) 09/2020   Pleural effusion 06/2020   Pneumonia    Pulmonary nodule 09/2020    ALLERGIES:  has no known allergies.  MEDICATIONS:  Current Outpatient Medications  Medication Sig Dispense Refill   albuterol  (VENTOLIN HFA) 108 (90 Base) MCG/ACT inhaler TAKE 2 PUFFS BY MOUTH EVERY 6 HOURS AS NEEDED FOR WHEEZE OR SHORTNESS OF BREATH 8.5 each 6   calcium carbonate (OS-CAL - DOSED IN MG OF ELEMENTAL CALCIUM) 1250 (500 Ca) MG tablet Take 1 tablet by mouth 3 (three) times daily with meals.     Multiple Vitamin (MULTI VITAMIN MENS PO) Take 1 tablet by mouth daily.     Potassium 99 MG TABS Take 99 mg by mouth daily.     TRELEGY ELLIPTA 100-62.5-25 MCG/ACT AEPB INHALE 1 PUFF BY MOUTH EVERY DAY 60 each 5   No current facility-administered medications for this visit.   Facility-Administered Medications Ordered in Other Visits  Medication Dose Route Frequency Provider Last Rate Last Admin   albuterol (PROVENTIL) (2.5 MG/3ML) 0.083% nebulizer solution 2.5 mg  2.5 mg Nebulization Once Glenford Bayley, NP        SURGICAL HISTORY:  Past Surgical History:  Procedure Laterality Date   BACK SURGERY  2000   BRONCHIAL BIOPSY  10/07/2020   Procedure: BRONCHIAL BIOPSIES;  Surgeon: Leslye Peer, MD;  Location: [facility] ENDOSCOPY;  Service: Pulmonary;;   BRONCHIAL BRUSHINGS  10/07/2020   Procedure: BRONCHIAL BRUSHINGS;  Surgeon: Leslye Peer, MD;  Location: [facility] ENDOSCOPY;  Service: Pulmonary;;   BRONCHIAL NEEDLE ASPIRATION BIOPSY  10/07/2020   Procedure: BRONCHIAL NEEDLE ASPIRATION BIOPSIES;  Surgeon: Leslye Peer, MD;  Location: [facility] ENDOSCOPY;  Service: Pulmonary;;   CHEST TUBE INSERTION Right 06/2020   CHEST  TUBE INSERTION Right 10/07/2020   NECK SURGERY  2012   SUBMUCOSAL INJECTION  10/07/2020   Procedure: FIDUCIAL DYE MARKING;  Surgeon: Leslye Peer, MD;  Location: [facility] ENDOSCOPY;  Service: Pulmonary;;   TONSILLECTOMY     removed as a chld   VIDEO BRONCHOSCOPY WITH ENDOBRONCHIAL NAVIGATION N/A 10/07/2020   Procedure: VIDEO BRONCHOSCOPY WITH ENDOBRONCHIAL NAVIGATION;  Surgeon: Leslye Peer, MD;  Location: [facility] ENDOSCOPY;  Service: Pulmonary;  Laterality: N/A;    REVIEW OF SYSTEMS:  A comprehensive review of  systems was negative except for: Respiratory: positive for cough and dyspnea on exertion   PHYSICAL EXAMINATION: General appearance: alert, cooperative, and no distress Head: Normocephalic, without obvious abnormality, atraumatic Neck: no adenopathy, no JVD, supple, symmetrical, trachea midline, and thyroid not enlarged, symmetric, no tenderness/mass/nodules Lymph nodes: Cervical, supraclavicular, and axillary nodes normal. Resp: clear to auscultation bilaterally Back: symmetric, no curvature. ROM normal. No CVA tenderness. Cardio: regular rate and rhythm, S1, S2 normal, no murmur, click, rub or gallop GI: soft, non-tender; bowel sounds normal; no masses,  no organomegaly Extremities: extremities normal, atraumatic, no cyanosis or edema  ECOG PERFORMANCE STATUS: 1 - Symptomatic but completely ambulatory  Blood pressure 124/70, pulse 60, temperature 98 F (36.7 C), temperature source Temporal, resp. rate 17, height 6\' 1"  (1.854 m), weight 228 lb 14.4 oz (103.8 kg), SpO2 98%.  LABORATORY DATA: Lab Results  Component Value Date   WBC 7.4 08/17/2023   HGB 13.3 08/17/2023   HCT 42.0 08/17/2023   MCV 97.4 08/17/2023   PLT 243 08/17/2023      Chemistry      Component Value Date/Time   NA 140 08/17/2023 0932   K 4.7 08/17/2023 0932   CL 104 08/17/2023 0932   CO2 30 08/17/2023 0932   BUN 20 08/17/2023 0932   CREATININE 1.28 (H) 08/17/2023 0932      Component Value Date/Time   CALCIUM 10.2 08/17/2023 0932   ALKPHOS 63 08/17/2023 0932   AST 14 (L) 08/17/2023 0932   ALT 9 08/17/2023 0932   BILITOT 0.4 08/17/2023 0932       RADIOGRAPHIC STUDIES: CT Chest W Contrast Result Date: 08/21/2023 CLINICAL DATA:  Restaging non-small cell lung cancer. * Tracking Code: BO * EXAM: CT CHEST WITH CONTRAST TECHNIQUE: Multidetector CT imaging of the chest was performed during intravenous contrast administration. RADIATION DOSE REDUCTION: This exam was performed according to the departmental  dose-optimization program which includes automated exposure control, adjustment of the mA and/or kV according to patient size and/or use of iterative reconstruction technique. CONTRAST:  75mL OMNIPAQUE IOHEXOL 300 MG/ML  SOLN COMPARISON:  CT scan 02/12/2023 FINDINGS: Cardiovascular: The heart is normal in size. No pericardial effusion. Stable tortuosity and calcification of the thoracic aorta. No aneurysm or dissection. Stable extensive three-vessel coronary artery calcifications. Mediastinum/Nodes: Stable scattered mediastinal and hilar lymph nodes but no mass or overt adenopathy. The esophagus is unremarkable. Lungs/Pleura: Stable area of dense airspace opacity in the posteromedial aspect of the right upper lobe adjacent to the spine. This measures 4.5 x 2.8 cm and is unchanged. This is consistent with post treatment changes with dense radiation fibrosis. No findings suspicious for residual or recurrent tumor. 6 mm left upper lobe nodule on image number 38/6 is new or perhaps present but much smaller on the prior chest CT. Recommend close follow-up. Repeat chest CT in 3-4 months is suggested. No other pulmonary lesions or pulmonary nodules are identified. No acute pulmonary process. No pleural effusions or pleural lesions.  Stable linear scarring changes at both lung bases. Upper Abdomen: No significant upper abdominal findings. No hepatic or adrenal gland lesions. Simple right renal cyst not requiring any further imaging evaluation or follow-up. No upper abdominal adenopathy. Musculoskeletal: No chest wall mass, supraclavicular or axillary adenopathy. The bony thorax is intact. No worrisome lytic or sclerotic bone lesions. Cervical fusion hardware noted along with exaggerated thoracic kyphosis. IMPRESSION: 1. Stable area of dense airspace opacity in the posteromedial aspect of the right upper lobe adjacent to the spine consistent with post treatment changes with dense radiation fibrosis. No findings suspicious for  residual or recurrent tumor. 2. 6 mm left upper lobe nodule is new or larger when compared to the prior chest CT. Recommend close follow-up. Repeat chest CT in 3-4 months is suggested. 3. No thoracic adenopathy. 4. Stable extensive three-vessel coronary artery calcifications. 5. Aortic atherosclerosis. Aortic Atherosclerosis (ICD10-I70.0). Electronically Signed   By: Rudie Meyer M.D.   On: 08/21/2023 18:06   ASSESSMENT AND PLAN: This is a very pleasant 73 years old white male diagnosed with a stage Ia (T1b, N0, M0) non-small cell lung cancer, squamous cell carcinoma presented with right upper lobe lung nodule diagnosed in March 2022 status post SBRT under the care of Dr. Mitzi Hansen.  The patient is currently on observation. He is feeling fine today with no concerning complaints except for mild cough and shortness of breath with exertion. He had repeat CT scan of the chest performed recently.  I personally and independently reviewed the scan images and discussed the result with the patient today. Assessment and Plan    Stage IA (T1b, N0, M0) non-small cell lung cancer, squamous cell carcinoma presented with right upper lobe lung nodule.  Diagnosed in March 2022, S/P SBRT. Pulmonary Nodule A 6mm nodule in the left upper lobe was identified on a recent chest scan. Differential diagnosis includes inflammation or malignancy. Previous cancer was in the right upper lobe, indicating this is a new finding. Monitoring with a follow-up scan in four months is planned to assess resolution or change in size. - Order follow-up chest scan in four months - Review scan results to determine if further workup is needed  Chronic Obstructive Pulmonary Disease (COPD) Reports ongoing symptoms of phlegm production and shoulder pain, attributed to COPD. Currently out of Trelegy and needs a refill. Establishing care with a new family doctor and pulmonologist due to an insurance change. - Refill Trelegy by his pulmonologist or  PCP. - Advise to contact new family doctor for medication refills until care is established with a new pulmonologist - Schedule follow-up with a new pulmonologist  Rotator Cuff Injury Reports shoulder pain and has been advised to undergo rotator cuff surgery. - Refer to orthopedic specialist for evaluation and potential rotator cuff surgery  General Health Maintenance Has gained weight and denies recent weight loss, night sweats, nausea, vomiting, or diarrhea. Establishing care with a new family doctor. - Encourage establishing care with a new family doctor for ongoing health maintenance and management of chronic conditions  Follow-up - Follow-up in four months - Order lab work and scan one week before the next appointment.  The patient was advised to call immediately if he has any concerning symptoms in the interval.   The patient voices understanding of current disease status and treatment options and is in agreement with the current care plan.  All questions were answered. The patient knows to call the clinic with any problems, questions or concerns. We can certainly see the  patient much sooner if necessary.  The total time spent in the appointment was 20 minutes.  Disclaimer: This note was dictated with voice recognition software. Similar sounding words can inadvertently be transcribed and may not be corrected upon review.

## 2023-09-03 ENCOUNTER — Other Ambulatory Visit: Payer: Self-pay | Admitting: Pulmonary Disease

## 2023-12-05 ENCOUNTER — Ambulatory Visit
Admission: EM | Admit: 2023-12-05 | Discharge: 2023-12-05 | Disposition: A | Discharge status: Home / Self Care | Attending: Family Medicine | Admitting: Family Medicine

## 2023-12-05 DIAGNOSIS — T161XXA Foreign body in right ear, initial encounter: Secondary | ICD-10-CM | POA: Diagnosis not present

## 2023-12-05 NOTE — ED Provider Notes (Addendum)
 [facility]    CSN: [id number] Arrival date & time: 12/05/23  1041      History   Chief Complaint Chief Complaint  Patient presents with   Foreign Body in Ear    HPI Luis Haney is a 73 y.o. male.   HPI   Luis Haney presents for right ear discomfort that started this morning.  His wife tried to remove the ear piece but may have pushed it down further. He wears hearing aids and believes the tip is still in his ear.  [facility] called [facility] but they were not able to schedule an appointment for him to get it out and was told to come to the urgent care.    Past Medical History:  Diagnosis Date   COPD (chronic obstructive pulmonary disease) (HCC)    Dyspnea    occasuional with exertion   Lung cancer (HCC) 09/2020   Pleural effusion 06/2020   Pneumonia    Pulmonary nodule 09/2020    Patient Active Problem List   Diagnosis Date Noted   Malignant neoplasm of upper lobe of right lung (HCC) 10/19/2020   Lung nodule 10/07/2020   Other emphysema (HCC) 07/29/2020   Malnutrition of moderate degree 07/09/2020   Empyema lung (HCC) 06/29/2020   Acute hypoxemic respiratory failure (HCC) 06/28/2020   Pleural effusion, right 06/28/2020   Right upper lobe pulmonary nodule 06/28/2020   Tobacco use disorder, moderate, dependence 06/28/2020   Neutrophilic leukocytosis 06/28/2020   Lobar pneumonia (HCC) 06/28/2020    Past Surgical History:  Procedure Laterality Date   BACK SURGERY  2000   BRONCHIAL BIOPSY  10/07/2020   Procedure: BRONCHIAL BIOPSIES;  Surgeon: Denson Flake, MD;  Location: [facility] ENDOSCOPY;  Service: Pulmonary;;   BRONCHIAL BRUSHINGS  10/07/2020   Procedure: BRONCHIAL BRUSHINGS;  Surgeon: Denson Flake, MD;  Location: [facility] ENDOSCOPY;  Service: Pulmonary;;   BRONCHIAL NEEDLE ASPIRATION BIOPSY  10/07/2020   Procedure: BRONCHIAL NEEDLE ASPIRATION BIOPSIES;  Surgeon: Denson Flake, MD;  Location: [facility] ENDOSCOPY;  Service: Pulmonary;;   CHEST TUBE INSERTION Right  06/2020   CHEST TUBE INSERTION Right 10/07/2020   NECK SURGERY  2012   SUBMUCOSAL INJECTION  10/07/2020   Procedure: FIDUCIAL DYE MARKING;  Surgeon: Denson Flake, MD;  Location: [facility] ENDOSCOPY;  Service: Pulmonary;;   TONSILLECTOMY     removed as a chld   VIDEO BRONCHOSCOPY WITH ENDOBRONCHIAL NAVIGATION N/A 10/07/2020   Procedure: VIDEO BRONCHOSCOPY WITH ENDOBRONCHIAL NAVIGATION;  Surgeon: Denson Flake, MD;  Location: [facility] ENDOSCOPY;  Service: Pulmonary;  Laterality: N/A;       Home Medications    Prior to Admission medications   Medication Sig Start Date End Date Taking? Authorizing Provider  albuterol  (VENTOLIN  HFA) 108 (90 Base) MCG/ACT inhaler TAKE 2 PUFFS BY MOUTH EVERY 6 HOURS AS NEEDED FOR WHEEZE OR SHORTNESS OF BREATH 08/24/22  Yes Icard, Bradley L, DO  atorvastatin (LIPITOR) 20 MG tablet Take 20 mg by mouth daily.   Yes [provider]  calcium  carbonate (OS-CAL - DOSED IN MG OF ELEMENTAL CALCIUM ) 1250 (500 Ca) MG tablet Take 1 tablet by mouth 3 (three) times daily with meals.   Yes [provider]  Multiple Vitamin (MULTI VITAMIN MENS PO) Take 1 tablet by mouth daily.   Yes [provider]  Potassium 99 MG TABS Take 99 mg by mouth daily.   Yes [provider]  TRELEGY ELLIPTA  100-62.5-25 MCG/ACT AEPB INHALE 1 PUFF BY MOUTH EVERY DAY 11/30/22  Yes Icard, Lucie Ruts, DO    Family History Family History  Problem Relation Age of Onset   Heart disease Mother    Other Father        "old age"    Social History Social History   Tobacco Use   Smoking status: Former    Current packs/day: 0.00    Average packs/day: 2.0 packs/day for 30.0 years (60.0 ttl pk-yrs)    Types: Cigarettes    Start date: 06/1990    Quit date: 06/2020    Years since quitting: 3.4   Smokeless tobacco: Never  Vaping Use   Vaping status: Never Used  Substance Use Topics   Alcohol use: Yes    Comment: beer occasionally   Drug use: Not Currently     Allergies    Patient has no known allergies.   Review of Systems Review of Systems: :negative unless otherwise stated in HPI.      Physical Exam Triage Vital Signs ED Triage Vitals  Encounter Vitals Group     BP 12/05/23 1057 (!) 149/73     Systolic BP Percentile --      Diastolic BP Percentile --      Pulse Rate 12/05/23 1057 (!) 51     Resp 12/05/23 1057 16     Temp 12/05/23 1057 98.4 F (36.9 C)     Temp Source 12/05/23 1057 Oral     SpO2 12/05/23 1057 95 %     Weight 12/05/23 1056 228 lb (103.4 kg)     Height 12/05/23 1056 6\' 1"  (1.854 m)     Head Circumference --      Peak Flow --      Pain Score 12/05/23 1100 0     Pain Loc --      Pain Education --      Exclude from Growth Chart --    No data found.  Updated Vital Signs BP (!) 149/73 (BP Location: Left Arm)   Pulse (!) 52   Temp 98.4 F (36.9 C) (Oral)   Resp 16   Ht 6\' 1"  (1.854 m)   Wt 103.4 kg   SpO2 96%   BMI 30.08 kg/m   Visual Acuity Right Eye Distance:   Left Eye Distance:   Bilateral Distance:    Right Eye Near:   Left Eye Near:    Bilateral Near:     Physical Exam GEN:     alert, non-toxic appearing male in no distress    HENT:  mucus membranes moist, no nasal discharge, right TM not visible due to dark colored foreign body in ear canal, non-tender tragus EYES:   no scleral injection or injection  RESP:  no increased work of breathing Skin:   warm and dry    UC Treatments / Results  Labs (all labs ordered are listed, but only abnormal results are displayed) Labs Reviewed - No data to display  EKG   Radiology No results found.  Procedures Foreign Body Removal  Date/Time: 12/05/2023 11:41 AM  Performed by: Keirstin Musil, DO Authorized by: Monai Hindes, DO   Consent:    Consent obtained:  Verbal   Consent given by:  Patient   Risks, benefits, and alternatives were discussed: yes   Universal protocol:    Patient identity confirmed:  Verbally with patient Location:     Location:  Ear   Ear location:  R ear Pre-procedure details:    Imaging:  None Anesthesia:    Anesthesia method:  None Procedure type:    Procedure complexity:  Simple Procedure details:    Localization method:  Visualized   Foreign bodies recovered:  1   Description:  Black hearing aid tip   Intact foreign body removal: yes   Post-procedure details:    Confirmation:  No additional foreign bodies on visualization   Skin closure:  None   Procedure completion:  Tolerated  (including critical care time)  Medications Ordered in UC Medications - No data to display  Initial Impression / Assessment and Plan / UC Course  I have reviewed the triage vital signs and the nursing notes.  Pertinent labs & imaging results that were available during my care of the patient were reviewed by me and considered in my medical decision making (see chart for details).     Pt is a 73 year old male who presents for a discomfort after the tip of his ear hearing aid came off into his right ear this morning. Overall patient is well-appearing, well-hydrated and without respiratory distress. Erik is afebrile.  Recommended removal and he is agreeable.  Hearing aid tip removed from patient's ear.  Patient tolerated procedure well.  He will [facility]-up with [facility] to get a replacement if desired.  Bradycardia is not new.  Discussed MDM, treatment plan and plan for follow-up with patient who agrees with plan.   Final Clinical Impressions(s) / UC Diagnoses   Final diagnoses:  Foreign body of right ear, initial encounter     Discharge Instructions      Follow up with [facility] for ear tip replacement    ED Prescriptions   None    PDMP not reviewed this encounter.       Fidel Huddle, DO 12/05/23 1146

## 2023-12-05 NOTE — Discharge Instructions (Addendum)
 Follow up with [facility] for ear tip replacement

## 2023-12-05 NOTE — ED Triage Notes (Signed)
 Pt c/o hearing aid piece in R ear x1 day.

## 2023-12-11 ENCOUNTER — Inpatient Hospital Stay: Payer: PPO | Attending: Internal Medicine

## 2023-12-11 ENCOUNTER — Ambulatory Visit (HOSPITAL_COMMUNITY)
Admission: RE | Admit: 2023-12-11 | Discharge: 2023-12-11 | Disposition: A | Discharge status: Home / Self Care | Payer: PPO | Source: Ambulatory Visit | Attending: Internal Medicine | Admitting: Internal Medicine

## 2023-12-11 DIAGNOSIS — C3490 Malignant neoplasm of unspecified part of unspecified bronchus or lung: Secondary | ICD-10-CM | POA: Insufficient documentation

## 2023-12-11 DIAGNOSIS — Z85118 Personal history of other malignant neoplasm of bronchus and lung: Secondary | ICD-10-CM | POA: Diagnosis present

## 2023-12-11 DIAGNOSIS — Z923 Personal history of irradiation: Secondary | ICD-10-CM | POA: Diagnosis not present

## 2023-12-11 LAB — CBC WITH DIFFERENTIAL (CANCER CENTER ONLY)
Abs Immature Granulocytes: 0.02 10*3/uL (ref 0.00–0.07)
Basophils Absolute: 0.1 10*3/uL (ref 0.0–0.1)
Basophils Relative: 1 %
Eosinophils Absolute: 0.2 10*3/uL (ref 0.0–0.5)
Eosinophils Relative: 2 %
HCT: 37.6 % — ABNORMAL LOW (ref 39.0–52.0)
Hemoglobin: 12.1 g/dL — ABNORMAL LOW (ref 13.0–17.0)
Immature Granulocytes: 0 %
Lymphocytes Relative: 17 %
Lymphs Abs: 1.4 10*3/uL (ref 0.7–4.0)
MCH: 31 pg (ref 26.0–34.0)
MCHC: 32.2 g/dL (ref 30.0–36.0)
MCV: 96.4 fL (ref 80.0–100.0)
Monocytes Absolute: 0.5 10*3/uL (ref 0.1–1.0)
Monocytes Relative: 7 %
Neutro Abs: 6 10*3/uL (ref 1.7–7.7)
Neutrophils Relative %: 73 %
Platelet Count: 226 10*3/uL (ref 150–400)
RBC: 3.9 MIL/uL — ABNORMAL LOW (ref 4.22–5.81)
RDW: 13.6 % (ref 11.5–15.5)
WBC Count: 8.2 10*3/uL (ref 4.0–10.5)
nRBC: 0 % (ref 0.0–0.2)

## 2023-12-11 LAB — CMP (CANCER CENTER ONLY)
ALT: 13 U/L (ref 0–44)
AST: 16 U/L (ref 15–41)
Albumin: 4.2 g/dL (ref 3.5–5.0)
Alkaline Phosphatase: 58 U/L (ref 38–126)
Anion gap: 4 — ABNORMAL LOW (ref 5–15)
BUN: 19 mg/dL (ref 8–23)
CO2: 29 mmol/L (ref 22–32)
Calcium: 9.4 mg/dL (ref 8.9–10.3)
Chloride: 107 mmol/L (ref 98–111)
Creatinine: 1.25 mg/dL — ABNORMAL HIGH (ref 0.61–1.24)
GFR, Estimated: >60 mL/min (ref >60)
Glucose, Bld: 104 mg/dL — ABNORMAL HIGH (ref 70–99)
Potassium: 4.7 mmol/L (ref 3.5–5.1)
Sodium: 140 mmol/L (ref 135–145)
Total Bilirubin: 0.5 mg/dL (ref 0.0–1.2)
Total Protein: 6.9 g/dL (ref 6.5–8.1)

## 2023-12-11 MED ORDER — IOHEXOL 300 MG/ML  SOLN
75.0000 mL | Freq: Once | INTRAMUSCULAR | Status: AC | PRN
  Administered 2023-12-11: 75 mL via INTRAVENOUS

## 2023-12-18 ENCOUNTER — Inpatient Hospital Stay: Payer: PPO | Admitting: Internal Medicine

## 2023-12-18 VITALS — BP 123/62 | HR 76 | Temp 98.3°F | Resp 18 | Ht 73.0 in | Wt 228.0 lb

## 2023-12-18 DIAGNOSIS — C3490 Malignant neoplasm of unspecified part of unspecified bronchus or lung: Secondary | ICD-10-CM | POA: Diagnosis not present

## 2023-12-18 DIAGNOSIS — Z85118 Personal history of other malignant neoplasm of bronchus and lung: Secondary | ICD-10-CM | POA: Diagnosis not present

## 2023-12-18 NOTE — Progress Notes (Signed)
 [facility] [phone]   Fax:([phone]  OFFICE PROGRESS NOTE  Care, [name] [address] [place] [address]  DIAGNOSIS: Stage IA (T1b, N0, M0) non-small cell lung cancer, squamous cell carcinoma presented with right upper lobe lung nodule.  Diagnosed in March 2022   PRIOR THERAPY: SBRT under the care of Dr. Jeryl Moris completed on November 09, 2020.   CURRENT THERAPY: Observation  INTERVAL HISTORY: Luis Haney 73 y.o. male returns to the clinic today for follow-up visit accompanied by his wife. Discussed the use of AI scribe software for clinical note transcription with the patient, who gave verbal consent to proceed.  History of Present Illness   Luis Haney is a 73 year old male with stage one A non-small cell lung cancer who presents for evaluation with repeat CT scan for restaging of his disease. He is accompanied by his girlfriend.  He has a history of stage one A non-small cell lung cancer, squamous cell carcinoma, diagnosed in March 2022. He underwent SBRT and has been on observation since April 2022. A recent CT scan shows an increase in the size of a nodule in the left upper lobe of his lung, now measuring 9 by 7 millimeters compared to 6 millimeters a few months ago.  He has a nagging cough that has persisted for three weeks. Additionally, he experiences shortness of breath and uses a cane to assist with walking. He does not use oxygen at home. He has difficulty with physical exertion, such as taking out the trash, which results in a hard time getting back inside.  He has a history of COPD and receives inhalers prescribed by his regular family doctor.        MEDICAL HISTORY: Past Medical History:  Diagnosis Date   COPD (chronic obstructive pulmonary disease) (HCC)    Dyspnea    occasuional with exertion   Lung cancer (HCC) 09/2020   Pleural effusion 06/2020   Pneumonia    Pulmonary nodule 09/2020    ALLERGIES:  has  no known allergies.  MEDICATIONS:  Current Outpatient Medications  Medication Sig Dispense Refill   albuterol  (VENTOLIN  HFA) 108 (90 Base) MCG/ACT inhaler TAKE 2 PUFFS BY MOUTH EVERY 6 HOURS AS NEEDED FOR WHEEZE OR SHORTNESS OF BREATH 8.5 each 6   atorvastatin (LIPITOR) 20 MG tablet Take 20 mg by mouth daily.     calcium  carbonate (OS-CAL - DOSED IN MG OF ELEMENTAL CALCIUM ) 1250 (500 Ca) MG tablet Take 1 tablet by mouth 3 (three) times daily with meals.     Multiple Vitamin (MULTI VITAMIN MENS PO) Take 1 tablet by mouth daily.     Potassium 99 MG TABS Take 99 mg by mouth daily.     TRELEGY ELLIPTA  100-62.5-25 MCG/ACT AEPB INHALE 1 PUFF BY MOUTH EVERY DAY 60 each 5   No current facility-administered medications for this visit.   Facility-Administered Medications Ordered in Other Visits  Medication Dose Route Frequency Provider Last Rate Last Admin   albuterol  (PROVENTIL ) (2.5 MG/3ML) 0.083% nebulizer solution 2.5 mg  2.5 mg Nebulization Once Antonio Baumgarten, NP        SURGICAL HISTORY:  Past Surgical History:  Procedure Laterality Date   BACK SURGERY  2000   BRONCHIAL BIOPSY  10/07/2020   Procedure: BRONCHIAL BIOPSIES;  Surgeon: Denson Flake, MD;  Location: [facility] ENDOSCOPY;  Service: Pulmonary;;   BRONCHIAL BRUSHINGS  10/07/2020   Procedure: BRONCHIAL BRUSHINGS;  Surgeon: Racheal Buddle  S, MD;  Location: [facility] ENDOSCOPY;  Service: Pulmonary;;   BRONCHIAL NEEDLE ASPIRATION BIOPSY  10/07/2020   Procedure: BRONCHIAL NEEDLE ASPIRATION BIOPSIES;  Surgeon: Denson Flake, MD;  Location: [facility] ENDOSCOPY;  Service: Pulmonary;;   CHEST TUBE INSERTION Right 06/2020   CHEST TUBE INSERTION Right 10/07/2020   NECK SURGERY  2012   SUBMUCOSAL INJECTION  10/07/2020   Procedure: FIDUCIAL DYE MARKING;  Surgeon: Denson Flake, MD;  Location: [facility] ENDOSCOPY;  Service: Pulmonary;;   TONSILLECTOMY     removed as a chld   VIDEO BRONCHOSCOPY WITH ENDOBRONCHIAL NAVIGATION N/A 10/07/2020   Procedure: VIDEO  BRONCHOSCOPY WITH ENDOBRONCHIAL NAVIGATION;  Surgeon: Denson Flake, MD;  Location: [facility] ENDOSCOPY;  Service: Pulmonary;  Laterality: N/A;    REVIEW OF SYSTEMS:  Constitutional: negative Eyes: negative Ears, nose, mouth, throat, and face: negative Respiratory: positive for dyspnea on exertion Cardiovascular: negative Gastrointestinal: negative Genitourinary:negative Integument/breast: negative Hematologic/lymphatic: negative Musculoskeletal:negative Neurological: negative Behavioral/Psych: negative Endocrine: negative Allergic/Immunologic: negative   PHYSICAL EXAMINATION: General appearance: alert, cooperative, and no distress Head: Normocephalic, without obvious abnormality, atraumatic Neck: no adenopathy, no JVD, supple, symmetrical, trachea midline, and thyroid not enlarged, symmetric, no tenderness/mass/nodules Lymph nodes: Cervical, supraclavicular, and axillary nodes normal. Resp: clear to auscultation bilaterally Back: symmetric, no curvature. ROM normal. No CVA tenderness. Cardio: regular rate and rhythm, S1, S2 normal, no murmur, click, rub or gallop GI: soft, non-tender; bowel sounds normal; no masses,  no organomegaly Extremities: extremities normal, atraumatic, no cyanosis or edema Neurologic: Alert and oriented X 3, normal strength and tone. Normal symmetric reflexes. Normal coordination and gait  ECOG PERFORMANCE STATUS: 1 - Symptomatic but completely ambulatory  Blood pressure 123/62, pulse 76, temperature 98.3 F (36.8 C), temperature source Temporal, resp. rate 18, height 6\' 1"  (1.854 m), weight 228 lb (103.4 kg), SpO2 94%.  LABORATORY DATA: Lab Results  Component Value Date   WBC 8.2 12/11/2023   HGB 12.1 (L) 12/11/2023   HCT 37.6 (L) 12/11/2023   MCV 96.4 12/11/2023   PLT 226 12/11/2023      Chemistry      Component Value Date/Time   NA 140 12/11/2023 0943   K 4.7 12/11/2023 0943   CL 107 12/11/2023 0943   CO2 29 12/11/2023 0943   BUN 19  12/11/2023 0943   CREATININE 1.25 (H) 12/11/2023 0943      Component Value Date/Time   CALCIUM  9.4 12/11/2023 0943   ALKPHOS 58 12/11/2023 0943   AST 16 12/11/2023 0943   ALT 13 12/11/2023 0943   BILITOT 0.5 12/11/2023 0943       RADIOGRAPHIC STUDIES: CT Chest W Contrast Result Date: 12/16/2023 CLINICAL DATA:  73 year old male with history of non-small-cell lung cancer. Restaging. * Tracking Code: BO * EXAM: CT CHEST WITH CONTRAST TECHNIQUE: Multidetector CT imaging of the chest was performed during intravenous contrast administration. RADIATION DOSE REDUCTION: This exam was performed according to the departmental dose-optimization program which includes automated exposure control, adjustment of the mA and/or kV according to patient size and/or use of iterative reconstruction technique. CONTRAST:  75mL OMNIPAQUE  IOHEXOL  300 MG/ML  SOLN COMPARISON:  08/17/2023 FINDINGS: Cardiovascular: The heart size is normal. No substantial pericardial effusion. Coronary artery calcification is evident. Mild atherosclerotic calcification is noted in the wall of the thoracic aorta. Mediastinum/Nodes: No mediastinal lymphadenopathy. There is no hilar lymphadenopathy. The esophagus has normal imaging features. There is no axillary lymphadenopathy. Lungs/Pleura: Centrilobular emphsyema noted. Post treatment scarring in the suprahilar medial right upper lobe is stable in  the interval. Left upper lobe pulmonary nodule of concern (image 36/series 8) has shown interval progression, measuring 9 x 7 mm today compared to 6 mm previously. No other new suspicious pulmonary nodule or mass. No focal airspace consolidation. No pleural effusion. Upper Abdomen: 2.1 cm exophytic low-density lesion anterior interpolar right kidney approaches water  density, compatible with cyst. Tiny well-defined homogeneous low-density lesions in both kidneys are too small to characterize but are statistically most likely benign and probably cysts. No  followup imaging is recommended. The visualized portion of the upper abdomen is otherwise unremarkable. Musculoskeletal: No worrisome lytic or sclerotic osseous abnormality. IMPRESSION: 1. Interval progression of left upper lobe pulmonary nodule of concern, measuring 9 x 7 mm today compared to 6 mm previously. Continued interval progression heightens concern for metachronous primary neoplasm or metastatic disease. 2. Stable post treatment scarring in the suprahilar medial right upper lobe. 3. Aortic Atherosclerosis (ICD10-I70.0) and Emphysema (ICD10-J43.9). These results will be called to the ordering clinician or representative by the Radiologist Assistant, and communication documented in the PACS or [employer]. Electronically Signed   By: Donnal Fusi M.D.   On: 12/16/2023 08:26   ASSESSMENT AND PLAN: This is a very pleasant 73 years old white male diagnosed with a stage Ia (T1b, N0, M0) non-small cell lung cancer, squamous cell carcinoma presented with right upper lobe lung nodule diagnosed in March 2022 status post SBRT under the care of Dr. Jeryl Moris.  The patient is currently on observation. He is feeling fine today with no concerning complaints except for mild cough and shortness of breath with exertion. The patient had repeat CT scan of the chest performed recently.  I personally and independently reviewed the scan and discussed the result with the patient and his wife. His scan showed further increase in the size of the left upper lobe pulmonary nodule concerning for neoplasm.    Non-small cell lung cancer, stage 1A Stage 1A non-small cell lung cancer, squamous cell carcinoma, initially diagnosed in March 2022. Status post stereotactic body radiation therapy (SBRT) and under observation since April 2022. Recent CT scan shows an increase in size of a nodule in the left upper lobe, now measuring 9x7 mm compared to 6 mm previously, raising suspicion for cancer recurrence. Differential diagnosis  includes possible cancer recurrence. He expressed preference for radiation therapy over surgical intervention. - Order PET scan to evaluate the nodule in the left upper lobe. - If PET scan is positive, consider biopsy or radiation therapy. - Refer to Dr. Jeryl Moris for radiation therapy if needed.  Chronic obstructive pulmonary disease (COPD) COPD with recent exacerbation symptoms including a nagging cough for three weeks and shortness of breath. No current use of home oxygen. Symptoms may be contributing to breathing difficulties. Discussed need for adjustment of inhalers to manage COPD symptoms. - Adjust inhalers for COPD management.   The patient was advised to call immediately if he has any concerning symptoms in the interval.  The patient voices understanding of current disease status and treatment options and is in agreement with the current care plan.  All questions were answered. The patient knows to call the clinic with any problems, questions or concerns. We can certainly see the patient much sooner if necessary.  The total time spent in the appointment was 30 minutes.  Disclaimer: This note was dictated with voice recognition software. Similar sounding words can inadvertently be transcribed and may not be corrected upon review.

## 2023-12-27 ENCOUNTER — Encounter (HOSPITAL_COMMUNITY)
Admission: RE | Admit: 2023-12-27 | Discharge: 2023-12-27 | Disposition: A | Discharge status: Home / Self Care | Source: Ambulatory Visit | Attending: Internal Medicine | Admitting: Internal Medicine

## 2023-12-27 DIAGNOSIS — C3490 Malignant neoplasm of unspecified part of unspecified bronchus or lung: Secondary | ICD-10-CM | POA: Diagnosis present

## 2023-12-27 DIAGNOSIS — J189 Pneumonia, unspecified organism: Secondary | ICD-10-CM

## 2023-12-27 HISTORY — DX: Pneumonia, unspecified organism: J18.9

## 2023-12-27 LAB — GLUCOSE, CAPILLARY: Glucose-Capillary: 109 mg/dL — ABNORMAL HIGH (ref 70–99)

## 2023-12-27 MED ORDER — FLUDEOXYGLUCOSE F - 18 (FDG) INJECTION
11.3900 | Freq: Once | INTRAVENOUS | Status: AC
  Administered 2023-12-27: 11.39 via INTRAVENOUS

## 2024-01-08 ENCOUNTER — Inpatient Hospital Stay: Attending: Internal Medicine | Admitting: Internal Medicine

## 2024-01-08 VITALS — BP 122/62 | HR 55 | Temp 97.9°F | Resp 16 | Ht 73.0 in | Wt 219.5 lb

## 2024-01-08 DIAGNOSIS — Z923 Personal history of irradiation: Secondary | ICD-10-CM | POA: Insufficient documentation

## 2024-01-08 DIAGNOSIS — C3411 Malignant neoplasm of upper lobe, right bronchus or lung: Secondary | ICD-10-CM

## 2024-01-08 DIAGNOSIS — Z85118 Personal history of other malignant neoplasm of bronchus and lung: Secondary | ICD-10-CM | POA: Diagnosis present

## 2024-01-08 MED ORDER — DOXYCYCLINE HYCLATE 100 MG PO TABS: 100.0000 mg | ORAL_TABLET | Freq: Two times a day (BID) | ORAL | 0 refills | Status: DC

## 2024-01-08 NOTE — Progress Notes (Signed)
 [facility] [phone]   [phone]  OFFICE PROGRESS NOTE  Care, [name] [address] [place] [address]  DIAGNOSIS: Stage IA (T1b, N0, M0) non-small cell lung cancer, squamous cell carcinoma presented with right upper lobe lung nodule.  Diagnosed in March 2022   PRIOR THERAPY: SBRT under the care of Dr. Jeryl Moris completed on November 09, 2020.   CURRENT THERAPY: Observation  INTERVAL HISTORY: Luis Haney 73 y.o. male returns to the clinic today for follow-up visit accompanied by his wife.  Discussed the use of AI scribe software for clinical note transcription with the patient, who gave verbal consent to proceed.  History of Present Illness   Luis Haney is a 73 year old male with stage one A non-small cell lung cancer who presents with an enlarging left upper lobe pulmonary nodule.  He was diagnosed with stage one A non-small cell lung cancer, squamous cell carcinoma, in March 2022. He underwent stereotactic body radiation therapy (SBRT) to the right upper lobe lung nodule in April 2022 and has been under observation since then. A recent CT scan revealed an enlarging nodule in the left upper lobe of the lung.  He experiences occasional tiredness and shortness of breath when mowing the yard. He has a slight cough but no hemoptysis. He frequently feels cold but has no fever or chills. He has lost weight, decreasing from 229 pounds to 219.5 pounds since his last visit.  He recalls having pneumonia in the past, which led to the discovery of the initial lung nodule. He has had multiple doctors involved in his care and does not recall who performed his initial biopsy.       MEDICAL HISTORY: Past Medical History:  Diagnosis Date   COPD (chronic obstructive pulmonary disease) (HCC)    Dyspnea    occasuional with exertion   Lung cancer (HCC) 09/2020   Pleural effusion 06/2020   Pneumonia    Pulmonary nodule 09/2020    ALLERGIES:   has no known allergies.  MEDICATIONS:  Current Outpatient Medications  Medication Sig Dispense Refill   doxycycline (VIBRA-TABS) 100 MG tablet Take 1 tablet (100 mg total) by mouth 2 (two) times daily. 14 tablet 0   albuterol  (VENTOLIN  HFA) 108 (90 Base) MCG/ACT inhaler TAKE 2 PUFFS BY MOUTH EVERY 6 HOURS AS NEEDED FOR WHEEZE OR SHORTNESS OF BREATH 8.5 each 6   atorvastatin (LIPITOR) 20 MG tablet Take 20 mg by mouth daily.     calcium  carbonate (OS-CAL - DOSED IN MG OF ELEMENTAL CALCIUM ) 1250 (500 Ca) MG tablet Take 1 tablet by mouth 3 (three) times daily with meals.     Multiple Vitamin (MULTI VITAMIN MENS PO) Take 1 tablet by mouth daily.     Potassium 99 MG TABS Take 99 mg by mouth daily.     TRELEGY ELLIPTA  100-62.5-25 MCG/ACT AEPB INHALE 1 PUFF BY MOUTH EVERY DAY 60 each 5   No current facility-administered medications for this visit.   Facility-Administered Medications Ordered in Other Visits  Medication Dose Route Frequency Provider Last Rate Last Admin   albuterol  (PROVENTIL ) (2.5 MG/3ML) 0.083% nebulizer solution 2.5 mg  2.5 mg Nebulization Once Antonio Baumgarten, NP        SURGICAL HISTORY:  Past Surgical History:  Procedure Laterality Date   BACK SURGERY  2000   BRONCHIAL BIOPSY  10/07/2020   Procedure: BRONCHIAL BIOPSIES;  Surgeon: Denson Flake, MD;  Location: [facility] ENDOSCOPY;  Service: Pulmonary;;   BRONCHIAL BRUSHINGS  10/07/2020   Procedure: BRONCHIAL BRUSHINGS;  Surgeon: Denson Flake, MD;  Location: [facility] ENDOSCOPY;  Service: Pulmonary;;   BRONCHIAL NEEDLE ASPIRATION BIOPSY  10/07/2020   Procedure: BRONCHIAL NEEDLE ASPIRATION BIOPSIES;  Surgeon: Denson Flake, MD;  Location: [facility] ENDOSCOPY;  Service: Pulmonary;;   CHEST TUBE INSERTION Right 06/2020   CHEST TUBE INSERTION Right 10/07/2020   NECK SURGERY  2012   SUBMUCOSAL INJECTION  10/07/2020   Procedure: FIDUCIAL DYE MARKING;  Surgeon: Denson Flake, MD;  Location: [facility] ENDOSCOPY;  Service: Pulmonary;;   TONSILLECTOMY      removed as a chld   VIDEO BRONCHOSCOPY WITH ENDOBRONCHIAL NAVIGATION N/A 10/07/2020   Procedure: VIDEO BRONCHOSCOPY WITH ENDOBRONCHIAL NAVIGATION;  Surgeon: Denson Flake, MD;  Location: [facility] ENDOSCOPY;  Service: Pulmonary;  Laterality: N/A;    REVIEW OF SYSTEMS:  Constitutional: positive for fatigue Eyes: negative Ears, nose, mouth, throat, and face: negative Respiratory: positive for cough and dyspnea on exertion Cardiovascular: negative Gastrointestinal: negative Genitourinary:negative Integument/breast: negative Hematologic/lymphatic: negative Musculoskeletal:negative Neurological: negative Behavioral/Psych: negative Endocrine: negative Allergic/Immunologic: negative   PHYSICAL EXAMINATION: General appearance: alert, cooperative, fatigued, and no distress Head: Normocephalic, without obvious abnormality, atraumatic Neck: no adenopathy, no JVD, supple, symmetrical, trachea midline, and thyroid not enlarged, symmetric, no tenderness/mass/nodules Lymph nodes: Cervical, supraclavicular, and axillary nodes normal. Resp: clear to auscultation bilaterally Back: symmetric, no curvature. ROM normal. No CVA tenderness. Cardio: regular rate and rhythm, S1, S2 normal, no murmur, click, rub or gallop GI: soft, non-tender; bowel sounds normal; no masses,  no organomegaly Extremities: extremities normal, atraumatic, no cyanosis or edema Neurologic: Alert and oriented X 3, normal strength and tone. Normal symmetric reflexes. Normal coordination and gait  ECOG PERFORMANCE STATUS: 1 - Symptomatic but completely ambulatory  Blood pressure 122/62, pulse (!) 55, temperature 97.9 F (36.6 C), temperature source Oral, resp. rate 16, height 6' 1 (1.854 m), weight 219 lb 8 oz (99.6 kg), SpO2 99%.  LABORATORY DATA: Lab Results  Component Value Date   WBC 8.2 12/11/2023   HGB 12.1 (L) 12/11/2023   HCT 37.6 (L) 12/11/2023   MCV 96.4 12/11/2023   PLT 226 12/11/2023      Chemistry       Component Value Date/Time   NA 140 12/11/2023 0943   K 4.7 12/11/2023 0943   CL 107 12/11/2023 0943   CO2 29 12/11/2023 0943   BUN 19 12/11/2023 0943   CREATININE 1.25 (H) 12/11/2023 0943      Component Value Date/Time   CALCIUM  9.4 12/11/2023 0943   ALKPHOS 58 12/11/2023 0943   AST 16 12/11/2023 0943   ALT 13 12/11/2023 0943   BILITOT 0.5 12/11/2023 0943       RADIOGRAPHIC STUDIES: NM PET Image Restage (PS) Skull Base to Thigh (F-18 FDG) Result Date: 01/07/2024 CLINICAL DATA:  Subsequent treatment strategy for non-small cell lung cancer. EXAM: NUCLEAR MEDICINE PET SKULL BASE TO THIGH TECHNIQUE: 11.4 mCi F-18 FDG was injected intravenously. Full-ring PET imaging was performed from the skull base to thigh after the radiotracer. CT data was obtained and used for attenuation correction and anatomic localization. Fasting blood glucose: 109 mg/dl COMPARISON:  16/04/9603 FINDINGS: Mediastinal blood pool activity: SUV max 2.7 Liver activity: SUV max NA NECK: No significant abnormal hypermetabolic activity in this region. Incidental CT findings: Complete opacification of the right frontal sinus which could be from acute sinusitis or chronic sinusitis. CHEST: The left upper lobe nodule of concern has a maximum SUV  of 3.2, which given its small but increasing size is suspicious for malignancy. There is new hypermetabolic consolidation peripherally in the right lower lobe, maximum SUV 9.1, favoring pneumonia. Reticulonodular and tree-in-bud nodularity posteriorly in the right upper lobe is most characteristic of atypical infectious bronchiolitis and has a maximum SUV of 3.5. This was not present on 12/11/2023. There is some bandlike airspace opacity posteriorly in the left lower lobe likewise not present on 12/11/2023, maximum SUV 4.2, favoring pneumonia or aspiration pneumonitis. Distal esophageal activity is likely physiologic, maximum SUV 4.7. No hypermetabolic adenopathy. Incidental CT findings:  Suspected right upper paramediastinal fibrosis. Coronary, aortic arch, and branch vessel atherosclerotic vascular disease. Mild cardiomegaly. ABDOMEN/PELVIS: No significant abnormal hypermetabolic activity in this region. Incidental CT findings: Atherosclerosis is present, including aortoiliac atherosclerotic disease. Mildly fatty left spermatic cord. SKELETON: No significant abnormal hypermetabolic activity in this region. Incidental CT findings: None. IMPRESSION: 1. The left upper lobe nodule of concern has a maximum SUV of 3.2, which given its small but increasing size is suspicious for malignancy. 2. No findings of hypermetabolic metastatic disease. 3. New hypermetabolic consolidation peripherally in the right lower lobe, maximum SUV 9.1, favoring pneumonia. 4. New reticulonodular and tree-in-bud nodularity posteriorly in the right upper lobe, maximum SUV 3.5, most characteristic of atypical infectious bronchiolitis. 5. New bandlike airspace opacity posteriorly in the left lower lobe, maximum SUV 4.2, favoring pneumonia or aspiration pneumonitis. 6. Complete opacification of the right frontal sinus which could be from acute sinusitis or chronic sinusitis. 7. Mild cardiomegaly. 8. Mildly fatty left spermatic cord. 9.  Aortic Atherosclerosis (ICD10-I70.0). Electronically Signed   By: Freida Jes M.D.   On: 01/07/2024 17:12   CT Chest W Contrast Result Date: 12/16/2023 CLINICAL DATA:  73 year old male with history of non-small-cell lung cancer. Restaging. * Tracking Code: BO * EXAM: CT CHEST WITH CONTRAST TECHNIQUE: Multidetector CT imaging of the chest was performed during intravenous contrast administration. RADIATION DOSE REDUCTION: This exam was performed according to the departmental dose-optimization program which includes automated exposure control, adjustment of the mA and/or kV according to patient size and/or use of iterative reconstruction technique. CONTRAST:  75mL OMNIPAQUE  IOHEXOL  300 MG/ML   SOLN COMPARISON:  08/17/2023 FINDINGS: Cardiovascular: The heart size is normal. No substantial pericardial effusion. Coronary artery calcification is evident. Mild atherosclerotic calcification is noted in the wall of the thoracic aorta. Mediastinum/Nodes: No mediastinal lymphadenopathy. There is no hilar lymphadenopathy. The esophagus has normal imaging features. There is no axillary lymphadenopathy. Lungs/Pleura: Centrilobular emphsyema noted. Post treatment scarring in the suprahilar medial right upper lobe is stable in the interval. Left upper lobe pulmonary nodule of concern (image 36/series 8) has shown interval progression, measuring 9 x 7 mm today compared to 6 mm previously. No other new suspicious pulmonary nodule or mass. No focal airspace consolidation. No pleural effusion. Upper Abdomen: 2.1 cm exophytic low-density lesion anterior interpolar right kidney approaches water  density, compatible with cyst. Tiny well-defined homogeneous low-density lesions in both kidneys are too small to characterize but are statistically most likely benign and probably cysts. No followup imaging is recommended. The visualized portion of the upper abdomen is otherwise unremarkable. Musculoskeletal: No worrisome lytic or sclerotic osseous abnormality. IMPRESSION: 1. Interval progression of left upper lobe pulmonary nodule of concern, measuring 9 x 7 mm today compared to 6 mm previously. Continued interval progression heightens concern for metachronous primary neoplasm or metastatic disease. 2. Stable post treatment scarring in the suprahilar medial right upper lobe. 3. Aortic Atherosclerosis (ICD10-I70.0) and Emphysema (ICD10-J43.9).  These results will be called to the ordering clinician or representative by the Radiologist Assistant, and communication documented in the PACS or [employer]. Electronically Signed   By: Donnal Fusi M.D.   On: 12/16/2023 08:26   ASSESSMENT AND PLAN: This is a very pleasant 73 years  old white male diagnosed with a stage Ia (T1b, N0, M0) non-small cell lung cancer, squamous cell carcinoma presented with right upper lobe lung nodule diagnosed in March 2022 status post SBRT under the care of Dr. Jeryl Moris.  The patient is currently on observation. He is feeling fine today with no concerning complaints except for mild cough and shortness of breath with exertion. The patient had repeat CT scan of the chest performed recently.  I personally and independently reviewed the scan and discussed the result with the patient and his wife. His scan showed further increase in the size of the left upper lobe pulmonary nodule concerning for neoplasm. The patient had a PET scan that showed hypermetabolic activity in the left upper lobe lung nodule but there was also some evidence of pneumonia in the right lower lobe. Assessment and Plan    Non-small cell lung cancer, stage 1A Stage 1A non-small cell lung cancer, squamous cell carcinoma, initially diagnosed in March 2022. Status post SPRT to the right upper lobe lung nodule in April 2022. Recent CT scan shows an enlarging left upper lobe pulmonary nodule, suspicious for malignancy. Discussed management options: direct radiation or biopsy confirmation prior to radiation. Standard approach is biopsy first to confirm malignancy and avoid unnecessary treatment if inflammatory. - Refer to pulmonologist for biopsy of the left upper lobe nodule - If biopsy confirms cancer, refer to Dr. Jeryl Moris for radiation treatment - If biopsy indicates inflammation, continue observation  Pneumonia Suspicious pneumonia in the right lung identified on PET scan. Symptoms include mild exertional dyspnea, occasional cough, and recent weight loss. No fever, chills, or hemoptysis reported. Plan to treat with antibiotics. - Prescribe doxycycline 100 mg twice a day for 7 days - Send prescription to [facility] [place] [facility]   He was advised to call immediately if he has any other  concerning symptoms in the interval. The patient voices understanding of current disease status and treatment options and is in agreement with the current care plan.  All questions were answered. The patient knows to call the clinic with any problems, questions or concerns. We can certainly see the patient much sooner if necessary.  The total time spent in the appointment was 30 minutes.  Disclaimer: This note was dictated with voice recognition software. Similar sounding words can inadvertently be transcribed and may not be corrected upon review.

## 2024-01-16 ENCOUNTER — Telehealth: Payer: Self-pay

## 2024-01-16 ENCOUNTER — Encounter: Payer: Self-pay | Admitting: Pulmonary Disease

## 2024-01-16 ENCOUNTER — Ambulatory Visit: Admitting: Pulmonary Disease

## 2024-01-16 VITALS — BP 118/76 | HR 57 | Temp 97.1°F | Ht 73.0 in | Wt 217.2 lb

## 2024-01-16 DIAGNOSIS — J4489 Other specified chronic obstructive pulmonary disease: Secondary | ICD-10-CM | POA: Diagnosis not present

## 2024-01-16 DIAGNOSIS — J439 Emphysema, unspecified: Secondary | ICD-10-CM | POA: Diagnosis not present

## 2024-01-16 DIAGNOSIS — R911 Solitary pulmonary nodule: Secondary | ICD-10-CM | POA: Diagnosis not present

## 2024-01-16 MED ORDER — TRELEGY ELLIPTA 200-62.5-25 MCG/ACT IN AEPB
1.0000 | INHALATION_SPRAY | Freq: Every day | RESPIRATORY_TRACT | 6 refills | Status: DC
Start: 2024-01-16 — End: 2024-04-29

## 2024-01-16 MED ORDER — ALBUTEROL SULFATE HFA 108 (90 BASE) MCG/ACT IN AERS: 2.0000 | INHALATION_SPRAY | RESPIRATORY_TRACT | 11 refills | Status: AC | PRN

## 2024-01-16 MED ORDER — TRELEGY ELLIPTA 200-62.5-25 MCG/ACT IN AEPB: 1.0000 | INHALATION_SPRAY | Freq: Every day | RESPIRATORY_TRACT | 0 refills | Status: DC

## 2024-01-16 NOTE — Telephone Encounter (Signed)
 Robotic Bronchoscopy with EBUS 01/29/2024 at 9:00am Lung Nodule 31627, [id number], [id number]  Donzell please see Bronch info.  Patient is aware of date and time.

## 2024-01-16 NOTE — Addendum Note (Signed)
 Addended by: VICCI EVALENE DEL on: 01/16/2024 04:47 PM   Modules accepted: Orders

## 2024-01-16 NOTE — Progress Notes (Signed)
 Synopsis: Referred in by Sherrod Sherrod, MD   Subjective:   PATIENT ID: Luis Haney GENDER: male DOB: 22-Sep-1950, MRN: [id number]  Chief Complaint  Patient presents with   Follow-up    Doe. No wheezing. Occasional cough.    HPI Luis Haney is a pleasant 73 year old male patient with a past medical history of right upper lobe squamous cell carcinoma status post SBRT in 2022 presenting today to the pulmonary clinic for evaluation of left upper lobe nodule.  He was diagnosed with a right upper lobe squamous cell carcinoma with robotic assisted navigational bronchoscopy and biopsy of right upper lobe nodule in 2022.  He underwent SBRT and has been following radiologically with Dr. Gatha.  New left upper lobe nodule was noticed on his scan in January 2025 this prompted a repeat CT chest in May 2025 that redemonstrated the nodule but with interval progression now measuring 9 mm x 7 mm.  PET scan 12/2023 demonstrated an SUV of 3.2 in the left upper lobe nodule she raise suspicion for new primary malignancy.  PFTs 2022 - Obstructive defect with significant response to bronchodilators. Normal Lung volumes, DLCO mildly reduced. Consistent with COPD and asthma overlap.   Family history - Unknown.   Social history - 1/5 packs per day quit in 2022 started at 73 years old.   ROS All systems were reviewed and are negative except for the above.  Objective:   Vitals:   01/16/24 1023  BP: 118/76  Pulse: (!) 57  Temp: (!) 97.1 F (36.2 C)  SpO2: 98%  Weight: 217 lb 3.2 oz (98.5 kg)  Height: 6' 1 (1.854 m)   98% on RA BMI Readings from Last 3 Encounters:  01/16/24 28.66 kg/m  01/08/24 28.96 kg/m  12/18/23 30.08 kg/m   Wt Readings from Last 3 Encounters:  01/16/24 217 lb 3.2 oz (98.5 kg)  01/08/24 219 lb 8 oz (99.6 kg)  12/18/23 228 lb (103.4 kg)    Physical Exam GEN: NAD, Healthy Appearing HEENT: Supple Neck, Reactive Pupils, EOMI  CVS: Normal S1, Normal S2, RRR,  No murmurs or ES appreciated  Lungs: Clear bilateral air entry.  Abdomen: Soft, non tender, non distended, + BS  Extremities: Warm and well perfused, No edema  Skin: No suspicious lesions appreciated  Psych: Normal Affect  Ancillary Information   CBC    Component Value Date/Time   WBC 8.2 12/11/2023 0943   WBC 8.9 10/09/2020 0035   RBC 3.90 (L) 12/11/2023 0943   HGB 12.1 (L) 12/11/2023 0943   HCT 37.6 (L) 12/11/2023 0943   PLT 226 12/11/2023 0943   MCV 96.4 12/11/2023 0943   MCH 31.0 12/11/2023 0943   MCHC 32.2 12/11/2023 0943   RDW 13.6 12/11/2023 0943   LYMPHSABS 1.4 12/11/2023 0943   MONOABS 0.5 12/11/2023 0943   EOSABS 0.2 12/11/2023 0943   BASOSABS 0.1 12/11/2023 0943    Labs and imaging were reviewed.     Latest Ref Rng & Units 08/23/2020    2:08 PM  PFT Results  FVC-Predicted Pre % 63  P  FVC-Post L 3.21  P  FVC-Predicted Post % 70  P  Pre FEV1/FVC % % 68  P  Post FEV1/FCV % % 69  P  FEV1-Pre L 1.99  P  FEV1-Predicted Pre % 59  P  FEV1-Post L 2.20  P  DLCO uncorrected ml/min/mmHg 15.21  P  DLCO UNC% % 57  P  DLVA Predicted % 77  P  TLC L  6.47  P  TLC % Predicted % 89  P  RV % Predicted % 106  P    P Preliminary result     Assessment & Plan:  Luis Haney is a pleasant 73 year old male patient with a past medical history of right upper lobe squamous cell carcinoma status post SBRT in 2022 presenting today to the pulmonary clinic for evaluation of left upper lobe nodule.  #History of RUL SCC s/p SBRT 2022 #New concerning LUL nodule 9mm SUV 3.7. (MAYO 67.4% Prob of malignancy).  #COPD with asthma overlap.   Discussed risk and benefits of Robotic assisted Navigational bronchoscopy including risks of bleeding and pneumothorax which occur in less than 1% of the time. He is agreeable to proceed with the procedure for diagnostic purposes.   []  RANB with EBUS 01/29/2024  []  CT chest Super D 01/28/2024  []  Change to trelegy 200 1 puff daily.  Return in  about 3 months (around 04/17/2024).  I spent 60 minutes caring for this patient today, including preparing to see the patient, obtaining a medical history , reviewing a separately obtained history, performing a medically appropriate examination and/or evaluation, counseling and educating the patient/family/caregiver, ordering medications, tests, or procedures, documenting clinical information in the electronic health record, and independently interpreting results (not separately reported/billed) and communicating results to the patient/family/caregiver  Darrin Barn, MD [facility] 01/16/2024 10:46 AM

## 2024-01-16 NOTE — H&P (View-Only) (Signed)
 Synopsis: Referred in by Sherrod Sherrod, MD   Subjective:   PATIENT ID: Luis Haney GENDER: male DOB: 22-Sep-1950, MRN: [id number]  Chief Complaint  Patient presents with   Follow-up    Doe. No wheezing. Occasional cough.    HPI Luis Haney is a pleasant 73 year old male patient with a past medical history of right upper lobe squamous cell carcinoma status post SBRT in 2022 presenting today to the pulmonary clinic for evaluation of left upper lobe nodule.  He was diagnosed with a right upper lobe squamous cell carcinoma with robotic assisted navigational bronchoscopy and biopsy of right upper lobe nodule in 2022.  He underwent SBRT and has been following radiologically with Dr. Gatha.  New left upper lobe nodule was noticed on his scan in January 2025 this prompted a repeat CT chest in May 2025 that redemonstrated the nodule but with interval progression now measuring 9 mm x 7 mm.  PET scan 12/2023 demonstrated an SUV of 3.2 in the left upper lobe nodule she raise suspicion for new primary malignancy.  PFTs 2022 - Obstructive defect with significant response to bronchodilators. Normal Lung volumes, DLCO mildly reduced. Consistent with COPD and asthma overlap.   Family history - Unknown.   Social history - 1/5 packs per day quit in 2022 started at 73 years old.   ROS All systems were reviewed and are negative except for the above.  Objective:   Vitals:   01/16/24 1023  BP: 118/76  Pulse: (!) 57  Temp: (!) 97.1 F (36.2 C)  SpO2: 98%  Weight: 217 lb 3.2 oz (98.5 kg)  Height: 6' 1 (1.854 m)   98% on RA BMI Readings from Last 3 Encounters:  01/16/24 28.66 kg/m  01/08/24 28.96 kg/m  12/18/23 30.08 kg/m   Wt Readings from Last 3 Encounters:  01/16/24 217 lb 3.2 oz (98.5 kg)  01/08/24 219 lb 8 oz (99.6 kg)  12/18/23 228 lb (103.4 kg)    Physical Exam GEN: NAD, Healthy Appearing HEENT: Supple Neck, Reactive Pupils, EOMI  CVS: Normal S1, Normal S2, RRR,  No murmurs or ES appreciated  Lungs: Clear bilateral air entry.  Abdomen: Soft, non tender, non distended, + BS  Extremities: Warm and well perfused, No edema  Skin: No suspicious lesions appreciated  Psych: Normal Affect  Ancillary Information   CBC    Component Value Date/Time   WBC 8.2 12/11/2023 0943   WBC 8.9 10/09/2020 0035   RBC 3.90 (L) 12/11/2023 0943   HGB 12.1 (L) 12/11/2023 0943   HCT 37.6 (L) 12/11/2023 0943   PLT 226 12/11/2023 0943   MCV 96.4 12/11/2023 0943   MCH 31.0 12/11/2023 0943   MCHC 32.2 12/11/2023 0943   RDW 13.6 12/11/2023 0943   LYMPHSABS 1.4 12/11/2023 0943   MONOABS 0.5 12/11/2023 0943   EOSABS 0.2 12/11/2023 0943   BASOSABS 0.1 12/11/2023 0943    Labs and imaging were reviewed.     Latest Ref Rng & Units 08/23/2020    2:08 PM  PFT Results  FVC-Predicted Pre % 63  P  FVC-Post L 3.21  P  FVC-Predicted Post % 70  P  Pre FEV1/FVC % % 68  P  Post FEV1/FCV % % 69  P  FEV1-Pre L 1.99  P  FEV1-Predicted Pre % 59  P  FEV1-Post L 2.20  P  DLCO uncorrected ml/min/mmHg 15.21  P  DLCO UNC% % 57  P  DLVA Predicted % 77  P  TLC L  6.47  P  TLC % Predicted % 89  P  RV % Predicted % 106  P    P Preliminary result     Assessment & Plan:  Luis Haney is a pleasant 73 year old male patient with a past medical history of right upper lobe squamous cell carcinoma status post SBRT in 2022 presenting today to the pulmonary clinic for evaluation of left upper lobe nodule.  #History of RUL SCC s/p SBRT 2022 #New concerning LUL nodule 9mm SUV 3.7. (MAYO 67.4% Prob of malignancy).  #COPD with asthma overlap.   Discussed risk and benefits of Robotic assisted Navigational bronchoscopy including risks of bleeding and pneumothorax which occur in less than 1% of the time. He is agreeable to proceed with the procedure for diagnostic purposes.   []  RANB with EBUS 01/29/2024  []  CT chest Super D 01/28/2024  []  Change to trelegy 200 1 puff daily.  Return in  about 3 months (around 04/17/2024).  I spent 60 minutes caring for this patient today, including preparing to see the patient, obtaining a medical history , reviewing a separately obtained history, performing a medically appropriate examination and/or evaluation, counseling and educating the patient/family/caregiver, ordering medications, tests, or procedures, documenting clinical information in the electronic health record, and independently interpreting results (not separately reported/billed) and communicating results to the patient/family/caregiver  Darrin Barn, MD [facility] 01/16/2024 10:46 AM

## 2024-01-17 NOTE — Telephone Encounter (Signed)
 Noted. Nothing further needed.

## 2024-01-17 NOTE — Telephone Encounter (Signed)
 For the codes 68372, [id number], [id number] Auth # [id number] valid 01/16/24 to 04/15/2024

## 2024-01-23 ENCOUNTER — Other Ambulatory Visit: Payer: Self-pay

## 2024-01-23 ENCOUNTER — Encounter
Admission: RE | Admit: 2024-01-23 | Discharge: 2024-01-23 | Disposition: A | Discharge status: Home / Self Care | Source: Ambulatory Visit | Attending: Pulmonary Disease | Admitting: Pulmonary Disease

## 2024-01-23 DIAGNOSIS — J869 Pyothorax without fistula: Secondary | ICD-10-CM

## 2024-01-23 DIAGNOSIS — Z01810 Encounter for preprocedural cardiovascular examination: Secondary | ICD-10-CM

## 2024-01-23 DIAGNOSIS — J438 Other emphysema: Secondary | ICD-10-CM

## 2024-01-23 HISTORY — DX: Anemia, unspecified: D64.9

## 2024-01-23 HISTORY — DX: Other elevated white blood cell count: D72.828

## 2024-01-23 HISTORY — DX: Tobacco use: Z72.0

## 2024-01-23 HISTORY — DX: Hyperlipidemia, unspecified: E78.5

## 2024-01-23 HISTORY — DX: Solitary pulmonary nodule: R91.1

## 2024-01-23 NOTE — Patient Instructions (Signed)
 Your procedure is scheduled on:01-29-24 Tuesday Report to the Registration Desk on the 1st floor of the [facility].Then proceed to the 2nd floor Surgery Desk To find out your arrival time, please call [phone] between 1PM - 3PM on:01-28-24 Monday If your arrival time is 6:00 am, do not arrive before that time as the [facility] entrance doors do not open until 6:00 am.  REMEMBER: Instructions that are not followed completely may result in serious medical risk, up to and including death; or upon the discretion of your surgeon and anesthesiologist your surgery may need to be rescheduled.  Do not eat food OR drink any liquids after midnight the night before surgery.  No gum chewing or hard candies.  One week prior to surgery:Stop NOW (01-23-24) Stop Anti-inflammatories (NSAIDS) such as Advil, Aleve, Ibuprofen, Motrin, Naproxen, Naprosyn and Aspirin based products such as Excedrin, Goody's Powder, BC Powder. Stop ANY OVER THE COUNTER supplements until after surgery (Calcium , Potassium, Multivitamin)  You may however, continue to take Tylenol  if needed for pain up until the day of surgery.  Continue taking all of your other prescription medications up until the day of surgery.  ON THE DAY OF SURGERY ONLY TAKE THESE MEDICATIONS WITH SIPS OF WATER : -atorvastatin (LIPITOR)   Use your Trelegy Ellipta  and Albuterol  Inhaler the day of surgery and bring your Albuterol  Inhaler to the hospital  No Alcohol for 24 hours before or after surgery.  No Smoking including e-cigarettes for 24 hours before surgery.  No chewable tobacco products for at least 6 hours before surgery.  No nicotine  patches on the day of surgery.  Do not use any recreational drugs for at least a week (preferably 2 weeks) before your surgery.  Please be advised that the combination of cocaine and anesthesia may have negative outcomes, up to and including death. If you test positive for cocaine, your surgery will be  cancelled.  On the morning of surgery brush your teeth with toothpaste and water , you may rinse your mouth with mouthwash if you wish. Do not swallow any toothpaste or mouthwash.  Do not wear jewelry, make-up, hairpins, clips or nail polish.  For welded (permanent) jewelry: bracelets, anklets, waist bands, etc.  Please have this removed prior to surgery.  If it is not removed, there is a chance that hospital personnel will need to cut it off on the day of surgery.  Do not wear lotions, powders, or perfumes.   Do not shave body hair from the neck down 48 hours before surgery.  Contact lenses, hearing aids and dentures may not be worn into surgery.  Do not bring valuables to the hospital. [facility] is not responsible for any missing/lost belongings or valuables.   Notify your doctor if there is any change in your medical condition (cold, fever, infection).  Wear comfortable clothing (specific to your surgery type) to the hospital.  After surgery, you can help prevent lung complications by doing breathing exercises.  Take deep breaths and cough every 1-2 hours. Your doctor may order a device called an Incentive Spirometer to help you take deep breaths. When coughing or sneezing, hold a pillow firmly against your incision with both hands. This is called "splinting." Doing this helps protect your incision. It also decreases belly discomfort.  If you are being admitted to the hospital overnight, leave your suitcase in the car. After surgery it may be brought to your room.  In case of increased patient census, it may be necessary for you, the  patient, to continue your postoperative care in the Same Day Surgery department.  If you are being discharged the day of surgery, you will not be allowed to drive home. You will need a responsible individual to drive you home and stay with you for 24 hours after surgery.   If you are taking public transportation, you will need to have a responsible  individual with you.  Please call the Pre-admissions Testing Dept. at [phone] if you have any questions about these instructions.  Surgery Visitation Policy:  Patients having surgery or a procedure may have two visitors.  Children under the age of 37 must have an adult with them who is not the patient.   Merchandiser, retail to address health-related social needs:  [email or web address]

## 2024-01-23 NOTE — Progress Notes (Signed)
 Completed anesthesia interview. Informed pt he needs to come in for EKG. Pt unable to come tomorrow (01-24-24) and we are closed for July 4th. Pt will come in Monday 7-7 prior to his CT scan in the medical mall

## 2024-01-24 ENCOUNTER — Encounter: Payer: Self-pay | Admitting: Internal Medicine

## 2024-01-28 ENCOUNTER — Ambulatory Visit
Admission: RE | Admit: 2024-01-28 | Discharge: 2024-01-28 | Disposition: A | Discharge status: Home / Self Care | Source: Ambulatory Visit | Attending: Pulmonary Disease | Admitting: Pulmonary Disease

## 2024-01-28 ENCOUNTER — Encounter
Admission: RE | Admit: 2024-01-28 | Discharge: 2024-01-28 | Disposition: A | Discharge status: Home / Self Care | Source: Ambulatory Visit | Attending: Pulmonary Disease

## 2024-01-28 DIAGNOSIS — J438 Other emphysema: Secondary | ICD-10-CM | POA: Insufficient documentation

## 2024-01-28 DIAGNOSIS — R911 Solitary pulmonary nodule: Secondary | ICD-10-CM | POA: Insufficient documentation

## 2024-01-28 DIAGNOSIS — Z01810 Encounter for preprocedural cardiovascular examination: Secondary | ICD-10-CM | POA: Insufficient documentation

## 2024-01-29 ENCOUNTER — Telehealth: Payer: Self-pay | Admitting: Pulmonary Disease

## 2024-01-29 ENCOUNTER — Encounter
Admission: RE | Disposition: A | Discharge status: Home / Self Care | Payer: Self-pay | Source: Home / Self Care | Attending: Pulmonary Disease

## 2024-01-29 ENCOUNTER — Ambulatory Visit
Admission: RE | Admit: 2024-01-29 | Discharge: 2024-01-29 | Disposition: A | Discharge status: Home / Self Care | Attending: Pulmonary Disease | Admitting: Pulmonary Disease

## 2024-01-29 ENCOUNTER — Encounter: Payer: Self-pay | Admitting: Urgent Care

## 2024-01-29 ENCOUNTER — Ambulatory Visit

## 2024-01-29 ENCOUNTER — Ambulatory Visit: Payer: Self-pay | Admitting: Anesthesiology

## 2024-01-29 ENCOUNTER — Encounter: Payer: Self-pay | Admitting: Pulmonary Disease

## 2024-01-29 ENCOUNTER — Other Ambulatory Visit: Payer: Self-pay

## 2024-01-29 DIAGNOSIS — J181 Lobar pneumonia, unspecified organism: Secondary | ICD-10-CM

## 2024-01-29 DIAGNOSIS — C3412 Malignant neoplasm of upper lobe, left bronchus or lung: Secondary | ICD-10-CM | POA: Diagnosis not present

## 2024-01-29 DIAGNOSIS — J4489 Other specified chronic obstructive pulmonary disease: Secondary | ICD-10-CM | POA: Insufficient documentation

## 2024-01-29 DIAGNOSIS — R911 Solitary pulmonary nodule: Secondary | ICD-10-CM | POA: Diagnosis present

## 2024-01-29 DIAGNOSIS — Z923 Personal history of irradiation: Secondary | ICD-10-CM | POA: Diagnosis not present

## 2024-01-29 DIAGNOSIS — Z87891 Personal history of nicotine dependence: Secondary | ICD-10-CM | POA: Diagnosis not present

## 2024-01-29 HISTORY — PX: VIDEO BRONCHOSCOPY WITH ENDOBRONCHIAL NAVIGATION: SHX6175

## 2024-01-29 HISTORY — PX: VIDEO BRONCHOSCOPY WITH ENDOBRONCHIAL ULTRASOUND: SHX6177

## 2024-01-29 SURGERY — VIDEO BRONCHOSCOPY WITH ENDOBRONCHIAL NAVIGATION
Anesthesia: General | Laterality: Left

## 2024-01-29 MED ORDER — AZITHROMYCIN 500 MG PO TABS: 500.0000 mg | ORAL_TABLET | Freq: Every day | ORAL | 0 refills | Status: AC

## 2024-01-29 MED ORDER — ROCURONIUM BROMIDE 100 MG/10ML IV SOLN
INTRAVENOUS | Status: DC | PRN
  Administered 2024-01-29: 70 mg via INTRAVENOUS

## 2024-01-29 MED ORDER — OXYCODONE HCL 5 MG PO TABS: 5.0000 mg | ORAL_TABLET | Freq: Once | ORAL | Status: DC | PRN

## 2024-01-29 MED ORDER — ACETAMINOPHEN 10 MG/ML IV SOLN: 1000.0000 mg | Freq: Once | INTRAVENOUS | Status: DC | PRN

## 2024-01-29 MED ORDER — FENTANYL CITRATE (PF) 100 MCG/2ML IJ SOLN
INTRAMUSCULAR | Status: AC
  Filled 2024-01-29: qty 2

## 2024-01-29 MED ORDER — EPHEDRINE SULFATE-NACL 50-0.9 MG/10ML-% IV SOSY
PREFILLED_SYRINGE | INTRAVENOUS | Status: DC | PRN
  Administered 2024-01-29: 5 mg via INTRAVENOUS
  Administered 2024-01-29: 10 mg via INTRAVENOUS
  Administered 2024-01-29 (×3): 5 mg via INTRAVENOUS

## 2024-01-29 MED ORDER — DEXAMETHASONE SODIUM PHOSPHATE 10 MG/ML IJ SOLN
INTRAMUSCULAR | Status: AC
  Filled 2024-01-29: qty 1

## 2024-01-29 MED ORDER — PROPOFOL 10 MG/ML IV BOLUS
INTRAVENOUS | Status: DC | PRN
  Administered 2024-01-29: 100 ug/kg/min via INTRAVENOUS
  Administered 2024-01-29: 150 mg via INTRAVENOUS

## 2024-01-29 MED ORDER — LACTATED RINGERS IV SOLN: INTRAVENOUS | Status: DC

## 2024-01-29 MED ORDER — PROPOFOL 10 MG/ML IV BOLUS
INTRAVENOUS | Status: AC
  Filled 2024-01-29: qty 20

## 2024-01-29 MED ORDER — AZITHROMYCIN 500 MG PO TABS: 500.0000 mg | ORAL_TABLET | Freq: Every day | ORAL | Status: AC

## 2024-01-29 MED ORDER — CHLORHEXIDINE GLUCONATE 0.12 % MT SOLN
15.0000 mL | Freq: Once | OROMUCOSAL | Status: AC
Start: 2024-01-29 — End: 2024-01-29
  Administered 2024-01-29: 15 mL via OROMUCOSAL

## 2024-01-29 MED ORDER — CHLORHEXIDINE GLUCONATE 0.12 % MT SOLN
OROMUCOSAL | Status: AC
  Filled 2024-01-29: qty 15

## 2024-01-29 MED ORDER — ONDANSETRON HCL 4 MG/2ML IJ SOLN
INTRAMUSCULAR | Status: AC
  Filled 2024-01-29: qty 2

## 2024-01-29 MED ORDER — ORAL CARE MOUTH RINSE: 15.0000 mL | Freq: Once | OROMUCOSAL | Status: AC

## 2024-01-29 MED ORDER — ONDANSETRON HCL 4 MG/2ML IJ SOLN: 4.0000 mg | Freq: Once | INTRAMUSCULAR | Status: DC | PRN

## 2024-01-29 MED ORDER — OXYCODONE HCL 5 MG/5ML PO SOLN: 5.0000 mg | Freq: Once | ORAL | Status: DC | PRN

## 2024-01-29 MED ORDER — LIDOCAINE HCL (CARDIAC) PF 100 MG/5ML IV SOSY
PREFILLED_SYRINGE | INTRAVENOUS | Status: DC | PRN
  Administered 2024-01-29: 100 mg via INTRAVENOUS

## 2024-01-29 MED ORDER — DEXAMETHASONE SODIUM PHOSPHATE 10 MG/ML IJ SOLN
INTRAMUSCULAR | Status: DC | PRN
  Administered 2024-01-29: 5 mg via INTRAVENOUS

## 2024-01-29 MED ORDER — SUGAMMADEX SODIUM 200 MG/2ML IV SOLN
INTRAVENOUS | Status: DC | PRN
  Administered 2024-01-29: 200 mg via INTRAVENOUS

## 2024-01-29 MED ORDER — FENTANYL CITRATE (PF) 100 MCG/2ML IJ SOLN
INTRAMUSCULAR | Status: DC | PRN
  Administered 2024-01-29: 100 ug via INTRAVENOUS

## 2024-01-29 MED ORDER — FENTANYL CITRATE (PF) 100 MCG/2ML IJ SOLN: 25.0000 ug | INTRAMUSCULAR | Status: DC | PRN

## 2024-01-29 MED ORDER — ONDANSETRON HCL 4 MG/2ML IJ SOLN
INTRAMUSCULAR | Status: DC | PRN
  Administered 2024-01-29: 4 mg via INTRAVENOUS

## 2024-01-29 NOTE — Op Note (Addendum)
 Video Bronchoscopy with Robotic Assisted Bronchoscopic Navigation   Date of Operation: 01/29/2024   Pre-op Diagnosis: Left upper lobe nodule  Post-op Diagnosis: Left upper lobe nodule  Surgeon: Coutney Wildermuth  Anesthesia: General endotracheal anesthesia  Operation: Flexible video fiberoptic bronchoscopy with robotic assistance and biopsies.  Estimated Blood Loss: Minimal  Complications: None  Indications and History: Luis Haney is a 73 y.o. male with history of Left upper lobe nodule.  Recommendation made to achieve a tissue diagnosis via robotic assisted navigational bronchoscopy.  The risks, benefits, complications, treatment options and expected outcomes were discussed with the patient.  The possibilities of pneumothorax, pneumonia, reaction to medication, pulmonary aspiration, perforation of a viscus, bleeding, failure to diagnose a condition and creating a complication requiring transfusion or operation were discussed with the patient who freely signed the consent.    Description of Procedure: The patient was seen in the Preoperative Area, was examined and was deemed appropriate to proceed.  The patient was taken to [facility] room 3, identified as Nancyann JONELLE Louder and the procedure verified as Flexible Video Fiberoptic Bronchoscopy.  A Time Out was held and the above information confirmed.   Prior to the date of the procedure a high-resolution CT scan of the chest was performed. Utilizing ION software program a virtual tracheobronchial tree was generated to allow the creation of distinct navigation pathways to the patient's parenchymal abnormalities. After being taken to the operating room general anesthesia was initiated and the patient  was orally intubated. The video fiberoptic bronchoscope was introduced via the endotracheal tube and a general inspection was performed which showed normal right and left lung anatomy. Aspiration of the bilateral mainstems was completed to remove any  remaining secretions. Robotic catheter inserted into patient's endotracheal tube.   Target #1 Left upper lobe nodule: The distinct navigation pathways prepared prior to this procedure were then utilized to navigate to patient's lesion identified on CT scan. The robotic catheter was secured into place and the vision probe was withdrawn.  Lesion location was approximated using fluoroscopy.  Local registration and targeting was performed using GE mobile C-arm three-dimensional imaging and radial EBUS. Under fluoroscopic guidance transbronchial needle brushings, transbronchial needle biopsies, and transbronchial forceps biopsies were performed to be sent for cytology and pathology.  Needle-in-lesion was confirmed using GE mobile C-arm. Under fluoroscopic guidance a single fiducial marker was placed adjacent to the nodule.  EBUS survey was performed at level 7, 4L and 11L all of which were small, non concerning and not amenable for biopsy.  At the end of the procedure a general airway inspection was performed and there was no evidence of active bleeding. The bronchoscope was removed.  The patient tolerated the procedure well. There was no significant blood loss and there were no obvious complications. A post-procedural chest x-ray is pending.  Samples Target #1: 1. Transbronchial needle brushings from Left upper lobe nodule 2. Transbronchial Wang needle biopsies from Left upper lobe nodule. 3. Transbronchial forceps biopsies from Left upper lobe nodule  Plans:  The patient will be discharged from the PACU to home when recovered from anesthesia and after chest x-ray is reviewed. We will review the cytology, pathology and microbiology results with the patient when they become available. Outpatient followup will be with Myself.  Darrin Barn, MD [facility] 01/29/2024 10:25 AM      Tool-in-lesion left upper lobe nodule.

## 2024-01-29 NOTE — Transfer of Care (Signed)
 Immediate Anesthesia Transfer of Care Note  Patient: Luis Haney  Procedure(s) Performed: VIDEO BRONCHOSCOPY WITH ENDOBRONCHIAL NAVIGATION (Left) BRONCHOSCOPY, WITH EBUS (Bilateral)  Patient Location: PACU  Anesthesia Type:General  Level of Consciousness: awake, alert , and oriented  Airway & Oxygen Therapy: Patient Spontanous Breathing and Patient connected to face mask oxygen  Post-op Assessment: Report given to RN and Post -op Vital signs reviewed and stable  Post vital signs: stable  Last Vitals:  Vitals Value Taken Time  BP 106/52 01/29/24 10:30  Temp    Pulse 58 01/29/24 10:34  Resp 21 01/29/24 10:34  SpO2 100 % 01/29/24 10:34  Vitals shown include unfiled device data.  Last Pain:  Vitals:   01/29/24 0819  TempSrc: Temporal  PainSc: 0-No pain         Complications: No notable events documented.

## 2024-01-29 NOTE — Anesthesia Procedure Notes (Signed)
 Procedure Name: Intubation Date/Time: 01/29/2024 9:17 AM  Performed by: Gillermo Spruce I, CRNAPre-anesthesia Checklist: Patient identified, Emergency Drugs available, Suction available and Patient being monitored Patient Re-evaluated:Patient Re-evaluated prior to induction Oxygen Delivery Method: Circle system utilized Preoxygenation: Pre-oxygenation with 100% oxygen Induction Type: IV induction Ventilation: Mask ventilation without difficulty Laryngoscope Size: McGrath and 4 Grade View: Grade I Tube type: Oral Tube size: 8.5 mm Number of attempts: 1 Airway Equipment and Method: Stylet and Oral airway Placement Confirmation: ETT inserted through vocal cords under direct vision, positive ETCO2 and breath sounds checked- equal and bilateral Secured at: 23 cm Tube secured with: Tape Dental Injury: Teeth and Oropharynx as per pre-operative assessment

## 2024-01-29 NOTE — Telephone Encounter (Signed)
Abx prescription

## 2024-01-29 NOTE — Anesthesia Preprocedure Evaluation (Signed)
 Anesthesia Evaluation  Patient identified by MRN, date of birth, ID band Patient awake    Reviewed: Allergy & Precautions, NPO status , Patient's Chart, lab work & pertinent test results  History of Anesthesia Complications Negative for: history of anesthetic complications  Airway Mallampati: III   Neck ROM: Full    Dental  (+) Missing   Pulmonary COPD, former smoker (quit 2021) Lung CA   Pulmonary exam normal breath sounds clear to auscultation       Cardiovascular negative cardio ROS Normal cardiovascular exam Rhythm:Regular Rate:Normal  ECG 01/28/24:  Sinus bradycardia Minimal voltage criteria for LVH, may be normal variant ( R in aVL )   Neuro/Psych negative neurological ROS     GI/Hepatic negative GI ROS, Neg liver ROS,,,  Endo/Other  negative endocrine ROS    Renal/GU negative Renal ROS     Musculoskeletal   Abdominal   Peds  Hematology  (+) Blood dyscrasia, anemia   Anesthesia Other Findings   Reproductive/Obstetrics                              Anesthesia Physical Anesthesia Plan  ASA: 2  Anesthesia Plan: General   Post-op Pain Management:    Induction: Intravenous  PONV Risk Score and Plan: 2 and Ondansetron , Dexamethasone  and Treatment may vary due to age or medical condition  Airway Management Planned: Oral ETT  Additional Equipment:   Intra-op Plan:   Post-operative Plan: Extubation in OR  Informed Consent: I have reviewed the patients History and Physical, chart, labs and discussed the procedure including the risks, benefits and alternatives for the proposed anesthesia with the patient or authorized representative who has indicated his/her understanding and acceptance.     Dental advisory given  Plan Discussed with: CRNA  Anesthesia Plan Comments: (Patient consented for risks of anesthesia including but not limited to:  - adverse reactions to  medications - damage to eyes, teeth, lips or other oral mucosa - nerve damage due to positioning  - sore throat or hoarseness - damage to heart, brain, nerves, lungs, other parts of body or loss of life  Informed patient about role of CRNA in peri- and intra-operative care.  Patient voiced understanding.)        Anesthesia Quick Evaluation

## 2024-01-29 NOTE — Anesthesia Postprocedure Evaluation (Signed)
 Anesthesia Post Note  Patient: Luis Haney  Procedure(s) Performed: VIDEO BRONCHOSCOPY WITH ENDOBRONCHIAL NAVIGATION (Left) BRONCHOSCOPY, WITH EBUS (Bilateral)  Patient location during evaluation: PACU Anesthesia Type: General Level of consciousness: awake and alert, oriented and patient cooperative Pain management: pain level controlled Vital Signs Assessment: post-procedure vital signs reviewed and stable Respiratory status: spontaneous breathing, nonlabored ventilation and respiratory function stable Cardiovascular status: blood pressure returned to baseline and stable Postop Assessment: adequate PO intake Anesthetic complications: no   No notable events documented.   Last Vitals:  Vitals:   01/29/24 0819 01/29/24 1030  BP: 134/72 (!) 106/52  Pulse: 60 (!) 59  Resp: 17 17  Temp: 36.5 C (!) 35.9 C  SpO2: 100% 100%    Last Pain:  Vitals:   01/29/24 1030  TempSrc:   PainSc: 0-No pain                 Alfonso Ruths

## 2024-01-29 NOTE — Addendum Note (Signed)
 Addendum  created 01/29/24 1230 by Gillermo Spruce I, CRNA   Intraprocedure Meds edited

## 2024-01-29 NOTE — Interval H&P Note (Signed)
 History and Physical Interval Note:  01/29/2024 8:56 AM  Luis Haney  has presented today for surgery, with the diagnosis of Left upper lobe nodule.  The various methods of treatment have been discussed with the patient and family. After consideration of risks, benefits and other options for treatment, the patient has consented to  Procedure(s): VIDEO BRONCHOSCOPY WITH ENDOBRONCHIAL NAVIGATION (Left) BRONCHOSCOPY, WITH EBUS (Bilateral) as a surgical intervention.  The patient's history has been reviewed, patient examined, no change in status, stable for surgery.  I have reviewed the patient's chart and labs.  Questions were answered to the patient's satisfaction.     [name]

## 2024-01-30 ENCOUNTER — Encounter: Payer: Self-pay | Admitting: Pulmonary Disease

## 2024-01-31 ENCOUNTER — Ambulatory Visit: Payer: Self-pay | Admitting: Pulmonary Disease

## 2024-01-31 LAB — CYTOLOGY - NON PAP

## 2024-01-31 LAB — SURGICAL PATHOLOGY

## 2024-02-03 ENCOUNTER — Other Ambulatory Visit: Payer: Self-pay | Admitting: Internal Medicine

## 2024-02-03 DIAGNOSIS — C3411 Malignant neoplasm of upper lobe, right bronchus or lung: Secondary | ICD-10-CM

## 2024-02-05 ENCOUNTER — Telehealth: Payer: Self-pay | Admitting: Radiation Oncology

## 2024-02-05 NOTE — Telephone Encounter (Signed)
 Left message for patient to call back to schedule consult per 6/17 referral.

## 2024-02-05 NOTE — Progress Notes (Signed)
 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  He underwent SBRT in April 2022.  A recent CT scan revealed an enlarging nodule in the left upper lobe of the lung.   CT Super D Chest 01/28/2024: Stable post treatment changes involving the right upper lobe medially with dense radiation fibrosis.  Stable 9 x 7 mm left upper lobe pulmonary nodule. This was hypermetabolic on the recent PET-CT and consistent with a small pulmonary neoplasm.  PET 12/27/2023: The left upper lobe nodule of concern has a maximum SUV of 3.2, which given its small but increasing size is suspicious for malignancy.  No findings of hypermetabolic metastatic disease.   CT Chest 12/11/2023:  Interval progression of left upper lobe pulmonary nodule of concern, measuring 9 x 7 mm today compared to 6 mm previously.  Continued interval progression heightens concern for metachronous primary neoplasm or metastatic disease.  Stable post treatment scarring in the suprahilar medial right upper lobe.  Biopsies of Left Upper Lobe Lung 01/29/2024   Past/Anticipated interventions by pulmonary, if any:  Past/Anticipated interventions by cardiothoracic surgery, if any:   Past/Anticipated interventions by medical oncology, if any:  Dr. Sherrod 01/08/2024 - Refer to pulmonologist for biopsy of the left upper lobe nodule - If biopsy confirms cancer, refer to Dr. Dewey for radiation treatment - If biopsy indicates inflammation, continue observation   Tobacco/Marijuana/Snuff/ETOH use:   Signs/Symptoms Weight changes, if any:  Respiratory complaints, if any:  Hemoptysis, if any:  Pain issues, if any:    SAFETY ISSUES: Prior radiation? [facility] Lung 2022 SBRT, 3 fractions with Dr. Dewey Pacemaker/ICD?   Possible current pregnancy? Is the patient on methotrexate?   Current Complaints / other details:

## 2024-02-06 ENCOUNTER — Other Ambulatory Visit: Payer: Self-pay | Admitting: Physician Assistant

## 2024-02-06 DIAGNOSIS — C3411 Malignant neoplasm of upper lobe, right bronchus or lung: Secondary | ICD-10-CM

## 2024-02-07 ENCOUNTER — Encounter: Payer: Self-pay | Admitting: Radiation Oncology

## 2024-02-07 ENCOUNTER — Telehealth: Payer: Self-pay

## 2024-02-07 ENCOUNTER — Ambulatory Visit
Admission: RE | Admit: 2024-02-07 | Discharge: 2024-02-07 | Disposition: A | Discharge status: Home / Self Care | Source: Ambulatory Visit | Attending: Radiation Oncology | Admitting: Radiation Oncology

## 2024-02-07 VITALS — Ht 73.0 in | Wt 217.0 lb

## 2024-02-07 DIAGNOSIS — E785 Hyperlipidemia, unspecified: Secondary | ICD-10-CM | POA: Diagnosis not present

## 2024-02-07 DIAGNOSIS — D649 Anemia, unspecified: Secondary | ICD-10-CM | POA: Diagnosis not present

## 2024-02-07 DIAGNOSIS — J449 Chronic obstructive pulmonary disease, unspecified: Secondary | ICD-10-CM | POA: Diagnosis not present

## 2024-02-07 DIAGNOSIS — Z87891 Personal history of nicotine dependence: Secondary | ICD-10-CM | POA: Diagnosis not present

## 2024-02-07 DIAGNOSIS — J439 Emphysema, unspecified: Secondary | ICD-10-CM | POA: Diagnosis not present

## 2024-02-07 DIAGNOSIS — I700 Atherosclerosis of aorta: Secondary | ICD-10-CM | POA: Insufficient documentation

## 2024-02-07 DIAGNOSIS — C3411 Malignant neoplasm of upper lobe, right bronchus or lung: Secondary | ICD-10-CM

## 2024-02-07 DIAGNOSIS — F17210 Nicotine dependence, cigarettes, uncomplicated: Secondary | ICD-10-CM | POA: Insufficient documentation

## 2024-02-07 DIAGNOSIS — Z79899 Other long term (current) drug therapy: Secondary | ICD-10-CM | POA: Insufficient documentation

## 2024-02-07 NOTE — Progress Notes (Signed)
 Radiation Oncology         [phone]) [phone]    Outpatient ReConsultation - Conducted via telephone at patient request.  I spoke with the patient to conduct this consult visit via telephone. The patient was notified in advance and was offered an in person or telemedicine meeting to allow for face to face communication but instead preferred to proceed with a telephone consult.   Name: Luis Haney        MRN: [id number]  Date of Service: 02/07/2024 DOB: 07-28-1950  RR:[facility]  Sherrod Sherrod, MD     REFERRING PHYSICIAN: Sherrod Sherrod, MD   DIAGNOSIS: The encounter diagnosis was Malignant neoplasm of upper lobe of right lung (HCC).   HISTORY OF PRESENT ILLNESS: Luis Haney is a 73 y.o. male known to our clinic with a history of early-stage lung cancer involving right upper lobe.  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  The procedure was subsequently aborted.  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  Given the patient's pulmonary function, and findings at the time of his most recent surgery, he decided to proceed with stereotactic body radiotherapy which he completed on 11/09/2020.  He has been followed in surveillance with Dr. Sherrod in recent CT chest with contrast on 12/11/2023 showed an increase in the nodule that had previously been followed and had been stable, and this was located in the left upper lobe measuring up to 9 mm in  comparison to 6 mm from January 2025.  Stable posttreatment changes were also noted in the suprahilar medial right upper lobe region consistent with postradiation effect, and stigmata of atherosclerosis and emphysema.  He underwent a PET scan on 12/27/2023 which showed an SUV in the left upper lobe nodule of 3.2 with a mediastinal blood pool of 2.7, there was also inflammatory appearing changes including tree-in-bud changes of the right upper lobe and consolidation in the right lower lobe with an SUV as high as 9.1, he also had bandlike airspace opacity posterior in the left lower lobe also concerning for infectious change in distal physiologic activity with an SUV of 4.7 in the esophagus.  He underwent CT CAP scan on 01/28/2024 in preparation for bronchoscopy.  This showed stable posttreatment changes in the right upper lobe, persistence of the left upper lobe nodule measuring 9 mm, persistent but improving bilateral lobe pulmonary infiltrates and scattered tree-in-bud opacities, stable scattered borderline mediastinal and hilar lymph nodes were noted. 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    PREVIOUS RADIATION THERAPY: .  11/02/20-11/09/20: SBRT Treatment Site/dose:   The tumor in the RUL was treated with a course of stereotactic body radiation treatment. The patient received 54 Gy In 3 fractions at 18 G per fraction.   PAST MEDICAL HISTORY:  Past  Medical History:  Diagnosis Date   Anemia    COPD (chronic obstructive pulmonary disease) (HCC)    Dyspnea    occasuional with exertion   History of pleural effusion 2021   Hyperlipidemia    Left upper lobe pulmonary nodule 2025   Lung cancer (HCC) 09/2020    Neutrophilic leukocytosis    Pneumonia 12/27/2023   completed abx-resolved as of 01-23-24 per pt   Pulmonary nodule, right    Tobacco abuse disorder        PAST SURGICAL HISTORY: Past Surgical History:  Procedure Laterality Date   BACK SURGERY  2000   Lumbar   BRONCHIAL BIOPSY  10/07/2020   Procedure: BRONCHIAL BIOPSIES;  Surgeon: Shelah Lamar RAMAN, MD;  Location: [facility] ENDOSCOPY;  Service: Pulmonary;;   BRONCHIAL BRUSHINGS  10/07/2020   Procedure: BRONCHIAL BRUSHINGS;  Surgeon: Shelah Lamar RAMAN, MD;  Location: [facility] ENDOSCOPY;  Service: Pulmonary;;   BRONCHIAL NEEDLE ASPIRATION BIOPSY  10/07/2020   Procedure: BRONCHIAL NEEDLE ASPIRATION BIOPSIES;  Surgeon: Shelah Lamar RAMAN, MD;  Location: [facility] ENDOSCOPY;  Service: Pulmonary;;   CHEST TUBE INSERTION Right 06/2020   CHEST TUBE INSERTION Right 10/07/2020   NECK SURGERY  2012   SUBMUCOSAL INJECTION  10/07/2020   Procedure: FIDUCIAL DYE MARKING;  Surgeon: Shelah Lamar RAMAN, MD;  Location: [facility] ENDOSCOPY;  Service: Pulmonary;;   TONSILLECTOMY     removed as a chld   VIDEO BRONCHOSCOPY WITH ENDOBRONCHIAL NAVIGATION N/A 10/07/2020   Procedure: VIDEO BRONCHOSCOPY WITH ENDOBRONCHIAL NAVIGATION;  Surgeon: Shelah Lamar RAMAN, MD;  Location: [facility] ENDOSCOPY;  Service: Pulmonary;  Laterality: N/A;   VIDEO BRONCHOSCOPY WITH ENDOBRONCHIAL NAVIGATION Left 01/29/2024   Procedure: VIDEO BRONCHOSCOPY WITH ENDOBRONCHIAL NAVIGATION;  Surgeon: Malka Domino, MD;  Location: [facility] ORS;  Service: Pulmonary;  Laterality: Left;   VIDEO BRONCHOSCOPY WITH ENDOBRONCHIAL ULTRASOUND Bilateral 01/29/2024   Procedure: BRONCHOSCOPY, WITH EBUS;  Surgeon: Malka Domino, MD;  Location: [facility] ORS;  Service: Pulmonary;  Laterality: Bilateral;     FAMILY HISTORY:  Family History  Problem Relation Age of Onset   Heart disease Mother    Other Father        old age     SOCIAL HISTORY:  reports that he quit smoking about 3 years ago. His smoking use included cigarettes. He started  smoking about 33 years ago. He has a 60 pack-year smoking history. He has never used smokeless tobacco. He reports current alcohol use. He reports that he does not currently use drugs. The patient is married and lives in [place].    ALLERGIES: Patient has no known allergies.   MEDICATIONS:  Current Outpatient Medications  Medication Sig Dispense Refill   albuterol  (VENTOLIN  HFA) 108 (90 Base) MCG/ACT inhaler Inhale 2 puffs into the lungs every 4 (four) hours as needed for wheezing or shortness of breath. 8 g 11   atorvastatin (LIPITOR) 20 MG tablet Take 20 mg by mouth every morning.     calcium  carbonate (OS-CAL - DOSED IN MG OF ELEMENTAL CALCIUM ) 1250 (500 Ca) MG tablet Take 1 tablet by mouth 3 (three) times daily with meals.     Fluticasone -Umeclidin-Vilant (TRELEGY ELLIPTA ) 200-62.5-25 MCG/ACT AEPB Inhale 1 puff into the lungs daily. (Patient taking differently: Inhale 1 puff into the lungs every morning.) 3 each 6   Multiple Vitamin (MULTI VITAMIN MENS PO) Take 1 tablet by mouth daily.     Potassium 99 MG TABS Take 99 mg by mouth daily.     No current facility-administered medications for this visit.  Facility-Administered Medications Ordered in Other Visits  Medication Dose Route Frequency Provider Last Rate Last Admin   albuterol  (PROVENTIL ) (2.5 MG/3ML) 0.083% nebulizer solution 2.5 mg  2.5 mg Nebulization Once Hope Almarie ORN, NP         REVIEW OF SYSTEMS: On review of systems, the patient reports that he is doing well overall. He reports he is not using any supplemental oxygen. He's had shortness of breath when mowing the yard and this has been his baseline since 2022. He denies any chest pain, nor any shortness of breath at rest. No other complaints are verbalized.      PHYSICAL EXAM:   Unable to assess due to encounter type.   ECOG = 0  0 - Asymptomatic (Fully active, able to carry on all predisease activities without restriction)  1 - Symptomatic but completely  ambulatory (Restricted in physically strenuous activity but ambulatory and able to carry out work of a light or sedentary nature. For example, light housework, office work)  2 - Symptomatic, <50% in bed during the day (Ambulatory and capable of all self care but unable to carry out any work activities. Up and about more than 50% of waking hours)  3 - Symptomatic, >50% in bed, but not bedbound (Capable of only limited self-care, confined to bed or chair 50% or more of waking hours)  4 - Bedbound (Completely disabled. Cannot carry on any self-care. Totally confined to bed or chair)  5 - Death   Raylene MM, Creech RH, Tormey DC, et al. [phone]). Toxicity and response criteria of the [facility]. Am. DOROTHA Bridges. Oncol. 5 (6): 649-55    LABORATORY DATA:  Lab Results  Component Value Date   WBC 8.2 12/11/2023   HGB 12.1 (L) 12/11/2023   HCT 37.6 (L) 12/11/2023   MCV 96.4 12/11/2023   PLT 226 12/11/2023   Lab Results  Component Value Date   NA 140 12/11/2023   K 4.7 12/11/2023   CL 107 12/11/2023   CO2 29 12/11/2023   Lab Results  Component Value Date   ALT 13 12/11/2023   AST 16 12/11/2023   ALKPHOS 58 12/11/2023   BILITOT 0.5 12/11/2023      RADIOGRAPHY: DG Chest Port 1 View Result Date: 01/29/2024 CLINICAL DATA:  [id number] S/P bronchoscopy [id number] EXAM: PORTABLE CHEST 1 VIEW COMPARISON:  12/03/2020. FINDINGS: Note is made of elevated right hemidiaphragm. Since the prior study, there is a new biopsy clip/fiducial marker overlying the left upper lung zone, likely related to provided history of bronchoscopy. Bilateral lung fields are otherwise clear. No pneumothorax on either side. Bilateral costophrenic angles are clear. Normal cardio-mediastinal silhouette. No acute osseous abnormalities. The soft tissues are within normal limits. IMPRESSION: No acute cardiopulmonary abnormality.  No pneumothorax. Electronically Signed   By: Ree Molt M.D.   On: 01/29/2024 11:25    DG C-Arm 1-60 Min-No Report Result Date: 01/29/2024 Fluoroscopy was utilized by the requesting physician.  No radiographic interpretation.   CT SUPER D CHEST WO CONTRAST Result Date: 01/28/2024 CLINICAL DATA:  Lung cancer. History of right upper lobe squamous cell carcinoma status post SBRT. Enlarging left upper lobe pulmonary nodule. EXAM: CT CHEST WITHOUT CONTRAST TECHNIQUE: Multidetector CT imaging of the chest was performed using thin slice collimation for electromagnetic bronchoscopy planning purposes, without intravenous contrast. RADIATION DOSE REDUCTION: This exam was performed according to the departmental dose-optimization program which includes automated exposure control, adjustment of the mA and/or kV according to patient size and/or use  of iterative reconstruction technique. COMPARISON:  PET-CT 12/27/2023 and chest CT 12/11/2023 FINDINGS: Cardiovascular: The heart is normal in size. No pericardial effusion. The aorta is normal in caliber. Stable scattered atherosclerotic calcifications. Stable three-vessel coronary artery calcifications. Mediastinum/Nodes: Stable scattered borderline mediastinal and hilar lymph nodes. No new or progressive findings. The esophagus is grossly normal. Lungs/Pleura: Stable post treatment changes involving the right upper lobe medially with dense radiation fibrosis. Stable 9 x 7 mm left upper lobe pulmonary nodule on image 36/4. This was hypermetabolic on the recent PET-CT. Persistent but improving bilateral lower lobe pulmonary infiltrates. Scattered tree-in-bud type opacities likely reflecting bronchiolitis or atypical infection. Stable underlying emphysematous changes and pulmonary scarring. Very small right pleural effusion. Upper Abdomen: No significant upper abdominal findings. No hepatic or adrenal gland lesions to suggest metastatic disease. Stable aortic and branch vessel calcifications. Musculoskeletal: No significant bony findings. IMPRESSION: 1. Stable post  treatment changes involving the right upper lobe medially with dense radiation fibrosis. 2. Stable 9 x 7 mm left upper lobe pulmonary nodule. This was hypermetabolic on the recent PET-CT and consistent with a small pulmonary neoplasm. 3. Persistent but improving bilateral lower lobe pulmonary infiltrates. Scattered tree-in-bud type opacities likely reflecting bronchiolitis or atypical infection. 4. Stable scattered borderline mediastinal and hilar lymph nodes. 5. Stable emphysematous changes and pulmonary scarring. 6. Stable three-vessel coronary artery calcifications. Aortic Atherosclerosis (ICD10-I70.0) and Emphysema (ICD10-J43.9). Electronically Signed   By: MYRTIS Stammer M.D.   On: 01/28/2024 22:12       IMPRESSION/PLAN: 1. Stage IA1, cT1aN0M0, NSCLC, squamous cell carcinoma of the LUL.  The patient as above is known to us  and we reviewed his course since his last visit and treatment with us .  We discussed the new finding in the left upper lobe with early stage cancer identified in that location. He was offered re-evaluation with Dr. Kerrin to see if surgical resection could be considered, but given his previously complicated course surgically in the past and pulmonary function at baseline, the patient is not interested in considering surgery to treat this new cancer. We discussed alternative treatment would include stereotactic body radiotherapy (SBRT) as he received for the RUL in 2022.  We reviewed the delivery and logistics of treatment as well as risks, benefits, short and long-term effects of treatment and Dr. Dewey would recommend a 3-5 fraction course of the style of radiation to the left upper lobe. The patient is interested in proceeding and would like to avoid invasive procedures.  The patient will be contacted to coordinate treatment planning by our simulation department 2. Stage IA2, cT1bN0M0, NSCLC, squamous cell carcinoma of the RUL.  The patient will follow-up in surveillance after  treatment of #1 but appears to remain in surveillance based on his recent scans.  This encounter was conducted via telephone.  The patient has provided two factor identification and has given verbal consent for this type of encounter and has been advised to only accept a meeting of this type in a secure network environment. The time spent during this encounter was 60 minutes including preparation, discussion, and coordination of the patient's care. The attendants for this meeting include Luis Cary, RN, Dr. Dewey, Luis Haney  and Luis Haney and Luis Haney.  During the encounter,  Luis Cary, RN, Dr. Dewey, and Hilliard Estefana Haney were located at [facility] Radiation Oncology Department.  Luis Haney was located at home with his wife Luis Haney.     The above documentation reflects my  direct findings during this shared patient visit. Please see the separate note by Dr. Dewey on this date for the remainder of the patient's plan of care.    Jakie KYM Haney, [facility]   **Disclaimer: This note was dictated with voice recognition software. Similar sounding words can inadvertently be transcribed and this note may contain transcription errors which may not have been corrected upon publication of note.**

## 2024-02-07 NOTE — Telephone Encounter (Signed)
 Spoke with patient regarding upcoming appointments following completion of radiation. Informed patient that he will proceed with a scan approximately 4 months after finishing radiation, followed by a follow-up visit with Dr. Sherrod about one week after the scan to review the results. Patient voiced understanding.

## 2024-02-08 ENCOUNTER — Telehealth: Payer: Self-pay | Admitting: Internal Medicine

## 2024-02-08 NOTE — Telephone Encounter (Signed)
 Scheduled appointments with the patient around a CT scan expected date per staff message.

## 2024-02-13 ENCOUNTER — Ambulatory Visit
Admission: RE | Admit: 2024-02-13 | Discharge: 2024-02-13 | Disposition: A | Discharge status: Home / Self Care | Source: Ambulatory Visit | Attending: Radiation Oncology | Admitting: Radiation Oncology

## 2024-02-13 DIAGNOSIS — Z87891 Personal history of nicotine dependence: Secondary | ICD-10-CM | POA: Diagnosis not present

## 2024-02-13 DIAGNOSIS — C3412 Malignant neoplasm of upper lobe, left bronchus or lung: Secondary | ICD-10-CM | POA: Diagnosis present

## 2024-02-19 DIAGNOSIS — C3412 Malignant neoplasm of upper lobe, left bronchus or lung: Secondary | ICD-10-CM | POA: Diagnosis not present

## 2024-02-26 ENCOUNTER — Ambulatory Visit
Admission: RE | Admit: 2024-02-26 | Discharge: 2024-02-26 | Disposition: A | Discharge status: Home / Self Care | Source: Ambulatory Visit | Attending: Radiation Oncology | Admitting: Radiation Oncology

## 2024-02-26 ENCOUNTER — Other Ambulatory Visit: Payer: Self-pay

## 2024-02-26 DIAGNOSIS — Z87891 Personal history of nicotine dependence: Secondary | ICD-10-CM | POA: Insufficient documentation

## 2024-02-26 DIAGNOSIS — C3412 Malignant neoplasm of upper lobe, left bronchus or lung: Secondary | ICD-10-CM | POA: Insufficient documentation

## 2024-02-26 LAB — RAD ONC ARIA SESSION SUMMARY
Course Elapsed Days: 0
Plan Fractions Treated to Date: 1
Plan Prescribed Dose Per Fraction: 18 Gy
Plan Total Fractions Prescribed: 3
Plan Total Prescribed Dose: 54 Gy
Reference Point Dosage Given to Date: 18 Gy
Reference Point Session Dosage Given: 18 Gy
Session Number: 1

## 2024-02-27 ENCOUNTER — Ambulatory Visit: Admitting: Radiation Oncology

## 2024-02-28 ENCOUNTER — Other Ambulatory Visit: Payer: Self-pay

## 2024-02-28 ENCOUNTER — Ambulatory Visit
Admission: RE | Admit: 2024-02-28 | Discharge: 2024-02-28 | Disposition: A | Discharge status: Home / Self Care | Source: Ambulatory Visit | Attending: Radiation Oncology | Admitting: Radiation Oncology

## 2024-02-28 DIAGNOSIS — C3412 Malignant neoplasm of upper lobe, left bronchus or lung: Secondary | ICD-10-CM | POA: Diagnosis not present

## 2024-02-28 LAB — RAD ONC ARIA SESSION SUMMARY
Course Elapsed Days: 2
Plan Fractions Treated to Date: 2
Plan Prescribed Dose Per Fraction: 18 Gy
Plan Total Fractions Prescribed: 3
Plan Total Prescribed Dose: 54 Gy
Reference Point Dosage Given to Date: 36 Gy
Reference Point Session Dosage Given: 18 Gy
Session Number: 2

## 2024-03-03 ENCOUNTER — Telehealth: Payer: Self-pay

## 2024-03-03 NOTE — Telephone Encounter (Signed)
 LVM for pt regarding his Cancer Claim form being completed, and faxed. Will f/u with pt tomorrow.

## 2024-03-04 ENCOUNTER — Ambulatory Visit
Admission: RE | Admit: 2024-03-04 | Discharge: 2024-03-04 | Disposition: A | Discharge status: Home / Self Care | Source: Ambulatory Visit | Attending: Radiation Oncology | Admitting: Radiation Oncology

## 2024-03-04 ENCOUNTER — Other Ambulatory Visit: Payer: Self-pay

## 2024-03-04 DIAGNOSIS — C3412 Malignant neoplasm of upper lobe, left bronchus or lung: Secondary | ICD-10-CM | POA: Diagnosis not present

## 2024-03-04 LAB — RAD ONC ARIA SESSION SUMMARY
Course Elapsed Days: 7
Plan Fractions Treated to Date: 3
Plan Prescribed Dose Per Fraction: 18 Gy
Plan Total Fractions Prescribed: 3
Plan Total Prescribed Dose: 54 Gy
Reference Point Dosage Given to Date: 54 Gy
Reference Point Session Dosage Given: 18 Gy
Session Number: 3

## 2024-03-04 NOTE — Progress Notes (Signed)
  Radiation Oncology         [phone] ________________________________  Name: Luis Haney MRN: [id number]  Date: 03/04/2024  DOB: [birth year]  End of Treatment Note  Diagnosis:     Stage IA1, cT1aN0M0, NSCLC, squamous cell carcinoma of the LUL   Indication for treatment:  Curative       Radiation treatment dates:   02/26/24-03/04/24  Site/dose:   The tumor in the LUL was treated with a course of stereotactic body radiation treatment. The patient received 54 Gy In 3 fractions at 18 G per fraction.  Narrative: The patient tolerated radiation treatment relatively well.   The patient did not have any signs of acute toxicity during treatment.  Plan: The patient has completed radiation treatment. The patient will receive a call in about one month from the radiation oncology department. He will continue follow up with Dr. Sherrod in surveillance as well for this and his previously history of early stage lung cancer.     Mavis KYM Husband, PAC

## 2024-03-05 NOTE — Radiation Completion Notes (Signed)
 Patient Name: COSME, JACOB MRN: [id number] Date of Birth: 1951/02/24 Referring Physician: SHERROD SHERROD, M.D. Date of Service: 2024-03-05 Radiation Oncologist: Norleen Limes, M.D. [facility] -                              RADIATION ONCOLOGY END OF TREATMENT NOTE     Diagnosis: C34.11 Malignant neoplasm of upper lobe, right bronchus or lung Staging on 2020-12-21: Malignant neoplasm of upper lobe of right lung (HCC) T=cT1b, N=cN0, M=cM0 Intent: Curative     ==========DELIVERED PLANS==========  First Treatment Date: 2024-02-26 Last Treatment Date: 2024-03-04   Plan Name: Lung_L_SBRT Site: Lung, Left Technique: SBRT/SRT-IMRT Mode: Photon Dose Per Fraction: 18 Gy Prescribed Dose (Delivered / Prescribed): 54 Gy / 54 Gy Prescribed Fxs (Delivered / Prescribed): 3 / 3     ==========ON TREATMENT VISIT DATES========== 2024-02-26, 2024-02-28, 2024-03-04, 2024-03-04     ==========UPCOMING VISITS========== 05/29/2024 [facility]  04/29/2024 [facility] OFFICE VISIT - LINZIE Malka Domino, MD  04/07/2024 [facility] POST TREATMENT CALL [facility]        ==========APPENDIX - ON TREATMENT VISIT NOTES==========   See weekly On Treatment Notes in Epic for details in the Media tab (listed as Progress notes on the On Treatment Visit Dates listed above).

## 2024-03-11 ENCOUNTER — Telehealth: Payer: Self-pay

## 2024-03-11 NOTE — Telephone Encounter (Signed)
 Pt called stating that his Cancer claim was missing documents. I let him know that he would have to reach out to billing and get those items that he was missing. I was able to give him and his wife billings number, so they could call. Pt verbalized understanding.

## 2024-04-07 ENCOUNTER — Ambulatory Visit
Admission: RE | Admit: 2024-04-07 | Discharge: 2024-04-07 | Disposition: A | Discharge status: Home / Self Care | Source: Ambulatory Visit | Attending: Internal Medicine | Admitting: Internal Medicine

## 2024-04-07 DIAGNOSIS — C3412 Malignant neoplasm of upper lobe, left bronchus or lung: Secondary | ICD-10-CM | POA: Insufficient documentation

## 2024-04-07 DIAGNOSIS — Z87891 Personal history of nicotine dependence: Secondary | ICD-10-CM | POA: Insufficient documentation

## 2024-04-07 NOTE — Progress Notes (Signed)
  Radiation Oncology         [phone] ________________________________  Name: Luis Haney MRN: [id number]  Date of Service: 04/07/2024  DOB: February 02, 1951  Post Treatment Telephone Note  Diagnosis:   Malignant neoplasm of upper lobe, right bronchus or lung Staging on 2020-12-21: Malignant neoplasm of upper lobe of right lung (HCC) T=cT1b, N=cN0, M=cM0   The patient was available for call today.   Symptoms of fatigue have improved since completing therapy.  Symptoms of skin changes have improved since completing therapy.  Symptoms of esophagitis have improved since completing therapy.  Patient denies cough or SOB.  The patient has scheduled follow up with his medical oncologist Dr. Sherrod 06/05/2024 for ongoing care, and was encouraged to call if he  develops concerns or questions regarding radiation.  He is scheduled for CT Chest 05/29/2024.

## 2024-04-29 ENCOUNTER — Ambulatory Visit: Admitting: Pulmonary Disease

## 2024-04-29 ENCOUNTER — Encounter: Payer: Self-pay | Admitting: Pulmonary Disease

## 2024-04-29 VITALS — BP 118/60 | HR 57 | Temp 97.5°F | Ht 73.0 in | Wt 217.2 lb

## 2024-04-29 DIAGNOSIS — R911 Solitary pulmonary nodule: Secondary | ICD-10-CM | POA: Diagnosis not present

## 2024-04-29 DIAGNOSIS — J4489 Other specified chronic obstructive pulmonary disease: Secondary | ICD-10-CM

## 2024-04-29 DIAGNOSIS — J45909 Unspecified asthma, uncomplicated: Secondary | ICD-10-CM

## 2024-04-29 MED ORDER — TRELEGY ELLIPTA 100-62.5-25 MCG/ACT IN AEPB
1.0000 | INHALATION_SPRAY | Freq: Every day | RESPIRATORY_TRACT | 6 refills | Status: AC
Start: 2024-04-29 — End: ?

## 2024-04-29 NOTE — Progress Notes (Signed)
 Synopsis: Referred in by [facility]   Subjective:   PATIENT ID: Luis Haney GENDER: male DOB: 15-Sep-1950, MRN: [id number]  No chief complaint on file.   HPI Luis Haney is a pleasant 73 year old male patient with a past medical history of right upper lobe squamous cell carcinoma status post SBRT in 2022 presenting today to the pulmonary clinic for evaluation of left upper lobe nodule.  He was diagnosed with a right upper lobe squamous cell carcinoma with robotic assisted navigational bronchoscopy and biopsy of right upper lobe nodule in 2022.  He underwent SBRT and has been following radiologically with Dr. Gatha.  New left upper lobe nodule was noticed on his scan in January 2025 this prompted a repeat CT chest in May 2025 that redemonstrated the nodule but with interval progression now measuring 9 mm x 7 mm.  PET scan 12/2023 demonstrated an SUV of 3.2 in the left upper lobe nodule she raise suspicion for new primary malignancy.  PFTs 2022 - Obstructive defect with significant response to bronchodilators. Normal Lung volumes, DLCO mildly reduced. Consistent with COPD and asthma overlap.   Family history - Unknown.   Social history - 1/5 packs per day quit in 2022 started at 72 years old.   OV 10.07.2025 - [name] is here to follow up regarding his COPD. Recently underwent a RANB on 01/2024 for a left upper lobe nodule consistent with SCC. Underwent SBRT. Planned repeat Ct chest in Nov 2025. Regarding his COPD/Asthma overlap - we will continue with Trelegy Ellipta  100 1 puff daily.   ROS All systems were reviewed and are negative except for the above.  Objective:   There were no vitals filed for this visit.    on RA BMI Readings from Last 3 Encounters:  02/07/24 28.63 kg/m  01/16/24 28.66 kg/m  01/08/24 28.96 kg/m   Wt Readings from Last 3 Encounters:  02/07/24 217 lb (98.4 kg)  01/16/24 217 lb 3.2 oz (98.5 kg)  01/08/24 219 lb 8 oz (99.6 kg)     Physical Exam GEN: NAD, Healthy Appearing HEENT: Supple Neck, Reactive Pupils, EOMI  CVS: Normal S1, Normal S2, RRR, No murmurs or ES appreciated  Lungs: Clear bilateral air entry.  Abdomen: Soft, non tender, non distended, + BS  Extremities: Warm and well perfused, No edema  Skin: No suspicious lesions appreciated  Psych: Normal Affect  Ancillary Information   CBC    Component Value Date/Time   WBC 8.2 12/11/2023 0943   WBC 8.9 10/09/2020 0035   RBC 3.90 (L) 12/11/2023 0943   HGB 12.1 (L) 12/11/2023 0943   HCT 37.6 (L) 12/11/2023 0943   PLT 226 12/11/2023 0943   MCV 96.4 12/11/2023 0943   MCH 31.0 12/11/2023 0943   MCHC 32.2 12/11/2023 0943   RDW 13.6 12/11/2023 0943   LYMPHSABS 1.4 12/11/2023 0943   MONOABS 0.5 12/11/2023 0943   EOSABS 0.2 12/11/2023 0943   BASOSABS 0.1 12/11/2023 0943    Labs and imaging were reviewed.     Latest Ref Rng & Units 08/23/2020    2:08 PM  PFT Results  FVC-Predicted Pre % 63  P  FVC-Post L 3.21  P  FVC-Predicted Post % 70  P  Pre FEV1/FVC % % 68  P  Post FEV1/FCV % % 69  P  FEV1-Pre L 1.99  P  FEV1-Predicted Pre % 59  P  FEV1-Post L 2.20  P  DLCO uncorrected ml/min/mmHg 15.21  P  DLCO [facility]% %  57  P  DLVA Predicted % 77  P  TLC L 6.47  P  TLC % Predicted % 89  P  RV % Predicted % 106  P    P Preliminary result     Assessment & Plan:  Luis Haney is a pleasant 74 year old male patient with a past medical history of right upper lobe squamous cell carcinoma status post SBRT in 2022 presenting today to the pulmonary clinic for evaluation of left upper lobe nodule.  #History of RUL SCC s/p SBRT 2022 #New concerning LUL nodule 9mm SUV 3.7. (MAYO 67.4% Prob of malignancy), s/p RANB 01/2024 --> SCC s/p SBRT.   #COPD with asthma overlap.   []  Pending repeat CT chest wo contrast in 05/2024.  []  C/w Trelegy Ellipta  100 1 puff daily.   RTC 6 months.  I personally spent a total of 30 minutes in the care of the patient today  including preparing to see the patient, getting/reviewing separately obtained history, performing a medically appropriate exam/evaluation, counseling and educating, placing orders, documenting clinical information in the EHR, and independently interpreting results.   Darrin Barn, MD [facility] 04/29/2024 3:39 PM

## 2024-05-29 ENCOUNTER — Ambulatory Visit (HOSPITAL_COMMUNITY)
Admission: RE | Admit: 2024-05-29 | Discharge: 2024-05-29 | Disposition: A | Discharge status: Home / Self Care | Source: Ambulatory Visit | Attending: Physician Assistant | Admitting: Physician Assistant

## 2024-05-29 ENCOUNTER — Inpatient Hospital Stay: Attending: Internal Medicine

## 2024-05-29 DIAGNOSIS — C3411 Malignant neoplasm of upper lobe, right bronchus or lung: Secondary | ICD-10-CM | POA: Insufficient documentation

## 2024-05-29 DIAGNOSIS — C3412 Malignant neoplasm of upper lobe, left bronchus or lung: Secondary | ICD-10-CM | POA: Insufficient documentation

## 2024-05-29 DIAGNOSIS — Z720 Tobacco use: Secondary | ICD-10-CM | POA: Insufficient documentation

## 2024-05-29 LAB — CBC WITH DIFFERENTIAL (CANCER CENTER ONLY)
Abs Immature Granulocytes: 0.03 K/uL (ref 0.00–0.07)
Basophils Absolute: 0.1 K/uL (ref 0.0–0.1)
Basophils Relative: 1 %
Eosinophils Absolute: 0.4 K/uL (ref 0.0–0.5)
Eosinophils Relative: 6 %
HCT: 38.3 % — ABNORMAL LOW (ref 39.0–52.0)
Hemoglobin: 12.7 g/dL — ABNORMAL LOW (ref 13.0–17.0)
Immature Granulocytes: 0 %
Lymphocytes Relative: 19 %
Lymphs Abs: 1.4 K/uL (ref 0.7–4.0)
MCH: 31.8 pg (ref 26.0–34.0)
MCHC: 33.2 g/dL (ref 30.0–36.0)
MCV: 95.8 fL (ref 80.0–100.0)
Monocytes Absolute: 0.6 K/uL (ref 0.1–1.0)
Monocytes Relative: 8 %
Neutro Abs: 4.7 K/uL (ref 1.7–7.7)
Neutrophils Relative %: 66 %
Platelet Count: 231 K/uL (ref 150–400)
RBC: 4 MIL/uL — ABNORMAL LOW (ref 4.22–5.81)
RDW: 13.4 % (ref 11.5–15.5)
WBC Count: 7.1 K/uL (ref 4.0–10.5)
nRBC: 0 % (ref 0.0–0.2)

## 2024-05-29 LAB — CMP (CANCER CENTER ONLY)
ALT: 9 U/L (ref 0–44)
AST: 15 U/L (ref 15–41)
Albumin: 4.1 g/dL (ref 3.5–5.0)
Alkaline Phosphatase: 63 U/L (ref 38–126)
Anion gap: 5 (ref 5–15)
BUN: 18 mg/dL (ref 8–23)
CO2: 30 mmol/L (ref 22–32)
Calcium: 9.9 mg/dL (ref 8.9–10.3)
Chloride: 104 mmol/L (ref 98–111)
Creatinine: 1.34 mg/dL — ABNORMAL HIGH (ref 0.61–1.24)
GFR, Estimated: 55 mL/min — ABNORMAL LOW (ref >60)
Glucose, Bld: 103 mg/dL — ABNORMAL HIGH (ref 70–99)
Potassium: 4.5 mmol/L (ref 3.5–5.1)
Sodium: 139 mmol/L (ref 135–145)
Total Bilirubin: 0.6 mg/dL (ref 0.0–1.2)
Total Protein: 7 g/dL (ref 6.5–8.1)

## 2024-05-29 MED ORDER — SODIUM CHLORIDE (PF) 0.9 % IJ SOLN
INTRAMUSCULAR | Status: AC
  Filled 2024-05-29: qty 50

## 2024-05-29 MED ORDER — IOHEXOL 300 MG/ML  SOLN
75.0000 mL | Freq: Once | INTRAMUSCULAR | Status: AC | PRN
  Administered 2024-05-29: 75 mL via INTRAVENOUS

## 2024-06-02 NOTE — Progress Notes (Unsigned)
 Luis [facility]aney OFFICE PROGRESS NOTE  Care, [facility] [address] [place] [address]  DIAGNOSIS:  1) Stage IA (T1b, N0, M0) non-small cell lung cancer, squamous cell carcinoma presented with right upper lobe lung nodule.  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 Dewey completed on November 09, 2020. 2) SBRT to the left upper lobe lung lesion from 02/26/2024 to 03/04/2024 under the care of Dr. Dewey  CURRENT THERAPY: Observation   INTERVAL HISTORY: Luis Haney 74 y.o. male returns to the clinic today for a 16-month follow-up visit. The patient was last seen in clinic on 01/08/24 by Dr. Sherrod.   At that point in time, the patient had a PET scan that showed hypermetabolic activity in the left upper lobe lung nodule and pneumonia in the right lower lobe.  Dr. Sherrod referred the patient to pulmonary medicine for consideration of biopsy of the left upper lobe lung nodule.  If confirmed to be cancer then he would recommend referral to Dr. Dewey for radiation.  This was biopsied on 01/29/2024 and the final pathology showed squamous cell carcinoma. Therefore, he underwent SBRT which was completed on 03/04/24.   The patient is being followed with surveillance imaging for stage Ia non-small cell lung cancer, squamous cell carcinoma.  The patient was initially diagnosed in March 2022 and completed SBRT to this lesion.  Since last being seen, the patient denies any changes in his health.  Denies any fever, chills, night sweats, or unexplained weight loss.  Denies any chest pain or hemoptysis. He may get a cough every now and then due to sinus drainage. He reports stable dyspnea on exertion. He denies any chest discomfort.  He denies any nausea, vomiting, diarrhea, or constipation.  Denies  any headache or visual changes.  He recently had a restaging CT scan performed.  He is here today for evaluation and to review his scan results.       MEDICAL HISTORY: Past Medical History:  Diagnosis Date   Anemia    COPD (chronic obstructive pulmonary disease) (HCC)    Dyspnea    occasuional with exertion   History of pleural effusion 2021   Hyperlipidemia    Left upper lobe pulmonary nodule 2025   Lung cancer (HCC) 09/2020   Neutrophilic leukocytosis    Pneumonia 12/27/2023   completed abx-resolved as of 01-23-24 per pt   Pulmonary nodule, right    Tobacco abuse disorder     ALLERGIES:  has no known allergies.  MEDICATIONS:  Current Outpatient Medications  Medication Sig Dispense Refill   albuterol  (VENTOLIN  HFA) 108 (90 Base) MCG/ACT inhaler Inhale 2 puffs into the lungs every 4 (four) hours as needed for wheezing or shortness of breath. 8 g 11   atorvastatin (LIPITOR) 20 MG tablet Take 20 mg by mouth every morning.     calcium  carbonate (OS-CAL - DOSED IN MG OF ELEMENTAL CALCIUM ) 1250 (500 Ca) MG tablet Take 1 tablet by mouth 3 (three) times daily with meals.     Fluticasone -Umeclidin-Vilant (TRELEGY ELLIPTA ) 100-62.5-25 MCG/ACT AEPB Inhale 1 puff into the lungs daily. 3 each 6   Multiple Vitamin (MULTI VITAMIN MENS PO) Take 1 tablet by mouth daily.     Potassium 99 MG TABS Take 99 mg by mouth  daily.     No current facility-administered medications for this visit.   Facility-Administered Medications Ordered in Other Visits  Medication Dose Route Frequency Provider Last Rate Last Admin   albuterol  (PROVENTIL ) (2.5 MG/3ML) 0.083% nebulizer solution 2.5 mg  2.5 mg Nebulization Once Luis Haney ORN, NP        SURGICAL HISTORY:  Past Surgical History:  Procedure Laterality Date   BACK SURGERY  2000   Lumbar   BRONCHIAL BIOPSY  10/07/2020   Procedure: BRONCHIAL BIOPSIES;  Surgeon: Luis Lamar RAMAN, MD;  Location: [facility] ENDOSCOPY;  Service: Pulmonary;;   BRONCHIAL BRUSHINGS   10/07/2020   Procedure: BRONCHIAL BRUSHINGS;  Surgeon: Luis Lamar RAMAN, MD;  Location: [facility] ENDOSCOPY;  Service: Pulmonary;;   BRONCHIAL NEEDLE ASPIRATION BIOPSY  10/07/2020   Procedure: BRONCHIAL NEEDLE ASPIRATION BIOPSIES;  Surgeon: Luis Lamar RAMAN, MD;  Location: [facility] ENDOSCOPY;  Service: Pulmonary;;   CHEST TUBE INSERTION Right 06/2020   CHEST TUBE INSERTION Right 10/07/2020   NECK SURGERY  2012   SUBMUCOSAL INJECTION  10/07/2020   Procedure: FIDUCIAL DYE MARKING;  Surgeon: Luis Lamar RAMAN, MD;  Location: [facility];  Service: Pulmonary;;   TONSILLECTOMY     removed as a chld   VIDEO BRONCHOSCOPY WITH ENDOBRONCHIAL NAVIGATION N/A 10/07/2020   Procedure: VIDEO BRONCHOSCOPY WITH ENDOBRONCHIAL NAVIGATION;  Surgeon: Luis Lamar RAMAN, MD;  Location: [facility] ENDOSCOPY;  Service: Pulmonary;  Laterality: N/A;   VIDEO BRONCHOSCOPY WITH ENDOBRONCHIAL NAVIGATION Left 01/29/2024   Procedure: VIDEO BRONCHOSCOPY WITH ENDOBRONCHIAL NAVIGATION;  Surgeon: Luis Domino, MD;  Location: [facility] ORS;  Service: Pulmonary;  Laterality: Left;   VIDEO BRONCHOSCOPY WITH ENDOBRONCHIAL ULTRASOUND Bilateral 01/29/2024   Procedure: BRONCHOSCOPY, WITH EBUS;  Surgeon: Luis Domino, MD;  Location: [facility] ORS;  Service: Pulmonary;  Laterality: Bilateral;    REVIEW OF SYSTEMS:   Constitutional: Negative for appetite change, chills, fatigue, fever and unexpected weight change.  HENT: Negative for mouth sores, nosebleeds, sore throat and trouble swallowing.  Eyes: Negative for eye problems and icterus.  Respiratory: Positive for dyspnea exertion and intermittent cough (baseline comes and goes). Negative for hemoptysis, and wheezing.   Cardiovascular: Negative for chest pain and leg swelling.  Gastrointestinal:Negative for abdominal pain, diarrhea, constipation,  nausea and vomiting.  Genitourinary: Negative for bladder incontinence, difficulty urinating, dysuria, frequency and hematuria.   Musculoskeletal: Negative for  back pain, gait problem, neck pain and neck stiffness.  Skin: Negative for itching and rash.  Neurological: Negative for dizziness, extremity weakness, gait problem, headaches, light-headedness and seizures.  Hematological: Negative for adenopathy. Does not bruise/bleed easily.  Psychiatric/Behavioral: Negative for confusion, depression and sleep disturbance. The patient is not nervous/anxious.      PHYSICAL EXAMINATION:  There were no vitals taken for this visit.  ECOG PERFORMANCE STATUS: 1  Physical Exam  Constitutional: Oriented to person, place, and time and well-developed, well-nourished, and in no distress.  HENT:  Head: Normocephalic and atraumatic.  Mouth/Throat: Oropharynx is clear and moist. No oropharyngeal exudate.  Eyes: Conjunctivae are normal. Right eye exhibits no discharge. Left eye exhibits no discharge. No scleral icterus.  Neck: Normal range of motion. Neck supple.  Cardiovascular: Normal rate, regular rhythm, normal heart sounds and intact distal pulses.   Pulmonary/Chest: Effort normal. Quiet breath sounds bilaterally. No respiratory distress. No wheezes. No rales.  Abdominal: Soft. Bowel sounds are normal. Exhibits no distension and no mass. There is no tenderness.  Musculoskeletal: Normal range of motion. Exhibits no edema.  Lymphadenopathy:    No cervical adenopathy.  Neurological:  Alert and oriented to person, place, and time. Exhibits normal muscle tone. Gait normal. Coordination normal.  Skin: Skin is warm and dry. No rash noted. Not diaphoretic. No erythema. No pallor.  Psychiatric: Mood, memory and judgment normal.  Vitals reviewed.  LABORATORY DATA: Lab Results  Component Value Date   WBC 7.1 05/29/2024   HGB 12.7 (L) 05/29/2024   HCT 38.3 (L) 05/29/2024   MCV 95.8 05/29/2024   PLT 231 05/29/2024      Chemistry      Component Value Date/Time   NA 139 05/29/2024 0951   K 4.5 05/29/2024 0951   CL 104 05/29/2024 0951   CO2 30 05/29/2024 0951    BUN 18 05/29/2024 0951   CREATININE 1.34 (H) 05/29/2024 0951      Component Value Date/Time   CALCIUM  9.9 05/29/2024 0951   ALKPHOS 63 05/29/2024 0951   AST 15 05/29/2024 0951   ALT 9 05/29/2024 0951   BILITOT 0.6 05/29/2024 0951       RADIOGRAPHIC STUDIES:  CT Chest W Contrast Result Date: 06/02/2024 CLINICAL DATA:  Non-small cell lung cancer, nonmetastatic, assess treatment response. * Tracking Code: BO * EXAM: CT CHEST WITH CONTRAST TECHNIQUE: Multidetector CT imaging of the chest was performed during intravenous contrast administration. RADIATION DOSE REDUCTION: This exam was performed according to the departmental dose-optimization program which includes automated exposure control, adjustment of the mA and/or kV according to patient size and/or use of iterative reconstruction technique. CONTRAST:  75mL OMNIPAQUE  IOHEXOL  300 MG/ML  SOLN COMPARISON:  01/28/2024. FINDINGS: Cardiovascular: Atherosclerotic calcification of the aorta and coronary arteries. Enlarged pulmonic trunk. Heart is at the upper limits of normal in size to mildly enlarged. No pericardial effusion. Mediastinum/Nodes: No pathologically enlarged mediastinal, hilar or axillary lymph nodes. Esophagus is grossly unremarkable. Lungs/Pleura: Post radiation parenchymal retraction in the medial right upper lobe. Centrilobular emphysema. Additional pleuroparenchymal scarring in the lung bases. 6 mm nodule in the anterior segment left upper lobe is seen with an adjacent fiducial marker (5/37), decreased in size from 8 mm on 01/28/2024. No pleural fluid. Debris in the airway. Upper Abdomen: Partially imaged low-attenuation lesions in kidneys. No specific follow-up necessary. Visualized portions of the liver, gallbladder, adrenal glands, kidneys, spleen, pancreas, stomach and bowel are otherwise grossly unremarkable. No upper abdominal adenopathy. Musculoskeletal: Degenerative changes in the spine. IMPRESSION: 1. 6 mm left upper lobe  nodule has decreased minimally in size in the interval, compatible with treatment response. 2. Post radiation scarring in the medial right upper lobe. 3. Aortic atherosclerosis (ICD10-I70.0). Coronary artery calcification. 4. Enlarged pulmonic trunk, indicative of pulmonary arterial hypertension. 5.  Emphysema (ICD10-J43.9). Electronically Signed   By: Newell Eke M.D.   On: 06/02/2024 13:52     ASSESSMENT/PLAN:  This is a very pleasant 73 year-old Caucasian male diagnosed initially with: 1)  stage Ia (T1b, N0, M0) non-small cell lung cancer, squamous cell carcinoma.  The patient presented with a right upper lobe lung nodule in March 2022.  He completed SBRT under the care of Dr. Dewey.  2) stage I small cell lung cancer, squamous cell carcinoma of the left upper lobe which was also treated with SBRT in August 2025 by Dr. Dewey.   The patient was seen with Dr. Sherrod today.  Dr. Sherrod personally and independently reviewed the scan and discussed results with the patient today.  The scan showed no evidence of disease progression.  Dr. Sherrod recommends he continue on observation.   I will arrange for repeat CT  scan of the chest in 6 months.  We will see the patient back 1 week later to review the results in the office.  The patient was advised to call immediately if he has any concerning symptoms in the interval. The patient voices understanding of current disease status and treatment options and is in agreement with the current care plan. All questions were answered. The patient knows to call the clinic with any problems, questions or concerns. We can certainly see the patient much sooner if necessary    No orders of the defined types were placed in this encounter.    Luis Crayton L Daeveon Zweber, PA-C 06/02/24  ADDENDUM: Hematology/Oncology Attending:  I had a face-to-face encounter with the patient today.  I reviewed his record, lab, scan and recommended his care plan.  This is a  very pleasant 73 years old white male with stage Ia non-small cell lung cancer, squamous cell carcinoma diagnosed in March 2022 status post SBRT at that time.  He also has another stage Ia non-small cell lung cancer involving the left upper lobe diagnosed in July 2025 status post SBRT in August 2025.  The patient is currently on observation.  He is feeling fine with no concerning complaints.  He had repeat CT scan of the chest performed recently.  I personally independently reviewed the scan and discussed the result with the patient today.  His scan showed no concerning findings for disease progression and there is mild development of the acute left upper lobe lung lesion. I recommended for him to continue on observation with repeat CT scan of the chest in 6 months. The patient was advised to call immediately if he has any concerning symptoms in the interval. Disclaimer: This note was dictated with voice recognition software. Similar sounding words can inadvertently be transcribed and may be missed upon review. Sherrod MARLA Sherrod, MD

## 2024-06-05 ENCOUNTER — Ambulatory Visit: Admitting: Physician Assistant

## 2024-06-05 VITALS — BP 138/78 | HR 57 | Temp 98.3°F | Resp 17 | Ht 72.0 in | Wt 219.0 lb

## 2024-06-05 DIAGNOSIS — C3411 Malignant neoplasm of upper lobe, right bronchus or lung: Secondary | ICD-10-CM | POA: Diagnosis not present

## 2024-06-05 DIAGNOSIS — C3412 Malignant neoplasm of upper lobe, left bronchus or lung: Secondary | ICD-10-CM | POA: Diagnosis not present

## 2024-12-03 ENCOUNTER — Other Ambulatory Visit (HOSPITAL_COMMUNITY)

## 2024-12-03 ENCOUNTER — Inpatient Hospital Stay

## 2024-12-11 ENCOUNTER — Inpatient Hospital Stay: Admitting: Internal Medicine
